# Patient Record
Sex: Female | Born: 1965
Health system: Southern US, Community
[De-identification: ages and names within clinical notes are randomized; demographics above are authoritative.]

## PROBLEM LIST (undated history)

## (undated) DIAGNOSIS — F419 Anxiety disorder, unspecified: Secondary | ICD-10-CM

## (undated) DIAGNOSIS — M1990 Unspecified osteoarthritis, unspecified site: Secondary | ICD-10-CM

## (undated) DIAGNOSIS — R519 Headache, unspecified: Secondary | ICD-10-CM

## (undated) DIAGNOSIS — J309 Allergic rhinitis, unspecified: Secondary | ICD-10-CM

## (undated) DIAGNOSIS — M549 Dorsalgia, unspecified: Secondary | ICD-10-CM

## (undated) DIAGNOSIS — G8929 Other chronic pain: Secondary | ICD-10-CM

## (undated) DIAGNOSIS — K649 Unspecified hemorrhoids: Secondary | ICD-10-CM

## (undated) DIAGNOSIS — IMO0002 Reserved for concepts with insufficient information to code with codable children: Secondary | ICD-10-CM

## (undated) DIAGNOSIS — R51 Headache: Secondary | ICD-10-CM

## (undated) DIAGNOSIS — R112 Nausea with vomiting, unspecified: Secondary | ICD-10-CM

## (undated) DIAGNOSIS — R030 Elevated blood-pressure reading, without diagnosis of hypertension: Secondary | ICD-10-CM

## (undated) DIAGNOSIS — M2550 Pain in unspecified joint: Secondary | ICD-10-CM

## (undated) DIAGNOSIS — E079 Disorder of thyroid, unspecified: Secondary | ICD-10-CM

## (undated) DIAGNOSIS — J302 Other seasonal allergic rhinitis: Secondary | ICD-10-CM

## (undated) DIAGNOSIS — J329 Chronic sinusitis, unspecified: Secondary | ICD-10-CM

## (undated) DIAGNOSIS — Z9889 Other specified postprocedural states: Secondary | ICD-10-CM

## (undated) DIAGNOSIS — I10 Essential (primary) hypertension: Secondary | ICD-10-CM

## (undated) DIAGNOSIS — R011 Cardiac murmur, unspecified: Secondary | ICD-10-CM

## (undated) DIAGNOSIS — IMO0001 Reserved for inherently not codable concepts without codable children: Secondary | ICD-10-CM

## (undated) DIAGNOSIS — M810 Age-related osteoporosis without current pathological fracture: Secondary | ICD-10-CM

## (undated) DIAGNOSIS — T7840XA Allergy, unspecified, initial encounter: Secondary | ICD-10-CM

## (undated) HISTORY — DX: Essential (primary) hypertension: I10

## (undated) HISTORY — DX: Allergic rhinitis, unspecified: J30.9

## (undated) HISTORY — DX: Anxiety disorder, unspecified: F41.9

## (undated) HISTORY — DX: Reserved for inherently not codable concepts without codable children: IMO0001

## (undated) HISTORY — DX: Age-related osteoporosis without current pathological fracture: M81.0

## (undated) HISTORY — DX: Disorder of thyroid, unspecified: E07.9

## (undated) HISTORY — DX: Chronic sinusitis, unspecified: J32.9

## (undated) HISTORY — DX: Allergy, unspecified, initial encounter: T78.40XA

## (undated) HISTORY — DX: Elevated blood-pressure reading, without diagnosis of hypertension: R03.0

---
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
## 1998-04-15 ENCOUNTER — Ambulatory Visit (HOSPITAL_COMMUNITY): Admission: RE | Admit: 1998-04-15 | Discharge: 1998-04-15 | Payer: Self-pay | Admitting: Neurosurgery

## 1999-01-26 ENCOUNTER — Other Ambulatory Visit: Admission: RE | Admit: 1999-01-26 | Discharge: 1999-01-26 | Payer: Self-pay | Admitting: Obstetrics and Gynecology

## 2000-01-25 ENCOUNTER — Other Ambulatory Visit: Admission: RE | Admit: 2000-01-25 | Discharge: 2000-01-25 | Payer: Self-pay | Admitting: Obstetrics and Gynecology

## 2000-08-14 ENCOUNTER — Encounter: Admission: RE | Admit: 2000-08-14 | Discharge: 2000-09-20 | Payer: Self-pay | Admitting: Occupational Medicine

## 2001-02-11 ENCOUNTER — Other Ambulatory Visit: Admission: RE | Admit: 2001-02-11 | Discharge: 2001-02-11 | Payer: Self-pay | Admitting: Obstetrics and Gynecology

## 2001-06-19 ENCOUNTER — Encounter: Payer: Self-pay | Admitting: Obstetrics and Gynecology

## 2001-06-19 ENCOUNTER — Encounter: Admission: RE | Admit: 2001-06-19 | Discharge: 2001-06-19 | Payer: Self-pay | Admitting: Obstetrics and Gynecology

## 2002-03-12 ENCOUNTER — Other Ambulatory Visit: Admission: RE | Admit: 2002-03-12 | Discharge: 2002-03-12 | Payer: Self-pay | Admitting: Obstetrics and Gynecology

## 2003-04-13 ENCOUNTER — Other Ambulatory Visit: Admission: RE | Admit: 2003-04-13 | Discharge: 2003-04-13 | Payer: Self-pay | Admitting: Obstetrics and Gynecology

## 2003-08-18 ENCOUNTER — Encounter: Payer: Self-pay | Admitting: Internal Medicine

## 2003-08-18 ENCOUNTER — Encounter: Admission: RE | Admit: 2003-08-18 | Discharge: 2003-08-18 | Payer: Self-pay | Admitting: Internal Medicine

## 2004-05-25 ENCOUNTER — Other Ambulatory Visit: Admission: RE | Admit: 2004-05-25 | Discharge: 2004-05-25 | Payer: Self-pay | Admitting: Obstetrics and Gynecology

## 2005-06-20 ENCOUNTER — Other Ambulatory Visit: Admission: RE | Admit: 2005-06-20 | Discharge: 2005-06-20 | Payer: Self-pay | Admitting: Obstetrics and Gynecology

## 2006-05-05 ENCOUNTER — Emergency Department (HOSPITAL_COMMUNITY): Admission: EM | Admit: 2006-05-05 | Discharge: 2006-05-05 | Payer: Self-pay | Admitting: Family Medicine

## 2007-06-23 ENCOUNTER — Ambulatory Visit: Payer: Self-pay | Admitting: Cardiology

## 2007-06-25 ENCOUNTER — Ambulatory Visit: Payer: Self-pay

## 2007-06-25 ENCOUNTER — Encounter: Payer: Self-pay | Admitting: Cardiology

## 2007-07-21 ENCOUNTER — Ambulatory Visit: Payer: Self-pay | Admitting: Cardiology

## 2008-02-24 ENCOUNTER — Emergency Department (HOSPITAL_COMMUNITY): Admission: EM | Admit: 2008-02-24 | Discharge: 2008-02-24 | Payer: Self-pay | Admitting: Family Medicine

## 2008-04-06 ENCOUNTER — Emergency Department (HOSPITAL_COMMUNITY): Admission: EM | Admit: 2008-04-06 | Discharge: 2008-04-06 | Payer: Self-pay | Admitting: Emergency Medicine

## 2008-04-15 ENCOUNTER — Encounter: Admission: RE | Admit: 2008-04-15 | Discharge: 2008-04-15 | Payer: Self-pay | Admitting: Internal Medicine

## 2010-10-27 ENCOUNTER — Ambulatory Visit: Payer: Self-pay | Admitting: Family Medicine

## 2010-10-27 ENCOUNTER — Telehealth (INDEPENDENT_AMBULATORY_CARE_PROVIDER_SITE_OTHER): Payer: Self-pay | Admitting: *Deleted

## 2010-10-27 DIAGNOSIS — J309 Allergic rhinitis, unspecified: Secondary | ICD-10-CM | POA: Insufficient documentation

## 2010-12-07 NOTE — Assessment & Plan Note (Signed)
Summary: ?SINUS INFECTION/JBB   Vital Signs:  Patient Profile:   45 Years Old Female CC:      Possible Sinus Infection Height:     63.25 inches Weight:      264 pounds BMI:     46.56 O2 Sat:      98 % O2 treatment:    Room Air Temp:     98.1 degrees F oral Pulse rate:   106 / minute Pulse rhythm:   regular Resp:     18 per minute BP sitting:   141 / 99  (right arm)  Pt. in pain?   yes    Location:   Generalized    Intensity:   4    Type:       aching  Vitals Entered By: Levonne Spiller EMT-P (October 27, 2010 12:09 PM)              Is Patient Diabetic? No  Does patient need assistance? Functional Status Self care Ambulation Normal      Current Allergies: ! * SEASONALHistory of Present Illness History from: patient Reason for visit: see chief complaint Chief Complaint: Possible Sinus Infection History of Present Illness: The patient is presenting today because she has been having a severe reaction of her allergies with the high pollen counts in the air.  She has allergies all year long.   The patient says that she is having sinus headaches (frontal and maxillary), postnasal sinus drainage and no fever.  She says that she has had a history of having frequent sinus infections.  She says that she is having sore throat and it has caused her to wake up in the morning. She is having postnasal drainage that has contributed to the sore throat.  She denies abdominal pain. She denies productive cough but has had a mild dry cough spurred by sneezing and runny nose.   She is taking her Zyrtec medication and also taking her Nasocort nasal spray on a daily basis.  The patient is reporting that she is being monitored for her blood pressure and that she has been having an elevated blood pressure but plans to see her [facility] he care provider next month to have it reevaluated. She reports that her PCP told her that she could do a trial of monitoring her sodium intake and losing weight to see if  this would help control her blood pressure. She denies having headaches and chest pain and visual changes. She denies having weakness in the extremities.  REVIEW OF SYSTEMS Constitutional Symptoms       Complains of fatigue.     Denies fever, chills, night sweats, weight loss, and weight gain.  Eyes       Denies change in vision, eye pain, eye discharge, glasses, contact lenses, and eye surgery. Ear/Nose/Throat/Mouth       Complains of frequent runny nose, sinus problems, and sore throat.      Denies hearing loss/aids, change in hearing, ear pain, ear discharge, dizziness, frequent nose bleeds, hoarseness, and tooth pain or bleeding.  Respiratory       Complains of dry cough.      Denies productive cough, wheezing, shortness of breath, asthma, bronchitis, and emphysema/COPD.  Cardiovascular       Denies murmurs, chest pain, and tires easily with exhertion.    Gastrointestinal       Denies stomach pain, nausea/vomiting, diarrhea, constipation, blood in bowel movements, and indigestion. Genitourniary       Denies painful urination,  kidney stones, and loss of urinary control. Neurological       Denies paralysis, seizures, and fainting/blackouts. Musculoskeletal       Denies muscle pain, joint pain, joint stiffness, decreased range of motion, redness, swelling, muscle weakness, and gout.  Skin       Denies bruising, unusual mles/lumps or sores, and hair/skin or nail changes.  Psych       Denies mood changes, temper/anger issues, anxiety/stress, speech problems, depression, and sleep problems.  Past History:  Past Medical History: Chronic-severe Year Long Allergic rhinitis History of frequent sinus infections History of elevated blood pressure Oral contraceptive use  Past Surgical History: Cesarean section x2 in 1994 and 1998  Family History: The patient reports that her father is alive and healthy. She reports that her father is deceased at the age of 72 from a myocardial  infarction and coronary artery disease. He also suffered with diabetes mellitus. The pace reports that her brother is 66 years old and suffering with diabetes mellitus. The patient also reports that she has been evaluated for elevated blood pressures and may be diagnosed with hypertension when she is seen by her primary care provider in January 2012.  Social History: The patient reports no history of tobacco, alcohol or recreational drug use. 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 Physical Exam General appearance: well developed, well nourished, no acute distress, cooperative and pleasant Head: normocephalic, atraumatic Eyes: conjunctivae and lids normal, bilateral Dennie's lines noted Pupils: equal, round, reactive to light Ears: normal, no lesions or deformities, no fluid seen Nasal: bilateral swollen nasal turbinates with erythematous and yellow discharge seen. Oral/Pharynx: dry mucous membranes, tongue normal, posterior pharynx without erythema or exudate Neck: neck supple,  trachea midline, no masses, no cervical adenopathy noted Thyroid: no masses noted no nodules noted. Chest/Lungs: no rales, wheezes, or rhonchi bilateral, breath sounds equal without effort Heart: regular rate and  rhythm, no murmur Abdomen: soft, non-tender without obvious organomegaly Extremities: normal extremities Neurological: grossly intact and non-focal Skin: no obvious rashes or lesions MSE: oriented to time, place, and person Assessment Problems:    Assessed ALLERGIC RHINITIS as deteriorated - Clanford Johnson MD New Problems: ELEVATED BP READING WITHOUT DX HYPERTENSION (ICD-796.2) ? of ACUTE SINUSITIS, UNSPECIFIED (ICD-461.9) ALLERGIC RHINITIS (ICD-477.9)   Patient Education: Patient and/or caregiver instructed in the following: rest, fluids, Tylenol prn. The risks, benefits and possible side effects were clearly explained and discussed with the patient.   The patient verbalized clear understanding.  The patient was given instructions to return if symptoms don't improve, worsen or new changes develop.  If it is not during clinic hours and the patient cannot get back to this clinic then the patient was told to seek medical care at an available urgent care or emergency department.  The patient verbalized understanding.   Demonstrates willingness to comply.  Plan New Medications/Changes: AZITHROMYCIN 250 MG TABS (AZITHROMYCIN) take 2 tabs by mouth on day 1, then 1 tab by mouth daily for 4 days  #6 x 0, 10/27/2010, Standley Dakins MD  Planning Comments:   The patient was given an injection of Depo-Medrol 80 mg in the office today. The patient tolerated the injection very well.  I have strongly encouraged the patient to continue monitoring her blood pressure. I told the patient to be sure and record the blood pressure readings and report them to her primary care provider when she follows up next month. The patient verbalized understanding.  In addition, I recommended that she consider returning to have her blood pressure in our office done at any time that the clinic is open. Also, I would like for the patient to discontinue the use of decongestants as this may contribute to her elevating her blood pressure. The patient verbalized understanding.  in addition, I explained to the patient that she may or may not have an active sinus infection at this time she does have symptoms and if she develops a purulent discharge from the nares or throat and worsening sinus headaches and a bad taste in the mouth then she can go ahead and get the prescription filled for the Z-Pak. Otherwise she would not need to get it filled. Most of these infections resolve without treatment within 10 days. The patient verbalized understanding.  Follow Up: Follow up in 2-3 days if no improvement, Follow up on an as needed basis, Follow up with Primary Physician  The patient and/or caregiver  has been counseled thoroughly with regard to medications prescribed including dosage, schedule, interactions, rationale for use, and possible side effects and they verbalize understanding.  Diagnoses and expected course of recovery discussed and will return if not improved as expected or if the condition worsens. Patient and/or caregiver verbalized understanding.  Prescriptions: AZITHROMYCIN 250 MG TABS (AZITHROMYCIN) take 2 tabs by mouth on day 1, then 1 tab by mouth daily for 4 days  #6 x 0   Entered and Authorized by:   Standley Dakins MD   Signed by:   Standley Dakins MD on 10/27/2010   Method used:   Electronically to        [id number]       [address]       [place], [place]  [address]       Ph: [phone]       Fax: [phone]   RxID:   [id number]   Patient Instructions: 1)  Oral Rehydration Solution: drink 1/2 ounce every 15 minutes. If tolerated afert 1 hour, drink 1 ounce every 15 minutes. As you can tolerate, keep adding 1/2 ounce every 15 minutes, up to a total of 2-4 ounces. Contact the office if unable to tolerate oral solution, if you keep vomiting, or you continue to have signs of dehydration. 2)  Take your antibiotic as prescribed until ALL of it is gone, but stop if you develop a rash or swelling and contact our office as soon as possible. 3)  Acute sinusitis symptoms for less than 10 days are not helped by antibiotics.Use warm moist compresses, and over the counter decongestants ( only as directed). Call if no improvement in 5-7 days, sooner if increasing pain, fever, or new symptoms. 4)  The patient was informed that there is no on-call provider or services available at this clinic during off-hours (when the clinic is closed).  If the patient developed a problem or concern that required immediate attention, the patient was advised to go the the nearest available urgent care or emergency department for medical care.  The patient verbalized  understanding.    5)  Please return if symptoms worsen or don't improve or new changes develop. We will be in the office tomorrow but will be closed Sunday and Monday. We will probably open at 11 AM on Tuesday, November 27.   Medication Administration  Injection # 1:    Medication: Depo- Medrol 80mg     Diagnosis: 477.9    Route: IM    Site: R deltoid  Exp Date: 06/05/2011    Lot #: [id number]    Mfr: PIFZER    Patient tolerated injection without complications    Given by: Levonne Spiller EMT-P (October 27, 2010 12:32 PM)

## 2010-12-07 NOTE — Progress Notes (Signed)
  Phone Note Call from Patient   Summary of Call: The patient was seen earlier today and Dr. Laural Benes called in a [facility] and it is not there.  [phone] Initial call taken by: Rosine Beat,  October 27, 2010 2:52 PM  Follow-up for Phone Call        [facility] to confirming receipt of a Rx for our clinic. They confirmed receipt and filled the Rx. Pt. was contacted with the same information. Follow-up by: Levonne Spiller EMT-P,  October 27, 2010 3:59 PM

## 2011-03-20 NOTE — Assessment & Plan Note (Signed)
[facility] OFFICE NOTE   NAME:Carson, Lindsey JANVIER                         MRN:          [id number]  DATE:06/23/2007                            DOB:          [birth year]    I was asked by Dr. Theressa Millard to consult on Lindsey Carson, a delightful  45 year old married white female, neuro intensive care unit nurse at  [employer], for an episode of chest pressure and 2 episodes of  palpitations on June 23, 2007.   She has never had a previous cardiac history.  While driving on the road  she had a brief episode of fluttering in her chest that lasted a few  seconds.  She felt a little lightheaded.  She did not experience  syncope.  The palpitations felt irregular.   These are not associated with any activity, in fact this is the first  time she has ever had them.   She does walk in her neighborhood at night.  She does not notice any  dyspnea on exertion or any angina.  She has had an episode of some chest  pressure which was nonexertional.  It is fairly brief.  She is concerned  because her father had a coronary event at age 21 and expired.  He did  have diabetes and hypertension.  He also had atrial fibrillation.   PAST MEDICAL HISTORY:  SHE IS CURRENTLY NOT ALLERGIC TO ANY MEDICATIONS.   She is on:  1. Yaz birth control pill daily.  2. Zyrtec 10 mg a day.  3. Nasacort q.a.m.   She does drink one caffeinated beverage a day.  Her lipids were checked  a year ago and were normal by Dr. Earl Gala.  She does exercise with  walking several nights a week.   SURGICAL HISTORY:  1. Two C-sections 1994 and 1998.  2. She has had wisdom teeth extracted in 1986.   FAMILY HISTORY:  Other than the above is negative.   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  She works weekends.  She is married and has 2 children.  She is under some stress.   EXAMINATION:  Blood pressure is 110/78, her pulse is 74 and regular, her  weight is 153.1, she is 5 foot 3.  She is in no acute distress.  Skin is  warm and dry.  Respiratory rate 18 and unlabored.  HEENT:  Unremarkable.  Carotid upstrokes were equal bilaterally without  bruits, there is no JVD.  Thyroid is not enlarged, trachea is midline.  LUNGS:  Clear.  CARDIAC:  PMI is nondisplaced, slightly accentuated S1, normal S2, there  is no murmurs.  ABDOMINAL:  Soft, good bowel sounds, no midline bruit.  There is no  hepatomegaly.  EXTREMITIES:  Reveal no edema, pulses are intact.  NEURO:  Intact.  SKIN:  Unremarkable.   EKG shows normal sinus rhythm with a borderline short PR interval 124  milliseconds.  There is no delta wave.  She does have some ST segment  elevation and T wave inversion in  V1 and V2 with what appears to be a  borderline incomplete right bundle.  Her QTC is normal.   ASSESSMENT:  1. Palpitations, these are spontaneous, short lived, and appear      irregular.  I suspect these are premature atrial contractions or      premature ventricular contractions.  She could have short runs of      supraventricular tachycardia.  Her PR interval is borderline short,      but this is probably not a factor.  2. Chest pressure which is noncardiac in my opinion.   PLAN:  1. A 2D echocardiogram to rule out any structural heart disease.  2. CardioNet monitor.     Thomas C. Daleen Squibb, MD, [facility]  Electronically Signed    TCW/MedQ  DD: 06/23/2007  DT: 06/23/2007  Job #: [id number]   cc:   Theressa Millard, M.D.

## 2011-04-20 ENCOUNTER — Encounter: Payer: Self-pay | Admitting: Cardiovascular Disease

## 2011-08-02 LAB — DIFFERENTIAL
Lymphocytes Relative: 20
Lymphs Abs: 1.3
Neutrophils Relative %: 66

## 2011-08-02 LAB — URINE MICROSCOPIC-ADD ON

## 2011-08-02 LAB — POCT I-STAT, CHEM 8
BUN: 10
Creatinine, Ser: 1
Potassium: 4
Sodium: 138

## 2011-08-02 LAB — URINALYSIS, ROUTINE W REFLEX MICROSCOPIC
Bilirubin Urine: NEGATIVE
Ketones, ur: NEGATIVE
Nitrite: NEGATIVE
Protein, ur: NEGATIVE
Urobilinogen, UA: 0.2

## 2011-08-02 LAB — GC/CHLAMYDIA PROBE AMP, GENITAL
Chlamydia, DNA Probe: NEGATIVE
GC Probe Amp, Genital: NEGATIVE

## 2011-08-02 LAB — POCT PREGNANCY, URINE
Operator id: 27532
Preg Test, Ur: NEGATIVE

## 2011-08-02 LAB — WET PREP, GENITAL
Clue Cells Wet Prep HPF POC: NONE SEEN
Trich, Wet Prep: NONE SEEN

## 2011-08-02 LAB — CBC
HCT: 39.9
Platelets: 224
WBC: 6.3

## 2011-12-28 ENCOUNTER — Other Ambulatory Visit: Payer: Self-pay | Admitting: Obstetrics and Gynecology

## 2011-12-28 DIAGNOSIS — R928 Other abnormal and inconclusive findings on diagnostic imaging of breast: Secondary | ICD-10-CM

## 2011-12-31 ENCOUNTER — Other Ambulatory Visit: Payer: Self-pay | Admitting: Obstetrics and Gynecology

## 2011-12-31 ENCOUNTER — Other Ambulatory Visit: Payer: Self-pay | Admitting: Family Medicine

## 2012-01-10 ENCOUNTER — Ambulatory Visit
Admission: RE | Admit: 2012-01-10 | Discharge: 2012-01-10 | Disposition: A | Discharge status: Home / Self Care | Payer: 59 | Source: Ambulatory Visit | Attending: Obstetrics and Gynecology | Admitting: Obstetrics and Gynecology

## 2012-01-10 DIAGNOSIS — R928 Other abnormal and inconclusive findings on diagnostic imaging of breast: Secondary | ICD-10-CM

## 2012-07-09 ENCOUNTER — Other Ambulatory Visit (HOSPITAL_COMMUNITY): Payer: Self-pay | Admitting: Neurosurgery

## 2012-07-09 DIAGNOSIS — M545 Low back pain: Secondary | ICD-10-CM

## 2012-07-10 ENCOUNTER — Ambulatory Visit (HOSPITAL_COMMUNITY)
Admission: RE | Admit: 2012-07-10 | Discharge: 2012-07-10 | Disposition: A | Discharge status: Home / Self Care | Payer: 59 | Source: Ambulatory Visit | Attending: Neurosurgery | Admitting: Neurosurgery

## 2012-07-10 DIAGNOSIS — M545 Low back pain: Secondary | ICD-10-CM

## 2012-07-10 DIAGNOSIS — M5126 Other intervertebral disc displacement, lumbar region: Secondary | ICD-10-CM | POA: Insufficient documentation

## 2012-07-18 ENCOUNTER — Other Ambulatory Visit: Payer: Self-pay | Admitting: Neurosurgery

## 2012-07-25 ENCOUNTER — Observation Stay (HOSPITAL_COMMUNITY)
Admission: EM | Admit: 2012-07-25 | Discharge: 2012-07-25 | Disposition: A | Discharge status: Home / Self Care | Payer: 59 | Attending: Emergency Medicine | Admitting: Emergency Medicine

## 2012-07-25 ENCOUNTER — Observation Stay (HOSPITAL_COMMUNITY): Payer: 59

## 2012-07-25 ENCOUNTER — Encounter (HOSPITAL_COMMUNITY): Payer: Self-pay | Admitting: *Deleted

## 2012-07-25 DIAGNOSIS — M79609 Pain in unspecified limb: Secondary | ICD-10-CM | POA: Insufficient documentation

## 2012-07-25 DIAGNOSIS — M25569 Pain in unspecified knee: Secondary | ICD-10-CM | POA: Insufficient documentation

## 2012-07-25 DIAGNOSIS — G8929 Other chronic pain: Secondary | ICD-10-CM | POA: Insufficient documentation

## 2012-07-25 DIAGNOSIS — R262 Difficulty in walking, not elsewhere classified: Secondary | ICD-10-CM | POA: Insufficient documentation

## 2012-07-25 DIAGNOSIS — M549 Dorsalgia, unspecified: Principal | ICD-10-CM | POA: Insufficient documentation

## 2012-07-25 HISTORY — DX: Reserved for concepts with insufficient information to code with codable children: IMO0002

## 2012-07-25 MED ORDER — ONDANSETRON HCL 4 MG/2ML IJ SOLN: 4.0000 mg | Freq: Four times a day (QID) | INTRAMUSCULAR | Status: DC | PRN

## 2012-07-25 MED ORDER — HYDROMORPHONE HCL PF 1 MG/ML IJ SOLN: 1.0000 mg | Freq: Four times a day (QID) | INTRAMUSCULAR | Status: DC | PRN

## 2012-07-25 MED ORDER — DIAZEPAM 5 MG/ML IJ SOLN: 5.0000 mg | Freq: Four times a day (QID) | INTRAMUSCULAR | Status: DC | PRN

## 2012-07-25 MED ORDER — KETOROLAC TROMETHAMINE 30 MG/ML IJ SOLN
30.0000 mg | Freq: Four times a day (QID) | INTRAMUSCULAR | Status: DC | PRN
  Administered 2012-07-25: 30 mg via INTRAVENOUS
  Filled 2012-07-25: qty 1

## 2012-07-25 MED ORDER — ACETAMINOPHEN 325 MG PO TABS
650.0000 mg | ORAL_TABLET | ORAL | Status: DC | PRN
  Administered 2012-07-25: 650 mg via ORAL
  Filled 2012-07-25: qty 2

## 2012-07-25 NOTE — ED Provider Notes (Signed)
830 am pt alert , ambulatory, gcs 15, appears in NAD. Plan f/u dr Dutch Quint as out pt. Aleve . suggest bp recheck 3 weeks  Doug Sou, MD 07/25/12 [phone]

## 2012-07-25 NOTE — ED Notes (Signed)
Pt has L5/S1 fracture, on OR schedule for November.  Woke at 2 am this morning with extreme back and knee pain.  Took Aleve and Ultram at 3 with some relief.

## 2012-07-25 NOTE — ED Notes (Signed)
Brief reports was given to Damon, RN in CDU.

## 2012-07-25 NOTE — ED Notes (Signed)
Neurosurgery has been in to see pt .

## 2012-07-25 NOTE — Consult Note (Signed)
46 year old female well-known to me. He follows for a focal left-sided L5-S1 disc herniation with resultant left-sided radiculopathy. She has been managed conservatively to date but is on the schedule for microdiscectomy in early November. In order to keep working and functioning the patient was recently prescribed her second steroid Dosepak. She stopped taking these on Wednesday. This morning around 2 AM she awakened from sleep with the acute onset of severe bilateral knee pain. The pain was not associated with any numbness or weakness. She had no other joints which were hurting. Her back pain was moderately worse her radicular symptoms in her left buttocks and posterior thigh were unchanged. She tried taking Aleve but got no immediate relief. She presented to the emergency room for further evaluation and care. Over the past few hours the pain has subsided completely. Her knees now feel normal. She has a persistent left buttocks and lower extremity pain which is unchanged. Her back pain is reasonably well controlled. She's been up and Lipitor he and has been able to void.  On exam she is awake and alert she is oriented and appropriate. She does not appear toxic. She is afebrile. Supple. Straight leg raising is moderately positive on the left negative on the right. Motor examination reveals trace weakness of her left-sided gastric venous muscle otherwise motor strength intact. Deep tendon versus are normal active. There is no evidence of long track signs. Chest and abdomen benign.  Patient had a followup MRI scan early this morning. This is completely unchanged from her previous scan and once again demonstrates a small leftward L5-S1 disc herniation. There is no evidence of infection hematoma or new disc rupture within either her lower thoracic spine or lumbar spine.  No evidence that her knee pain was directly coming from her L5-S1 disc herniation. The only supposition I would have at this point is that she  suffered acute inflammatory arthropathy of involving her knees secondary to steroid withdrawal. At this point out like a manager with nonsteroidal anti-inflammatory medications and I have advised her to stay on Aleve 2 pills twice a day for the next 2 weeks. I feel that she is okay for discharge home. She will call me if she is any new difficulty.

## 2012-07-25 NOTE — ED Provider Notes (Signed)
History     CSN: [id number]  Arrival date & time 07/25/12  0400   First MD Initiated Contact with Patient 07/25/12 [phone]      Chief Complaint  Patient presents with  . Back Pain  . Knee Pain    (Consider location/radiation/quality/duration/timing/severity/associated sxs/prior treatment) HPI HX per PT.  LBP for the last few months followed by [facility] Dr Dutch Quint and has planned surgical intervention in November.  Has been able to continue working with pain.  Pain is mostly L sided and has associated L foot numbness intermittently. No known recent trauma or bending/ twisting.    Woke around 2 am with radiating pain to bilateral groin and bilateral knees. Pain much more severe than her chronic pain.  No h/o symptoms like this. Initially unable to walk due to pain.  Called [facility] on call and discussed this with Dr Gerlene Fee who recommended that she come to the ED and have another MRI.  PT took home meds and presents here with pain not as severe as it was initially. No F/C, no urinary/ bowel incontinence. No rash, muscle spasm, weakness or numbness.  Past Medical History  Diagnosis Date  . Chronic allergic rhinitis     Severe, year long  . Sinus infection     History of frequent  . Elevated blood pressure     History of  . OCP (oral contraceptive pills) initiation     Use  . Spinal fracture     L5/S1    Past Surgical History  Procedure Date  . Cesarean section 1994 and 1998    x2    Family History  Problem Relation Age of Onset  . Heart attack Father     MI  . Coronary artery disease Father   . Diabetes Father   . Diabetes Brother     History  Substance Use Topics  . Smoking status: Never Smoker   . Smokeless tobacco: Not on file  . Alcohol Use: No    OB History    Grav Para Term Preterm Abortions TAB SAB Ect Mult Living                  Review of Systems  Constitutional: Negative for fever and chills.  HENT: Negative for neck pain and neck stiffness.   Eyes: Negative  for pain.  Respiratory: Negative for shortness of breath.   Cardiovascular: Negative for chest pain.  Gastrointestinal: Negative for abdominal pain.  Genitourinary: Negative for dysuria.  Musculoskeletal: Positive for back pain.  Skin: Negative for rash.  Neurological: Negative for headaches.  All other systems reviewed and are negative.    Allergies  Review of patient's allergies indicates no known allergies.  Home Medications   Current Outpatient Rx  Name Route Sig Dispense Refill  . CETIRIZINE HCL 10 MG PO TABS Oral Take 10 mg by mouth daily.      Marland Kitchen NAPROXEN SODIUM 220 MG PO TABS Oral Take 660 mg by mouth 2 (two) times daily with a meal.     . NORETHIN ACE-ETH ESTRAD-FE 1-20 MG-MCG(24) PO TABS Oral Take 1 tablet by mouth daily.      Marland Kitchen RANITIDINE HCL 150 MG PO TABS Oral Take 150 mg by mouth 2 (two) times daily.    . TRAMADOL HCL 50 MG PO TABS Oral Take 50 mg by mouth every 6 (six) hours as needed.    . TRIAMCINOLONE ACETONIDE 55 MCG/ACT NA INHA Nasal Place 2 sprays into the nose daily.  BP 162/96  Pulse 113  Temp 99.3 F (37.4 C) (Oral)  Resp 20  SpO2 97%  Physical Exam  Nursing note and vitals reviewed. Constitutional: She is oriented to person, place, and time. She appears well-developed and well-nourished.  HENT:  Head: Normocephalic and atraumatic.  Eyes: EOM are normal. Pupils are equal, round, and reactive to light.  Neck: Neck supple.  Cardiovascular: Regular rhythm, normal heart sounds and intact distal pulses.   Pulmonary/Chest: Effort normal. No respiratory distress.  Musculoskeletal: Normal range of motion. She exhibits no edema.       TTP L paralumbar, no midline defomity. Equal LE DTRs, sensorium to light touch and dorsi/ plantar flexion, hip flexion and knee extension.  Neurological: She is alert and oriented to person, place, and time.  Skin: Skin is warm and dry.    ED Course  Procedures (including critical care time)  MRI for severe LBP  radiating to groin and BLEs.   Back pain protocol initiated.   7:35 AM MRI pending and PT care Tx to [facility].   MDM          Sunnie Nielsen, MD 07/25/12 [phone]

## 2012-07-25 NOTE — ED Notes (Signed)
Patient states that she would like to wait before taking the Valium and Dilaudid. Patient is now in route to MRI.

## 2012-08-28 ENCOUNTER — Encounter (HOSPITAL_COMMUNITY)
Admission: RE | Admit: 2012-08-28 | Discharge: 2012-08-28 | Disposition: A | Discharge status: Home / Self Care | Payer: 59 | Source: Ambulatory Visit | Attending: Neurosurgery | Admitting: Neurosurgery

## 2012-08-28 ENCOUNTER — Encounter (HOSPITAL_COMMUNITY): Payer: Self-pay

## 2012-08-28 HISTORY — DX: Cardiac murmur, unspecified: R01.1

## 2012-08-28 HISTORY — DX: Unspecified hemorrhoids: K64.9

## 2012-08-28 HISTORY — DX: Other chronic pain: G89.29

## 2012-08-28 HISTORY — DX: Dorsalgia, unspecified: M54.9

## 2012-08-28 HISTORY — DX: Other seasonal allergic rhinitis: J30.2

## 2012-08-28 HISTORY — DX: Pain in unspecified joint: M25.50

## 2012-08-28 LAB — BASIC METABOLIC PANEL
BUN: 10 mg/dL (ref 6–23)
Chloride: 105 mEq/L (ref 96–112)
GFR calc Af Amer: >90 mL/min (ref >90)
Potassium: 3.9 mEq/L (ref 3.5–5.1)
Sodium: 139 mEq/L (ref 135–145)

## 2012-08-28 LAB — SURGICAL PCR SCREEN
MRSA, PCR: NEGATIVE
Staphylococcus aureus: NEGATIVE

## 2012-08-28 LAB — CBC WITH DIFFERENTIAL/PLATELET
Basophils Absolute: 0 10*3/uL (ref 0.0–0.1)
Eosinophils Absolute: 0.1 10*3/uL (ref 0.0–0.7)
Eosinophils Relative: 1 % (ref 0–5)
HCT: 40.8 % (ref 36.0–46.0)
MCH: 30.3 pg (ref 26.0–34.0)
MCHC: 33.8 g/dL (ref 30.0–36.0)
MCV: 89.5 fL (ref 78.0–100.0)
Monocytes Absolute: 0.6 10*3/uL (ref 0.1–1.0)
Platelets: 194 10*3/uL (ref 150–400)
RDW: 13 % (ref 11.5–15.5)

## 2012-08-28 LAB — HCG, SERUM, QUALITATIVE: Preg, Serum: NEGATIVE

## 2012-08-28 LAB — TYPE AND SCREEN: ABO/RH(D): O POS

## 2012-08-28 LAB — ABO/RH: ABO/RH(D): O POS

## 2012-08-28 NOTE — Progress Notes (Addendum)
Saw Dr.Wall 81yrs ago for palpitations  Echo in epic from 2008 Denies ever having a heart cath or stress test  Dr.Osborne with [facility]  Denies ekg or cxr within last yr

## 2012-08-28 NOTE — Pre-Procedure Instructions (Signed)
20 Lindsey Carson  08/28/2012   Your procedure is scheduled on:  Mon, Nov 4 @ 7:30 AM  Report to [facility] at 5:30 AM.  Call this number if you have problems the morning of surgery: [phone]   Remember:   Do not eat food:After Midnight.    Take these medicines the morning of surgery with A SIP OF WATER: Cetirizine(Zyrtec),Ranitidine(Zantac),Tramadol(Ultram),and Triamcinolone(Nasocort)   Do not wear jewelry, make-up or nail polish.  Do not wear lotions, powders, or perfumes. You may wear deodorant.  Do not shave 48 hours prior to surgery.   Do not bring valuables to the hospital.  Contacts, dentures or bridgework may not be worn into surgery.  Leave suitcase in the car. After surgery it may be brought to your room.  For patients admitted to the hospital, checkout time is 11:00 AM the day of discharge.   Patients discharged the day of surgery will not be allowed to drive home.    Special Instructions: Shower using CHG 2 nights before surgery and the night before surgery.  If you shower the day of surgery use CHG.  Use special wash - you have one bottle of CHG for all showers.  You should use approximately 1/3 of the bottle for each shower.   Please read over the following fact sheets that you were given: Pain Booklet, Coughing and Deep Breathing, Blood Transfusion Information, MRSA Information and Surgical Site Infection Prevention

## 2012-09-07 MED ORDER — CEFAZOLIN SODIUM-DEXTROSE 2-3 GM-% IV SOLR
2.0000 g | INTRAVENOUS | Status: AC
  Administered 2012-09-08: 2 g via INTRAVENOUS
  Filled 2012-09-07: qty 50

## 2012-09-08 ENCOUNTER — Encounter (HOSPITAL_COMMUNITY): Payer: Self-pay | Admitting: Anesthesiology

## 2012-09-08 ENCOUNTER — Encounter (HOSPITAL_COMMUNITY)
Admission: RE | Disposition: A | Discharge status: Home / Self Care | Payer: Self-pay | Source: Ambulatory Visit | Attending: Neurosurgery

## 2012-09-08 ENCOUNTER — Encounter (HOSPITAL_COMMUNITY): Payer: Self-pay | Admitting: *Deleted

## 2012-09-08 ENCOUNTER — Ambulatory Visit (HOSPITAL_COMMUNITY)
Admission: RE | Admit: 2012-09-08 | Discharge: 2012-09-08 | Disposition: A | Discharge status: Home / Self Care | Payer: 59 | Source: Ambulatory Visit | Attending: Neurosurgery | Admitting: Neurosurgery

## 2012-09-08 ENCOUNTER — Ambulatory Visit (HOSPITAL_COMMUNITY): Payer: 59

## 2012-09-08 ENCOUNTER — Ambulatory Visit (HOSPITAL_COMMUNITY): Payer: 59 | Admitting: Anesthesiology

## 2012-09-08 DIAGNOSIS — Z01811 Encounter for preprocedural respiratory examination: Secondary | ICD-10-CM | POA: Insufficient documentation

## 2012-09-08 DIAGNOSIS — M5126 Other intervertebral disc displacement, lumbar region: Secondary | ICD-10-CM | POA: Insufficient documentation

## 2012-09-08 DIAGNOSIS — M5116 Intervertebral disc disorders with radiculopathy, lumbar region: Secondary | ICD-10-CM | POA: Diagnosis present

## 2012-09-08 DIAGNOSIS — Z01812 Encounter for preprocedural laboratory examination: Secondary | ICD-10-CM | POA: Insufficient documentation

## 2012-09-08 HISTORY — PX: LUMBAR LAMINECTOMY/DECOMPRESSION MICRODISCECTOMY: SHX5026

## 2012-09-08 SURGERY — LUMBAR LAMINECTOMY/DECOMPRESSION MICRODISCECTOMY 1 LEVEL
Anesthesia: General | Site: Spine Lumbar | Laterality: Left | Wound class: Clean

## 2012-09-08 MED ORDER — ZOLPIDEM TARTRATE 5 MG PO TABS: 5.0000 mg | ORAL_TABLET | Freq: Every evening | ORAL | Status: DC | PRN

## 2012-09-08 MED ORDER — OXYCODONE-ACETAMINOPHEN 5-325 MG PO TABS
1.0000 | ORAL_TABLET | ORAL | Status: DC | PRN
  Administered 2012-09-08: 1 via ORAL
  Filled 2012-09-08: qty 1

## 2012-09-08 MED ORDER — SODIUM CHLORIDE 0.9 % IJ SOLN: 3.0000 mL | INTRAMUSCULAR | Status: DC | PRN

## 2012-09-08 MED ORDER — LACTATED RINGERS IV SOLN
INTRAVENOUS | Status: DC | PRN
  Administered 2012-09-08 (×2): via INTRAVENOUS

## 2012-09-08 MED ORDER — LIDOCAINE HCL (CARDIAC) 20 MG/ML IV SOLN
INTRAVENOUS | Status: DC | PRN
  Administered 2012-09-08: 50 mg via INTRAVENOUS

## 2012-09-08 MED ORDER — PHENYLEPHRINE HCL 10 MG/ML IJ SOLN
INTRAMUSCULAR | Status: DC | PRN
  Administered 2012-09-08: 80 ug via INTRAVENOUS
  Administered 2012-09-08 (×3): 40 ug via INTRAVENOUS

## 2012-09-08 MED ORDER — ONDANSETRON HCL 4 MG/2ML IJ SOLN
INTRAMUSCULAR | Status: DC | PRN
  Administered 2012-09-08: 4 mg via INTRAVENOUS

## 2012-09-08 MED ORDER — FLUTICASONE PROPIONATE 50 MCG/ACT NA SUSP
1.0000 | Freq: Every day | NASAL | Status: DC
  Filled 2012-09-08: qty 16

## 2012-09-08 MED ORDER — HYDROMORPHONE HCL PF 1 MG/ML IJ SOLN
INTRAMUSCULAR | Status: AC
  Filled 2012-09-08: qty 1

## 2012-09-08 MED ORDER — HYDROCODONE-ACETAMINOPHEN 5-325 MG PO TABS: 1.0000 | ORAL_TABLET | ORAL | Status: DC | PRN

## 2012-09-08 MED ORDER — CYCLOBENZAPRINE HCL 10 MG PO TABS: 10.0000 mg | ORAL_TABLET | Freq: Three times a day (TID) | ORAL | Status: DC | PRN

## 2012-09-08 MED ORDER — ONDANSETRON HCL 4 MG/2ML IJ SOLN
INTRAMUSCULAR | Status: AC
  Filled 2012-09-08: qty 2

## 2012-09-08 MED ORDER — FENTANYL CITRATE 0.05 MG/ML IJ SOLN
INTRAMUSCULAR | Status: DC | PRN
  Administered 2012-09-08: 100 ug via INTRAVENOUS
  Administered 2012-09-08 (×2): 50 ug via INTRAVENOUS
  Administered 2012-09-08 (×2): 25 ug via INTRAVENOUS

## 2012-09-08 MED ORDER — KETOROLAC TROMETHAMINE 30 MG/ML IJ SOLN
30.0000 mg | Freq: Four times a day (QID) | INTRAMUSCULAR | Status: DC
  Administered 2012-09-08: 30 mg via INTRAVENOUS
  Filled 2012-09-08: qty 1

## 2012-09-08 MED ORDER — ACETAMINOPHEN 10 MG/ML IV SOLN
INTRAVENOUS | Status: AC
  Filled 2012-09-08: qty 100

## 2012-09-08 MED ORDER — HEMOSTATIC AGENTS (NO CHARGE) OPTIME
TOPICAL | Status: DC | PRN
  Administered 2012-09-08: 1 via TOPICAL

## 2012-09-08 MED ORDER — DEXAMETHASONE SODIUM PHOSPHATE 10 MG/ML IJ SOLN
10.0000 mg | INTRAMUSCULAR | Status: AC
  Administered 2012-09-08: 10 mg via INTRAVENOUS

## 2012-09-08 MED ORDER — NEOSTIGMINE METHYLSULFATE 1 MG/ML IJ SOLN
INTRAMUSCULAR | Status: DC | PRN
  Administered 2012-09-08: 1 mg via INTRAVENOUS
  Administered 2012-09-08: 4 mg via INTRAVENOUS

## 2012-09-08 MED ORDER — ARTIFICIAL TEARS OP OINT
TOPICAL_OINTMENT | OPHTHALMIC | Status: DC | PRN
  Administered 2012-09-08: 1 via OPHTHALMIC

## 2012-09-08 MED ORDER — NORETHIN ACE-ETH ESTRAD-FE 1-20 MG-MCG(24) PO TABS: 1.0000 | ORAL_TABLET | Freq: Every day | ORAL | Status: DC

## 2012-09-08 MED ORDER — ACETAMINOPHEN 10 MG/ML IV SOLN
INTRAVENOUS | Status: DC | PRN
  Administered 2012-09-08: 1000 mg via INTRAVENOUS

## 2012-09-08 MED ORDER — HYDROMORPHONE HCL PF 1 MG/ML IJ SOLN: 0.5000 mg | INTRAMUSCULAR | Status: DC | PRN

## 2012-09-08 MED ORDER — SENNA 8.6 MG PO TABS: 1.0000 | ORAL_TABLET | Freq: Two times a day (BID) | ORAL | Status: DC

## 2012-09-08 MED ORDER — THROMBIN 5000 UNITS EX KIT
PACK | CUTANEOUS | Status: DC | PRN
  Administered 2012-09-08 (×2): 5000 [IU] via TOPICAL

## 2012-09-08 MED ORDER — BUPIVACAINE HCL (PF) 0.25 % IJ SOLN
INTRAMUSCULAR | Status: DC | PRN
  Administered 2012-09-08: 20 mL

## 2012-09-08 MED ORDER — SODIUM CHLORIDE 0.9 % IV SOLN: 250.0000 mL | INTRAVENOUS | Status: DC

## 2012-09-08 MED ORDER — ONDANSETRON HCL 4 MG/2ML IJ SOLN
4.0000 mg | INTRAMUSCULAR | Status: DC | PRN
  Administered 2012-09-08: 4 mg via INTRAVENOUS
  Filled 2012-09-08: qty 2

## 2012-09-08 MED ORDER — GLYCOPYRROLATE 0.2 MG/ML IJ SOLN
INTRAMUSCULAR | Status: DC | PRN
  Administered 2012-09-08: .6 mg via INTRAVENOUS

## 2012-09-08 MED ORDER — ACETAMINOPHEN 325 MG PO TABS: 650.0000 mg | ORAL_TABLET | ORAL | Status: DC | PRN

## 2012-09-08 MED ORDER — ACETAMINOPHEN 650 MG RE SUPP: 650.0000 mg | RECTAL | Status: DC | PRN

## 2012-09-08 MED ORDER — 0.9 % SODIUM CHLORIDE (POUR BTL) OPTIME
TOPICAL | Status: DC | PRN
  Administered 2012-09-08: 1000 mL

## 2012-09-08 MED ORDER — ROCURONIUM BROMIDE 100 MG/10ML IV SOLN
INTRAVENOUS | Status: DC | PRN
  Administered 2012-09-08: 40 mg via INTRAVENOUS

## 2012-09-08 MED ORDER — PHENOL 1.4 % MT LIQD: 1.0000 | OROMUCOSAL | Status: DC | PRN

## 2012-09-08 MED ORDER — CEFAZOLIN SODIUM 1-5 GM-% IV SOLN
1.0000 g | Freq: Three times a day (TID) | INTRAVENOUS | Status: DC
  Administered 2012-09-08: 1 g via INTRAVENOUS
  Filled 2012-09-08 (×2): qty 50

## 2012-09-08 MED ORDER — ONDANSETRON HCL 4 MG/2ML IJ SOLN: 4.0000 mg | Freq: Once | INTRAMUSCULAR | Status: DC | PRN

## 2012-09-08 MED ORDER — ALUM & MAG HYDROXIDE-SIMETH 200-200-20 MG/5ML PO SUSP: 30.0000 mL | Freq: Four times a day (QID) | ORAL | Status: DC | PRN

## 2012-09-08 MED ORDER — SODIUM CHLORIDE 0.9 % IR SOLN
Status: DC | PRN
  Administered 2012-09-08: 09:00:00

## 2012-09-08 MED ORDER — DEXAMETHASONE SODIUM PHOSPHATE 10 MG/ML IJ SOLN
INTRAMUSCULAR | Status: AC
  Filled 2012-09-08: qty 1

## 2012-09-08 MED ORDER — LIDOCAINE HCL 4 % MT SOLN
OROMUCOSAL | Status: DC | PRN
  Administered 2012-09-08: 4 mL via TOPICAL

## 2012-09-08 MED ORDER — MENTHOL 3 MG MT LOZG: 1.0000 | LOZENGE | OROMUCOSAL | Status: DC | PRN

## 2012-09-08 MED ORDER — HYDROMORPHONE HCL PF 1 MG/ML IJ SOLN
0.2500 mg | INTRAMUSCULAR | Status: DC | PRN
  Administered 2012-09-08: 0.25 mg via INTRAVENOUS

## 2012-09-08 MED ORDER — MIDAZOLAM HCL 5 MG/5ML IJ SOLN
INTRAMUSCULAR | Status: DC | PRN
  Administered 2012-09-08 (×2): 1 mg via INTRAVENOUS

## 2012-09-08 MED ORDER — SODIUM CHLORIDE 0.9 % IJ SOLN: 3.0000 mL | Freq: Two times a day (BID) | INTRAMUSCULAR | Status: DC

## 2012-09-08 MED ORDER — ACETAMINOPHEN 10 MG/ML IV SOLN: 1000.0000 mg | Freq: Once | INTRAVENOUS | Status: DC | PRN

## 2012-09-08 MED ORDER — SODIUM CHLORIDE 0.9 % IV SOLN
INTRAVENOUS | Status: AC
  Filled 2012-09-08: qty 500

## 2012-09-08 MED ORDER — BACITRACIN 50000 UNITS IM SOLR
INTRAMUSCULAR | Status: AC
  Filled 2012-09-08: qty 1

## 2012-09-08 MED ORDER — PROPOFOL 10 MG/ML IV BOLUS
INTRAVENOUS | Status: DC | PRN
  Administered 2012-09-08: 170 mg via INTRAVENOUS

## 2012-09-08 MED ORDER — KETOROLAC TROMETHAMINE 30 MG/ML IJ SOLN
INTRAMUSCULAR | Status: DC | PRN
  Administered 2012-09-08: 30 mg via INTRAVENOUS

## 2012-09-08 SURGICAL SUPPLY — 57 items
ADH SKN CLS APL DERMABOND .7 (GAUZE/BANDAGES/DRESSINGS)
ADH SKN CLS LQ APL DERMABOND (GAUZE/BANDAGES/DRESSINGS) ×1
APL SKNCLS STERI-STRIP NONHPOA (GAUZE/BANDAGES/DRESSINGS) ×1
BAG DECANTER FOR FLEXI CONT (MISCELLANEOUS) ×2 IMPLANT
BENZOIN TINCTURE PRP APPL 2/3 (GAUZE/BANDAGES/DRESSINGS) ×2 IMPLANT
BLADE SURG ROTATE 9660 (MISCELLANEOUS) IMPLANT
BRUSH SCRUB EZ PLAIN DRY (MISCELLANEOUS) ×2 IMPLANT
BUR CUTTER 7.0 ROUND (BURR) ×2 IMPLANT
CANISTER SUCTION 2500CC (MISCELLANEOUS) ×2 IMPLANT
CLOTH BEACON ORANGE TIMEOUT ST (SAFETY) ×2 IMPLANT
CONT SPEC 4OZ CLIKSEAL STRL BL (MISCELLANEOUS) ×2 IMPLANT
DECANTER SPIKE VIAL GLASS SM (MISCELLANEOUS) ×2 IMPLANT
DERMABOND ADHESIVE PROPEN (GAUZE/BANDAGES/DRESSINGS) ×1
DERMABOND ADVANCED (GAUZE/BANDAGES/DRESSINGS)
DERMABOND ADVANCED .7 DNX12 (GAUZE/BANDAGES/DRESSINGS) ×1 IMPLANT
DERMABOND ADVANCED .7 DNX6 (GAUZE/BANDAGES/DRESSINGS) IMPLANT
DRAPE LAPAROTOMY 100X72X124 (DRAPES) ×2 IMPLANT
DRAPE MICROSCOPE ZEISS OPMI (DRAPES) ×2 IMPLANT
DRAPE POUCH INSTRU U-SHP 10X18 (DRAPES) ×2 IMPLANT
DRAPE PROXIMA HALF (DRAPES) IMPLANT
DRAPE SURG 17X23 STRL (DRAPES) ×4 IMPLANT
ELECT REM PT RETURN 9FT ADLT (ELECTROSURGICAL) ×2
ELECTRODE REM PT RTRN 9FT ADLT (ELECTROSURGICAL) ×1 IMPLANT
GAUZE SPONGE 4X4 16PLY XRAY LF (GAUZE/BANDAGES/DRESSINGS) IMPLANT
GLOVE BIOGEL PI IND STRL 7.0 (GLOVE) IMPLANT
GLOVE BIOGEL PI IND STRL 8.5 (GLOVE) IMPLANT
GLOVE BIOGEL PI INDICATOR 7.0 (GLOVE) ×2
GLOVE BIOGEL PI INDICATOR 8.5 (GLOVE) ×1
GLOVE ECLIPSE 8.5 STRL (GLOVE) ×3 IMPLANT
GLOVE EXAM NITRILE LRG STRL (GLOVE) IMPLANT
GLOVE EXAM NITRILE MD LF STRL (GLOVE) ×1 IMPLANT
GLOVE EXAM NITRILE XL STR (GLOVE) IMPLANT
GLOVE EXAM NITRILE XS STR PU (GLOVE) IMPLANT
GLOVE SS BIOGEL STRL SZ 6.5 (GLOVE) IMPLANT
GLOVE SUPERSENSE BIOGEL SZ 6.5 (GLOVE) ×2
GOWN BRE IMP SLV AUR LG STRL (GOWN DISPOSABLE) ×1 IMPLANT
GOWN BRE IMP SLV AUR XL STRL (GOWN DISPOSABLE) ×3 IMPLANT
GOWN STRL REIN 2XL LVL4 (GOWN DISPOSABLE) IMPLANT
KIT BASIN OR (CUSTOM PROCEDURE TRAY) ×2 IMPLANT
KIT ROOM TURNOVER OR (KITS) ×2 IMPLANT
NDL SPNL 22GX3.5 QUINCKE BK (NEEDLE) ×1 IMPLANT
NEEDLE HYPO 22GX1.5 SAFETY (NEEDLE) ×2 IMPLANT
NEEDLE SPNL 22GX3.5 QUINCKE BK (NEEDLE) ×2 IMPLANT
NS IRRIG 1000ML POUR BTL (IV SOLUTION) ×2 IMPLANT
PACK LAMINECTOMY NEURO (CUSTOM PROCEDURE TRAY) ×2 IMPLANT
PAD ARMBOARD 7.5X6 YLW CONV (MISCELLANEOUS) ×8 IMPLANT
RUBBERBAND STERILE (MISCELLANEOUS) ×4 IMPLANT
SPONGE GAUZE 4X4 12PLY (GAUZE/BANDAGES/DRESSINGS) ×2 IMPLANT
SPONGE SURGIFOAM ABS GEL SZ50 (HEMOSTASIS) ×2 IMPLANT
STRIP CLOSURE SKIN 1/2X4 (GAUZE/BANDAGES/DRESSINGS) ×2 IMPLANT
SUT VIC AB 2-0 CT1 18 (SUTURE) ×2 IMPLANT
SUT VIC AB 3-0 SH 8-18 (SUTURE) ×2 IMPLANT
SYR 20ML ECCENTRIC (SYRINGE) ×2 IMPLANT
TAPE CLOTH SURG 4X10 WHT LF (GAUZE/BANDAGES/DRESSINGS) ×1 IMPLANT
TOWEL OR 17X24 6PK STRL BLUE (TOWEL DISPOSABLE) ×2 IMPLANT
TOWEL OR 17X26 10 PK STRL BLUE (TOWEL DISPOSABLE) ×2 IMPLANT
WATER STERILE IRR 1000ML POUR (IV SOLUTION) ×2 IMPLANT

## 2012-09-08 NOTE — Anesthesia Postprocedure Evaluation (Signed)
  Anesthesia Post-op Note  Patient: Lindsey Carson  Procedure(s) Performed: Procedure(s) (LRB) with comments: LUMBAR LAMINECTOMY/DECOMPRESSION MICRODISCECTOMY 1 LEVEL (Left) - left Lumbar Five-Sacral one laminectomy/microdiscectomy  Patient Location: PACU  Anesthesia Type:General  Level of Consciousness: awake, alert  and oriented  Airway and Oxygen Therapy: Patient Spontanous Breathing  Post-op Pain: mild  Post-op Assessment: Post-op Vital signs reviewed and Patient's Cardiovascular Status Stable  Post-op Vital Signs: stable  Complications: No apparent anesthesia complications 

## 2012-09-08 NOTE — H&P (Signed)
Lindsey Carson is an 46 y.o. female.   Chief Complaint: Back and left lower extremity pain HPI: 46 year old female with back and left lower extremity pain consistent with a left-sided S1 radiculopathy failing conservative management. Workup demonstrates evidence of a left-sided L5-S1 disc herniation with compression of the left-sided S1 nerve root and to lesser degree the left-sided L5 nerve root as it exited the foramen. Patient's failed conservative management including oral steroids rest and activity modifications. She has no right-sided symptoms. She has no bowel or bladder dysfunction. She presents now for microdiscectomy in hopes of improving her symptoms.  Past Medical History  Diagnosis Date  . Chronic allergic rhinitis     Severe, year long  . Sinus infection     History of frequent  . Elevated blood pressure     History of  . OCP (oral contraceptive pills) initiation     Use  . Spinal fracture     L5/S1  . Heart murmur     was told but doesn't require meds  . Seasonal allergies     uses nasocort daily and zyrtec  . Joint pain     hands and knees  . Chronic back pain     HNP  . Hemorrhoids     Past Surgical History  Procedure Date  . Cesarean section 1994 and 1998    x 2     Family History  Problem Relation Age of Onset  . Heart attack Father     MI  . Coronary artery disease Father   . Diabetes Father   . Diabetes Brother    Social History:  reports that she has never smoked. She does not have any smokeless tobacco history on file. She reports that she does not drink alcohol or use illicit drugs.  Allergies: No Known Allergies  Medications Prior to Admission  Medication Sig Dispense Refill  . cetirizine (ZYRTEC ALLERGY) 10 MG tablet Take 10 mg by mouth daily.        . Homeopathic Products (AZO CONFIDENCE PO) Take 1 tablet by mouth daily.      . Norethin Ace-Eth Estrad-FE (LOESTRIN 24 FE) 1-20 MG-MCG(24) TABS Take 1 tablet by mouth daily.        Marland Kitchen  triamcinolone (NASACORT AQ) 55 MCG/ACT nasal inhaler Place 2 sprays into the nose daily.          No results found for this or any previous visit (from the past 48 hour(s)). No results found.  Review of Systems  Constitutional: Negative.   HENT: Negative.   Eyes: Negative.   Respiratory: Negative.   Cardiovascular: Negative.   Gastrointestinal: Negative.   Genitourinary: Negative.   Musculoskeletal: Negative.   Skin: Negative.   Neurological: Negative.   Endo/Heme/Allergies: Negative.   Psychiatric/Behavioral: Negative.     Blood pressure 155/98, pulse 94, temperature 98.5 F (36.9 C), temperature source Oral, resp. rate 18, SpO2 98.00%. Physical Exam  Constitutional: She is oriented to person, place, and time. She appears well-developed and well-nourished. No distress.  HENT:  Head: Normocephalic and atraumatic.  Right Ear: External ear normal.  Left Ear: External ear normal.  Nose: Nose normal.  Mouth/Throat: Oropharynx is clear and moist.  Eyes: Conjunctivae normal and EOM are normal. Pupils are equal, round, and reactive to light. Right eye exhibits no discharge. Left eye exhibits no discharge.  Neck: Normal range of motion. Neck supple. No tracheal deviation present. No thyromegaly present.  Cardiovascular: Normal rate, regular rhythm, normal heart sounds and  intact distal pulses.  Exam reveals no friction rub.   No murmur heard. Respiratory: Effort normal and breath sounds normal. No respiratory distress. She has no wheezes.  GI: Soft. Bowel sounds are normal. She exhibits no distension. There is no tenderness.  Musculoskeletal: Normal range of motion. She exhibits no edema and no tenderness.  Neurological: She is alert and oriented to person, place, and time. She has normal reflexes. She displays normal reflexes. No cranial nerve deficit. She exhibits normal muscle tone. Coordination normal.       Positive left straight leg raise. Left gastrocnemius 4+ over 5 with mild  wasting. Absent left Achilles reflex.  Skin: Skin is warm and dry. No rash noted. She is not diaphoretic. No erythema. No pallor.  Psychiatric: She has a normal mood and affect. Her behavior is normal. Judgment and thought content normal.     Assessment/Plan Left L5-S1 herniated nucleus pulposus with radiculopathy. Plan left L5-S1 laminotomy and microdiscectomy. Risks and benefits have been explained. Patient wishes to proceed.  Lindsey Carson A 09/08/2012, 7:38 AM

## 2012-09-08 NOTE — Transfer of Care (Signed)
Immediate Anesthesia Transfer of Care Note  Patient: Lindsey Carson  Procedure(s) Performed: Procedure(s) (LRB) with comments: LUMBAR LAMINECTOMY/DECOMPRESSION MICRODISCECTOMY 1 LEVEL (Left) - left Lumbar Five-Sacral one laminectomy/microdiscectomy  Patient Location: PACU  Anesthesia Type:General  Level of Consciousness: awake  Airway & Oxygen Therapy: Patient Spontanous Breathing and Patient connected to nasal cannula oxygen  Post-op Assessment: Report given to PACU RN, Post -op Vital signs reviewed and stable and Patient moving all extremities  Post vital signs: Reviewed and stable  Complications: No apparent anesthesia complications

## 2012-09-08 NOTE — Op Note (Signed)
Date of procedure: 09/08/2012  Date of dictation: Same  Service: Neurosurgery  Preoperative diagnosis: Left L5-S1 herniated nucleus pulposus with radiculopathy  Postoperative diagnosis: Same  Procedure Name: Left L5-S1 laminotomy and microdiscectomy  Surgeon:Gearldean Lomanto A.Slayter Moorhouse, M.D.  Asst. Surgeon: Danielle Dess  Anesthesia: General  Indication: 46 year old female with a back and left lower extremity pain consistent with a left-sided S1 radiculopathy with some elements of the left-sided S1 radiculopathy who has failed conservative measure workup demonstrates evidence of a left-sided L5-S1 disc herniation with compression the left-sided S1 and L5 nerve roots. Patient presents now for microdiscectomy in hopes of improving her symptoms.  Operative note: After induction of anesthesia, patient positioned prone onto Wilson frame and appropriately padded. Lumbar region prepped and draped. Incision made overlying L5-S1 interspace. Subperiosteal dissection performed on the left side. Retractor placed. X-ray taken. Level confirmed. Laminotomy performed utilizing high-speed drill and Kerrison rongeurs. Underlying thecal sac and L5 and S1 nerve roots identified. Microscope brought field these were micro-section. L5 nerve root with a very low takeoff of. Epidural venous plexus coagulated and cut. Thecal sac and S1 nerve root gently mobilized. Disc herniation in the axilla of the L5 nerve root and compressing the proximal S1 nerve root identified. The space and size. Discectomy performed using pituitary rongeurs operative of pituitary rongeurs and Epstein curettes per almost the disc herniation resected. All loose are obvious he jammed his removed and interspace. At this point a very thorough discectomy been achieved. No injury to thecal sac or nerve roots. Wound is then irrigated out solution. Gelfoam was placed topically for hemostasis. Microscope and retractor system were removed. Hemostasis of the muscle achieved with  electrocautery. Wound closed in layers with Vicryl sutures. Steri-Strips sterile dressing applied. No apparent complications. Patient tolerated the procedure well and returned to recovery postop.

## 2012-09-08 NOTE — Discharge Summary (Signed)
Physician Discharge Summary  Patient ID: Lindsey Carson MRN: [id number] DOB/AGE: April 13, 1966 46 y.o.  Admit date: 09/08/2012 Discharge date: 09/08/2012  Admission Diagnoses:  Discharge Diagnoses:  Principal Problem:  *Lumbar disc herniation with radiculopathy   Discharged Condition: good  Hospital Course: Admitted to hospital where she underwent a lumbar laminotomy and microdiscectomy.Marland Kitchen Post op she did well with no pain.   Consults:   Significant Diagnostic Studies:   Treatments:   Discharge Exam: Blood pressure 116/88, pulse 82, temperature 97 F (36.1 C), temperature source Oral, resp. rate 16, SpO2 95.00%. Neuro intact Woudn c/d/i  Disposition: 01-Home or Self Care     Medication List     As of 09/08/2012  4:57 PM    TAKE these medications         AZO CONFIDENCE PO   Take 1 tablet by mouth daily.      cyclobenzaprine 10 MG tablet   Commonly known as: FLEXERIL   Take 1 tablet (10 mg total) by mouth 3 (three) times daily as needed for muscle spasms.      HYDROcodone-acetaminophen 5-325 MG per tablet   Commonly known as: NORCO/VICODIN   Take 1-2 tablets by mouth every 4 (four) hours as needed.      LOESTRIN 24 FE 1-20 MG-MCG(24) tablet   Generic drug: Norethindrone Acetate-Ethinyl Estrad-FE   Take 1 tablet by mouth daily.      NASACORT AQ 55 MCG/ACT nasal inhaler   Generic drug: triamcinolone   Place 2 sprays into the nose daily.      ZYRTEC ALLERGY 10 MG tablet   Generic drug: cetirizine   Take 10 mg by mouth daily.           Follow-up Information    Follow up with Trevor Wilkie A, MD. Call in 1 week.   Contact information:   [address] [place] [address] [phone]          Signed: Julio Sicks A 09/08/2012, 4:57 PM

## 2012-09-08 NOTE — Anesthesia Postprocedure Evaluation (Signed)
  Anesthesia Post-op Note  Patient: Lindsey Carson  Procedure(s) Performed: Procedure(s) (LRB) with comments: LUMBAR LAMINECTOMY/DECOMPRESSION MICRODISCECTOMY 1 LEVEL (Left) - left Lumbar Five-Sacral one laminectomy/microdiscectomy  Patient Location: PACU  Anesthesia Type:General  Level of Consciousness: awake, alert  and oriented  Airway and Oxygen Therapy: Patient Spontanous Breathing  Post-op Pain: mild  Post-op Assessment: Post-op Vital signs reviewed and Patient's Cardiovascular Status Stable  Post-op Vital Signs: stable  Complications: No apparent anesthesia complications

## 2012-09-08 NOTE — Brief Op Note (Signed)
09/08/2012  9:14 AM  PATIENT:  Lindsey Carson  46 y.o. female  PRE-OPERATIVE DIAGNOSIS:  HNP  POST-OPERATIVE DIAGNOSIS:  Herniated nucleous pulposus  PROCEDURE:  Procedure(s) (LRB) with comments: LUMBAR LAMINECTOMY/DECOMPRESSION MICRODISCECTOMY 1 LEVEL (Left) - left Lumbar Five-Sacral one laminectomy/microdiscectomy  SURGEON:  Surgeon(s) and Role:    * Temple Pacini, MD - Primary    * Barnett Abu, MD - Assisting  PHYSICIAN ASSISTANT:   ASSISTANTS:    ANESTHESIA:   general  EBL:  Total I/O In: 1000 [I.V.:1000] Out: -   BLOOD ADMINISTERED:none  DRAINS: none   LOCAL MEDICATIONS USED:  MARCAINE     SPECIMEN:  No Specimen  DISPOSITION OF SPECIMEN:  N/A  COUNTS:  YES  TOURNIQUET:  * No tourniquets in log *  DICTATION: .Dragon Dictation  PLAN OF CARE: Admit for overnight observation  PATIENT DISPOSITION:  PACU - hemodynamically stable.   Delay start of Pharmacological VTE agent (>24hrs) due to surgical blood loss or risk of bleeding: yes

## 2012-09-08 NOTE — Anesthesia Preprocedure Evaluation (Addendum)
Anesthesia Evaluation  Patient identified by MRN, date of birth, ID band Patient awake    Reviewed: Allergy & Precautions, H&P , NPO status , Patient's Chart, lab work & pertinent test results  Airway Mallampati: II TM Distance: >3 FB     Dental  (+) Teeth Intact   Pulmonary  breath sounds clear to auscultation        Cardiovascular Rhythm:Regular Rate:Normal     Neuro/Psych    GI/Hepatic   Endo/Other    Renal/GU      Musculoskeletal   Abdominal   Peds  Hematology   Anesthesia Other Findings   Reproductive/Obstetrics                           Anesthesia Physical Anesthesia Plan  ASA: II  Anesthesia Plan: General   Post-op Pain Management:    Induction: Intravenous  Airway Management Planned: Oral ETT  Additional Equipment:   Intra-op Plan:   Post-operative Plan: Extubation in OR  Informed Consent: I have reviewed the patients History and Physical, chart, labs and discussed the procedure including the risks, benefits and alternatives for the proposed anesthesia with the patient or authorized representative who has indicated his/her understanding and acceptance.     Plan Discussed with: CRNA and Surgeon  Anesthesia Plan Comments: (HNP L5-S1 Allergic Rhinitis  Plan GA with oral ETT  Kipp Brood, MD)       Anesthesia Quick Evaluation

## 2012-09-08 NOTE — Progress Notes (Signed)
Pt doing very well. Pt given D/C instructions with Rx's, verbal understanding given. Pt D/C'd home via wheelchair @ 1730 per MD order. Rema Fendt, RN

## 2012-09-08 NOTE — Anesthesia Procedure Notes (Signed)
Procedure Name: Intubation Date/Time: 09/08/2012 7:50 AM Performed by: Luster Landsberg Pre-anesthesia Checklist: Patient identified, Emergency Drugs available, Suction available, Patient being monitored and Timeout performed Patient Re-evaluated:Patient Re-evaluated prior to inductionOxygen Delivery Method: Circle system utilized Preoxygenation: Pre-oxygenation with 100% oxygen Intubation Type: IV induction Ventilation: Mask ventilation without difficulty Laryngoscope Size: Mac and 3 Grade View: Grade II Tube type: Oral Tube size: 7.0 mm Number of attempts: 1 Airway Equipment and Method: Stylet and LTA kit utilized Placement Confirmation: positive ETCO2,  ETT inserted through vocal cords under direct vision and breath sounds checked- equal and bilateral Secured at: 21 cm Tube secured with: Tape Dental Injury: Teeth and Oropharynx as per pre-operative assessment

## 2012-09-09 ENCOUNTER — Encounter (HOSPITAL_COMMUNITY): Payer: Self-pay | Admitting: Neurosurgery

## 2012-12-20 ENCOUNTER — Other Ambulatory Visit: Payer: Self-pay

## 2013-09-10 ENCOUNTER — Other Ambulatory Visit: Payer: Self-pay

## 2014-01-12 ENCOUNTER — Other Ambulatory Visit: Payer: Self-pay | Admitting: Obstetrics and Gynecology

## 2014-01-12 DIAGNOSIS — R928 Other abnormal and inconclusive findings on diagnostic imaging of breast: Secondary | ICD-10-CM

## 2014-01-29 ENCOUNTER — Ambulatory Visit
Admission: RE | Admit: 2014-01-29 | Discharge: 2014-01-29 | Disposition: A | Discharge status: Home / Self Care | Payer: 59 | Source: Ambulatory Visit | Attending: Obstetrics and Gynecology | Admitting: Obstetrics and Gynecology

## 2014-01-29 DIAGNOSIS — R928 Other abnormal and inconclusive findings on diagnostic imaging of breast: Secondary | ICD-10-CM

## 2014-04-07 ENCOUNTER — Other Ambulatory Visit (HOSPITAL_COMMUNITY): Payer: Self-pay | Admitting: Internal Medicine

## 2014-04-07 DIAGNOSIS — R519 Headache, unspecified: Secondary | ICD-10-CM

## 2014-04-07 DIAGNOSIS — R51 Headache: Principal | ICD-10-CM

## 2014-04-15 ENCOUNTER — Ambulatory Visit (HOSPITAL_COMMUNITY): Admission: RE | Admit: 2014-04-15 | Payer: 59 | Source: Ambulatory Visit

## 2014-04-15 ENCOUNTER — Ambulatory Visit (HOSPITAL_COMMUNITY)
Admission: RE | Admit: 2014-04-15 | Discharge: 2014-04-15 | Disposition: A | Discharge status: Home / Self Care | Payer: 59 | Source: Ambulatory Visit | Attending: Internal Medicine | Admitting: Internal Medicine

## 2014-04-15 DIAGNOSIS — R51 Headache: Secondary | ICD-10-CM | POA: Diagnosis present

## 2014-04-15 DIAGNOSIS — R519 Headache, unspecified: Secondary | ICD-10-CM

## 2014-08-20 ENCOUNTER — Other Ambulatory Visit: Payer: Self-pay

## 2015-09-02 ENCOUNTER — Telehealth: Payer: 59 | Admitting: Neurology

## 2015-09-02 DIAGNOSIS — B9689 Other specified bacterial agents as the cause of diseases classified elsewhere: Secondary | ICD-10-CM

## 2015-09-02 DIAGNOSIS — J019 Acute sinusitis, unspecified: Secondary | ICD-10-CM

## 2015-09-02 MED ORDER — AZITHROMYCIN 250 MG PO TABS: ORAL_TABLET | ORAL | Status: DC

## 2015-09-02 NOTE — Progress Notes (Signed)
We are sorry that you are not feeling well.  Here is how we plan to help!  Based on what you have shared with me it looks like you have sinusitis.  Sinusitis is inflammation and infection in the sinus cavities of the head.  Based on your presentation I believe you most likely have sinusitis. You may continue the medicines you are taking. A Zpack will be sent in electronically. Please allow enough time for the prescription to go through the system and for the pharmacy to fill it. If your medicine is not at the pharmacy after a few hours, please send a note back.  A "nedi pot" is also a good OTC tool to help with sinus congestion and pain. If you should not feel better, or if you get worse, please see your primary care doctor.   Sinus infections are not as easily transmitted as other respiratory infection, however we still recommend that you avoid close contact with loved ones, especially the very young and elderly.  Remember to wash your hands thoroughly throughout the day as this is the number one way to prevent the spread of infection!  Home Care:  Only take medications as instructed by your medical team.  Complete the entire course of an antibiotic.  Do not take these medications with alcohol.  A steam or ultrasonic humidifier can help congestion.  You can place a towel over your head and breathe in the steam from hot water coming from a faucet.  Avoid close contacts especially the very young and the elderly.  Cover your mouth when you cough or sneeze.  Always remember to wash your hands.  Get Help Right Away If:  You develop worsening fever or sinus pain.  You develop a severe head ache or visual changes.  Your symptoms persist after you have completed your treatment plan.  Make sure you  Understand these instructions.  Will watch your condition.  Will get help right away if you are not doing well or get worse.  Your e-visit answers were reviewed by a board certified  advanced clinical practitioner to complete your personal care plan.  Depending on the condition, your plan could have included both over the counter or prescription medications.  If there is a problem please reply  once you have received a response from your provider.  Your safety is important to [place].  If you have drug allergies check your prescription carefully.    You can use MyChart to ask questions about today's visit, request a non-urgent call back, or ask for a work or school excuse for 24 hours related to this e-Visit. If it has been greater than 24 hours you will need to follow up with your provider, or enter a new e-Visit to address those concerns.  You will get an e-mail in the next two days asking about your experience.  I hope that your e-visit has been valuable and will speed your recovery. Thank you for using e-visits.

## 2015-11-24 ENCOUNTER — Ambulatory Visit (INDEPENDENT_AMBULATORY_CARE_PROVIDER_SITE_OTHER): Payer: 59 | Admitting: Nurse Practitioner

## 2015-11-24 ENCOUNTER — Telehealth: Payer: Self-pay | Admitting: Nurse Practitioner

## 2015-11-24 ENCOUNTER — Encounter: Payer: Self-pay | Admitting: Nurse Practitioner

## 2015-11-24 VITALS — BP 112/74 | HR 77 | Temp 98.2°F | Resp 16 | Ht 63.5 in | Wt 168.8 lb

## 2015-11-24 DIAGNOSIS — Z7189 Other specified counseling: Secondary | ICD-10-CM | POA: Diagnosis not present

## 2015-11-24 DIAGNOSIS — R1013 Epigastric pain: Secondary | ICD-10-CM | POA: Diagnosis not present

## 2015-11-24 DIAGNOSIS — Z7689 Persons encountering health services in other specified circumstances: Secondary | ICD-10-CM

## 2015-11-24 LAB — COMPREHENSIVE METABOLIC PANEL
ALBUMIN: 3.8 g/dL (ref 3.5–5.2)
ALK PHOS: 62 U/L (ref 39–117)
ALT: 11 U/L (ref 0–35)
AST: 15 U/L (ref 0–37)
BUN: 18 mg/dL (ref 6–23)
CO2: 26 mEq/L (ref 19–32)
CREATININE: 0.87 mg/dL (ref 0.40–1.20)
Calcium: 9.4 mg/dL (ref 8.4–10.5)
Chloride: 107 mEq/L (ref 96–112)
GFR: 73.32 mL/min (ref >60.00)
Glucose, Bld: 100 mg/dL — ABNORMAL HIGH (ref 70–99)
POTASSIUM: 4 meq/L (ref 3.5–5.1)
Sodium: 140 mEq/L (ref 135–145)
Total Bilirubin: 0.4 mg/dL (ref 0.2–1.2)
Total Protein: 6.3 g/dL (ref 6.0–8.3)

## 2015-11-24 MED ORDER — LOSARTAN POTASSIUM 50 MG PO TABS: 50.0000 mg | ORAL_TABLET | Freq: Every day | ORAL | Status: DC

## 2015-11-24 NOTE — Telephone Encounter (Signed)
Based on provider note it was a Establish care note and not a phyiscal, so I would keep the physical scheduled.

## 2015-11-24 NOTE — Progress Notes (Signed)
Patient ID: Lindsey Carson, female    DOB: 29-Jun-1966  Age: 50 y.o. MRN: [id number]  CC: Establish Care; Nausea; and Abdominal Pain   HPI Lindsey Carson presents for establishing care and CC of epigastric pain and nausea x 2 months.   1) Pt reports 2 months of intermittent  waves of nausea 1 episode of throwing up 1st week of jan.  Gnawing sensation in epigastric area  No ulcer history, denies diarrhea or constipation, denies heart burn  No problems with fatty or fried foods   Treatment to date: Tums, prilosec- somewhat helpful   2) Chronic problems-  Depression- post-partum with only 1 of her 2 daughters, resolved  Allergies- seasonal  HTN- stable on medications   History Lindsey Carson has a past medical history of Chronic allergic rhinitis; Sinus infection; Elevated blood pressure; OCP (oral contraceptive pills) initiation; Spinal fracture; Heart murmur; Seasonal allergies; Joint pain; Chronic back pain; Hemorrhoids; Hypertension; and Depression.   She has past surgical history that includes Cesarean section (1994 and 1998) and Lumbar laminectomy/decompression microdiscectomy (09/08/2012).   Her family history includes Coronary artery disease in her father; Diabetes in her brother and father; Heart attack in her father.She reports that she has never smoked. She does not have any smokeless tobacco history on file. She reports that she does not drink alcohol or use illicit drugs.  Outpatient Prescriptions Prior to Visit  Medication Sig Dispense Refill  . cetirizine (ZYRTEC ALLERGY) 10 MG tablet Take 10 mg by mouth daily.      Marland Kitchen triamcinolone (NASACORT AQ) 55 MCG/ACT nasal inhaler Place 2 sprays into the nose daily.      . cyclobenzaprine (FLEXERIL) 10 MG tablet Take 1 tablet (10 mg total) by mouth 3 (three) times daily as needed for muscle spasms. (Patient not taking: Reported on 11/24/2015) 30 tablet 1  . Norethin Ace-Eth Estrad-FE (LOESTRIN 24 FE) 1-20 MG-MCG(24) TABS Take 1 tablet by mouth daily.  Reported on 11/24/2015    . azithromycin (ZITHROMAX) 250 MG tablet Take 2 pills on day one, then one a day on days 2-5. 6 each 0  . Homeopathic Products (AZO CONFIDENCE PO) Take 1 tablet by mouth daily.    Marland Kitchen HYDROcodone-acetaminophen (NORCO/VICODIN) 5-325 MG per tablet Take 1-2 tablets by mouth every 4 (four) hours as needed. (Patient not taking: Reported on 11/24/2015) 60 tablet 1   No facility-administered medications prior to visit.    ROS Review of Systems  Constitutional: Negative for fever, chills, diaphoresis and fatigue.  Respiratory: Negative for chest tightness, shortness of breath and wheezing.   Cardiovascular: Negative for chest pain, palpitations and leg swelling.  Gastrointestinal: Positive for nausea, vomiting and abdominal pain. Negative for diarrhea.       Epigastric  Skin: Negative for rash.  Neurological: Negative for dizziness and headaches.    Objective:  BP 112/74 mmHg  Pulse 77  Temp(Src) 98.2 F (36.8 C) (Oral)  Resp 16  Ht 5' 3.5" (1.613 m)  Wt 168 lb 12.8 oz (76.567 kg)  BMI 29.43 kg/m2  SpO2 97%  Physical Exam  Constitutional: She is oriented to person, place, and time. She appears well-developed and well-nourished. No distress.  HENT:  Head: Normocephalic and atraumatic.  Right Ear: External ear normal.  Left Ear: External ear normal.  Eyes: EOM are normal. Pupils are equal, round, and reactive to light. Right eye exhibits no discharge. Left eye exhibits no discharge. No scleral icterus.  Cardiovascular: Normal rate, regular rhythm and normal heart sounds.  Exam  reveals no gallop and no friction rub.   No murmur heard. Pulmonary/Chest: Effort normal and breath sounds normal. No respiratory distress. She has no wheezes. She has no rales. She exhibits no tenderness.  Abdominal: Soft. Bowel sounds are normal. She exhibits no distension and no mass. There is no tenderness. There is no rebound and no guarding.  Neurological: She is alert and oriented to  person, place, and time. No cranial nerve deficit. She exhibits normal muscle tone. Coordination normal.  Skin: Skin is warm and dry. No rash noted. She is not diaphoretic.  Psychiatric: She has a normal mood and affect. Her behavior is normal. Judgment and thought content normal.   Assessment & Plan:   Lindsey Carson was seen today for establish care, nausea and abdominal pain.  Diagnoses and all orders for this visit:  Abdominal pain, epigastric -     H. pylori breath test -     Comp Met (CMET)  Encounter to establish care  Other orders -     losartan (COZAAR) 50 MG tablet; Take 1 tablet (50 mg total) by mouth daily.   I have discontinued Lindsey Carson's Homeopathic Products (AZO CONFIDENCE PO), HYDROcodone-acetaminophen, and azithromycin. I have also changed her losartan. Additionally, I am having her maintain her Norethindrone Acetate-Ethinyl Estrad-FE, triamcinolone, cetirizine, cyclobenzaprine, LO LOESTRIN FE, vitamin B-12, and vitamin E.  Meds ordered this encounter  Medications  . DISCONTD: losartan (COZAAR) 50 MG tablet    Sig:     Refill:  5  . LO LOESTRIN FE 1 MG-10 MCG / 10 MCG tablet    Sig:     Refill:  10  . vitamin B-12 (CYANOCOBALAMIN) 1000 MCG tablet    Sig: Take 1,000 mcg by mouth daily.  . vitamin E 400 UNIT capsule    Sig: Take 400 Units by mouth daily.  Marland Kitchen losartan (COZAAR) 50 MG tablet    Sig: Take 1 tablet (50 mg total) by mouth daily.    Dispense:  30 tablet    Refill:  3    Order Specific Question:  Supervising Provider    Answer:  Crecencio Mc [[id number]]     Follow-up: Return if symptoms worsen or fail to improve.

## 2015-11-24 NOTE — Patient Instructions (Signed)
Welcome to [facility]! Nice to see you today.   Please go to the lab for the H.Pylori test and CMET lab draw.   Helicobacter Pylori Antibodies Test WHY AM I HAVING THIS TEST? This is a blood test that looks for bacteria called Helicobacter pylori (H. pylori). H. pylori is a germ that can be found in the cells that line the stomach. Having high levels of H. pylori in your stomach puts you at risk for stomach ulcers and small bowel ulcers, long-term (chronic) inflammation of the lining of the stomach, or even ulcers that may occur in the canal that runs from the mouth to the stomach (esophagus). Presence of H. pylori can also increase your risk for stomach cancer if it is left untreated. Most people with H. pylori in their stomach bacteria have no symptoms. Your health care provider may ask you to have this test if you have symptoms of a stomach ulcer or small bowel ulcer, such as stomach pain before or after eating, heartburn, or nausea repeatedly after eating. WHAT KIND OF SAMPLE IS TAKEN? A blood sample is required for this test. It is usually collected by inserting a needle into a vein or sticking a finger with a small needle. HOW DO I PREPARE FOR THE TEST? There is no preparation required for this test. WHAT ARE THE REFERENCE RANGES? Reference ranges are considered healthy ranges established after testing a large group of healthy people. Reference ranges may vary among different people, labs, and hospitals. It is your responsibility to obtain your test results. Ask the lab or department performing the test when and how you will get your results. Your test results will be reported as positive, negative, or equivocal. Equivocal means that your results are neither positive nor negative. References ranges for these results are as follows:  Less than or equal to 30 units/mL. This is negative.  Greater than or equal to 40 units/mL. This is positive.  30.01-39.99 units/mL. This is equivocal. WHAT DO  THE RESULTS MEAN? Test results that are higher than normal may indicate numerous health conditions. These may include:  Short-term or long-term irritation of the stomach lining (gastritis).  Small bowel ulcer.  Stomach ulcer.  Stomach cancer. Talk with your health care provider to discuss your results, treatment options, and if necessary, the need for more tests. Talk with your health care provider if you have any questions about your results.   This information is not intended to replace advice given to you by your health care provider. Make sure you discuss any questions you have with your health care provider.   Document Released: 11/15/2004 Document Revised: 11/12/2014 Document Reviewed: 03/05/2014 Elsevier Interactive Patient Education [employer].

## 2015-11-24 NOTE — Telephone Encounter (Signed)
Called pt to let her know that she needed to keep the physical appt.Lindsey Carson

## 2015-11-24 NOTE — Telephone Encounter (Signed)
Pt wanted to know if she needed to keep the 2/9 appt for a physical or was it done today.. Please advise pt.. Thanks

## 2015-11-25 DIAGNOSIS — Z7689 Persons encountering health services in other specified circumstances: Secondary | ICD-10-CM | POA: Insufficient documentation

## 2015-11-25 DIAGNOSIS — R1013 Epigastric pain: Secondary | ICD-10-CM | POA: Insufficient documentation

## 2015-11-25 LAB — H. PYLORI BREATH TEST: H. pylori Breath Test: NOT DETECTED

## 2015-11-25 NOTE — Assessment & Plan Note (Signed)
Discussed acute and chronic issues. Reviewed health maintenance measures, PFSHx, and immunizations. Obtain records from previous facility.   

## 2015-11-25 NOTE — Assessment & Plan Note (Signed)
New onset No findings on exam  History suggests gallbladder or possible ulcer etiology Will check a CMET and H. Pylori breath test today FU after results

## 2015-11-28 ENCOUNTER — Encounter: Payer: Self-pay | Admitting: Nurse Practitioner

## 2015-11-30 ENCOUNTER — Encounter: Payer: Self-pay | Admitting: Nurse Practitioner

## 2015-12-01 ENCOUNTER — Other Ambulatory Visit: Payer: Self-pay | Admitting: Nurse Practitioner

## 2015-12-01 DIAGNOSIS — R10816 Epigastric abdominal tenderness: Secondary | ICD-10-CM

## 2015-12-08 ENCOUNTER — Ambulatory Visit
Admission: RE | Admit: 2015-12-08 | Discharge: 2015-12-08 | Disposition: A | Discharge status: Home / Self Care | Payer: 59 | Source: Ambulatory Visit | Attending: Nurse Practitioner | Admitting: Nurse Practitioner

## 2015-12-08 DIAGNOSIS — L821 Other seborrheic keratosis: Secondary | ICD-10-CM | POA: Diagnosis not present

## 2015-12-08 DIAGNOSIS — K769 Liver disease, unspecified: Secondary | ICD-10-CM | POA: Diagnosis not present

## 2015-12-08 DIAGNOSIS — R10816 Epigastric abdominal tenderness: Secondary | ICD-10-CM | POA: Insufficient documentation

## 2015-12-08 DIAGNOSIS — R1013 Epigastric pain: Secondary | ICD-10-CM | POA: Diagnosis not present

## 2015-12-08 DIAGNOSIS — D2271 Melanocytic nevi of right lower limb, including hip: Secondary | ICD-10-CM | POA: Diagnosis not present

## 2015-12-08 DIAGNOSIS — L814 Other melanin hyperpigmentation: Secondary | ICD-10-CM | POA: Diagnosis not present

## 2015-12-08 DIAGNOSIS — D2239 Melanocytic nevi of other parts of face: Secondary | ICD-10-CM | POA: Diagnosis not present

## 2015-12-08 DIAGNOSIS — D2272 Melanocytic nevi of left lower limb, including hip: Secondary | ICD-10-CM | POA: Diagnosis not present

## 2015-12-08 DIAGNOSIS — D1801 Hemangioma of skin and subcutaneous tissue: Secondary | ICD-10-CM | POA: Diagnosis not present

## 2015-12-08 DIAGNOSIS — D225 Melanocytic nevi of trunk: Secondary | ICD-10-CM | POA: Diagnosis not present

## 2015-12-12 ENCOUNTER — Encounter: Payer: Self-pay | Admitting: Nurse Practitioner

## 2015-12-15 ENCOUNTER — Encounter: Payer: Self-pay | Admitting: Nurse Practitioner

## 2015-12-15 ENCOUNTER — Ambulatory Visit (INDEPENDENT_AMBULATORY_CARE_PROVIDER_SITE_OTHER): Payer: 59 | Admitting: Nurse Practitioner

## 2015-12-15 VITALS — BP 102/62 | HR 84 | Temp 98.2°F | Resp 12 | Ht 64.0 in | Wt 169.8 lb

## 2015-12-15 DIAGNOSIS — R1013 Epigastric pain: Secondary | ICD-10-CM

## 2015-12-15 DIAGNOSIS — Z0000 Encounter for general adult medical examination without abnormal findings: Secondary | ICD-10-CM

## 2015-12-15 DIAGNOSIS — L918 Other hypertrophic disorders of the skin: Secondary | ICD-10-CM | POA: Diagnosis not present

## 2015-12-15 NOTE — Progress Notes (Signed)
Pre visit review using our clinic review tool, if applicable. No additional management support is needed unless otherwise documented below in the visit note. 

## 2015-12-15 NOTE — Progress Notes (Signed)
Patient ID: GARY STICKNEY, female    DOB: [birth year]  Age: 50 y.o. MRN: [id number]  CC: CPE   HPI Lindsey Carson presents for annual exam and removal of skin tag.   1) Annual Physical   Diet- no formal  Exercise- no formal  Immunizations- up-to-date  Mammogram- done by OB/GYN  Colonoscopy- will be of age in April  Eye Exam- up-to-date  Dental Exam- up-to-date  LMP- taking birth control  Labs- will obtain  Fall- Neg.   Depression- Neg.  Refills: Denies need for  Skin tag- 40 sec. she is okay with freezing off due to somewhat large stalk  Brother and dad have hemochromatosis- discussed this with patient in regards to ultrasound findings of her liver.  History Lindsey Carson has a past medical history of Chronic allergic rhinitis; Sinus infection; Elevated blood pressure; OCP (oral contraceptive pills) initiation; Spinal fracture; Heart murmur; Seasonal allergies; Joint pain; Chronic back pain; Hemorrhoids; Hypertension; and Depression.   She has past surgical history that includes Cesarean section (1994 and 1998) and Lumbar laminectomy/decompression microdiscectomy (09/08/2012).   Her family history includes Coronary artery disease in her father; Diabetes in her brother and father; Heart attack in her father.She reports that she has never smoked. She does not have any smokeless tobacco history on file. She reports that she does not drink alcohol or use illicit drugs.  Outpatient Prescriptions Prior to Visit  Medication Sig Dispense Refill  . cetirizine (ZYRTEC ALLERGY) 10 MG tablet Take 10 mg by mouth daily.      Marland Kitchen losartan (COZAAR) 50 MG tablet Take 1 tablet (50 mg total) by mouth daily. 30 tablet 3  . Norethin Ace-Eth Estrad-FE (LOESTRIN 24 FE) 1-20 MG-MCG(24) TABS Take 1 tablet by mouth daily. Reported on 11/24/2015    . triamcinolone (NASACORT AQ) 55 MCG/ACT nasal inhaler Place 2 sprays into the nose daily.      . cyclobenzaprine (FLEXERIL) 10 MG tablet Take 1 tablet (10 mg total) by mouth  3 (three) times daily as needed for muscle spasms. (Patient not taking: Reported on 11/24/2015) 30 tablet 1  . LO LOESTRIN FE 1 MG-10 MCG / 10 MCG tablet   10  . vitamin B-12 (CYANOCOBALAMIN) 1000 MCG tablet Take 1,000 mcg by mouth daily.    . vitamin E 400 UNIT capsule Take 400 Units by mouth daily.     No facility-administered medications prior to visit.    ROS Review of Systems  Constitutional: Negative for fever, chills, diaphoresis, fatigue and unexpected weight change.  HENT: Negative for tinnitus and trouble swallowing.   Eyes: Negative for visual disturbance.  Respiratory: Negative for chest tightness, shortness of breath and wheezing.   Cardiovascular: Negative for chest pain, palpitations and leg swelling.  Gastrointestinal: Negative for nausea, vomiting, abdominal pain, diarrhea, constipation and blood in stool.  Endocrine: Negative for polydipsia, polyphagia and polyuria.  Genitourinary: Negative for dysuria, hematuria, vaginal discharge and vaginal pain.  Musculoskeletal: Negative for myalgias, back pain, arthralgias and gait problem.  Skin: Negative for color change and rash.       Skin tags under left breast  Neurological: Negative for dizziness, weakness, numbness and headaches.  Hematological: Does not bruise/bleed easily.  Psychiatric/Behavioral: Negative for suicidal ideas and sleep disturbance. The patient is not nervous/anxious.     Objective:  BP 102/62 mmHg  Pulse 84  Temp(Src) 98.2 F (36.8 C) (Oral)  Resp 12  Ht 5\' 4"  (1.626 m)  Wt 169 lb 12.8 oz (77.021 kg)  BMI 29.13  kg/m2  SpO2 97%  Physical Exam  Constitutional: She is oriented to person, place, and time. She appears well-developed and well-nourished. No distress.  HENT:  Head: Normocephalic and atraumatic.  Right Ear: External ear normal.  Left Ear: External ear normal.  Nose: Nose normal.  Mouth/Throat: Oropharynx is clear and moist. No oropharyngeal exudate.  TMs and canals clear bilaterally   Eyes: Conjunctivae and EOM are normal. Pupils are equal, round, and reactive to light. Right eye exhibits no discharge. Left eye exhibits no discharge. No scleral icterus.  Neck: Normal range of motion. Neck supple. No thyromegaly present.  Cardiovascular: Normal rate, regular rhythm, normal heart sounds and intact distal pulses.  Exam reveals no gallop and no friction rub.   No murmur heard. Pulmonary/Chest: Effort normal and breath sounds normal. No respiratory distress. She has no wheezes. She has no rales. She exhibits no tenderness.  Breast exam deferred to OB/GYN  Abdominal: Soft. Bowel sounds are normal. She exhibits no distension and no mass. There is no tenderness. There is no rebound and no guarding.  Genitourinary:  Pelvic exam deferred to OB/GYN  Musculoskeletal: Normal range of motion. She exhibits no edema or tenderness.  Lymphadenopathy:    She has no cervical adenopathy.  Neurological: She is alert and oriented to person, place, and time. She has normal reflexes. No cranial nerve deficit. She exhibits normal muscle tone. Coordination normal.  Skin: Skin is warm and dry. No rash noted. She is not diaphoretic. No erythema. No pallor.  Large irritated skin tag on the left rib area underneath breast  Psychiatric: She has a normal mood and affect. Her behavior is normal. Judgment and thought content normal.   Assessment & Plan:   Lindsey Carson was seen today for cpe.  Diagnoses and all orders for this visit:  Routine general medical examination at a health care facility -     CBC w/Diff; Future -     Comprehensive metabolic panel; Future -     Lipid panel; Future -     HgB A1c; Future  Skin tag  Abdominal pain, epigastric   I have discontinued Ms. Kontz's cyclobenzaprine, LO LOESTRIN FE, vitamin B-12, and vitamin E. I am also having her maintain her Norethindrone Acetate-Ethinyl Estrad-FE, triamcinolone, cetirizine, and losartan.  No orders of the defined types were placed in  this encounter.     Follow-up: Return if symptoms worsen or fail to improve, for Needs fasting lab appointment.

## 2015-12-15 NOTE — Patient Instructions (Signed)

## 2015-12-20 DIAGNOSIS — Z0000 Encounter for general adult medical examination without abnormal findings: Principal | ICD-10-CM

## 2015-12-20 DIAGNOSIS — L918 Other hypertrophic disorders of the skin: Secondary | ICD-10-CM | POA: Insufficient documentation

## 2015-12-20 DIAGNOSIS — Z0001 Encounter for general adult medical examination with abnormal findings: Secondary | ICD-10-CM | POA: Insufficient documentation

## 2015-12-20 NOTE — Assessment & Plan Note (Signed)
Discussed acute and chronic issues. Reviewed health maintenance measures, PFSHx, and immunizations. Obtain routine labs TSH, Lipid panel, CBC w/ diff, A1c, and CMET.   Health maintenance was made up-to-date

## 2015-12-20 NOTE — Assessment & Plan Note (Signed)
Discussed findings of ultrasound Patient has family history of hemachromatosis, but has never had any signs of it herself We discussed doing a HIDA scan which she will defer until she has an increase in symptoms We will follow

## 2015-12-20 NOTE — Assessment & Plan Note (Signed)
New problem Large irritated skin tag was froze with Histofreeze for 40 seconds advised patient this will fall off over the next few days Band-Aid was placed over site patient tolerated well will follow as needed

## 2015-12-22 DIAGNOSIS — L820 Inflamed seborrheic keratosis: Secondary | ICD-10-CM | POA: Diagnosis not present

## 2015-12-29 ENCOUNTER — Other Ambulatory Visit (INDEPENDENT_AMBULATORY_CARE_PROVIDER_SITE_OTHER): Payer: 59

## 2015-12-29 DIAGNOSIS — R7989 Other specified abnormal findings of blood chemistry: Secondary | ICD-10-CM

## 2015-12-29 DIAGNOSIS — Z0000 Encounter for general adult medical examination without abnormal findings: Secondary | ICD-10-CM | POA: Diagnosis not present

## 2015-12-29 LAB — HEMOGLOBIN A1C: HEMOGLOBIN A1C: 5.5 % (ref 4.6–6.5)

## 2015-12-29 LAB — COMPREHENSIVE METABOLIC PANEL
ALBUMIN: 4 g/dL (ref 3.5–5.2)
ALT: 12 U/L (ref 0–35)
AST: 13 U/L (ref 0–37)
Alkaline Phosphatase: 70 U/L (ref 39–117)
BUN: 18 mg/dL (ref 6–23)
CALCIUM: 10 mg/dL (ref 8.4–10.5)
CO2: 26 mEq/L (ref 19–32)
Chloride: 107 mEq/L (ref 96–112)
Creatinine, Ser: 0.81 mg/dL (ref 0.40–1.20)
GFR: 79.59 mL/min (ref >60.00)
GLUCOSE: 95 mg/dL (ref 70–99)
Potassium: 4.2 mEq/L (ref 3.5–5.1)
Sodium: 139 mEq/L (ref 135–145)
Total Bilirubin: 0.4 mg/dL (ref 0.2–1.2)
Total Protein: 6.4 g/dL (ref 6.0–8.3)

## 2015-12-29 LAB — CBC WITH DIFFERENTIAL/PLATELET
BASOS ABS: 0 10*3/uL (ref 0.0–0.1)
Basophils Relative: 0.5 % (ref 0.0–3.0)
EOS PCT: 2.4 % (ref 0.0–5.0)
Eosinophils Absolute: 0.2 10*3/uL (ref 0.0–0.7)
HEMATOCRIT: 39.9 % (ref 36.0–46.0)
HEMOGLOBIN: 13.5 g/dL (ref 12.0–15.0)
LYMPHS PCT: 20.1 % (ref 12.0–46.0)
Lymphs Abs: 1.4 10*3/uL (ref 0.7–4.0)
MCHC: 33.8 g/dL (ref 30.0–36.0)
MCV: 89.3 fl (ref 78.0–100.0)
MONOS PCT: 8.3 % (ref 3.0–12.0)
Monocytes Absolute: 0.6 10*3/uL (ref 0.1–1.0)
Neutro Abs: 4.9 10*3/uL (ref 1.4–7.7)
Neutrophils Relative %: 68.7 % (ref 43.0–77.0)
Platelets: 214 10*3/uL (ref 150.0–400.0)
RBC: 4.47 Mil/uL (ref 3.87–5.11)
RDW: 12.5 % (ref 11.5–15.5)
WBC: 7.1 10*3/uL (ref 4.0–10.5)

## 2015-12-29 LAB — LIPID PANEL
Cholesterol: 190 mg/dL (ref 0–200)
HDL: 40.4 mg/dL (ref >39.00)
NonHDL: 149.27
TRIGLYCERIDES: 241 mg/dL — AB (ref 0.0–149.0)
Total CHOL/HDL Ratio: 5
VLDL: 48.2 mg/dL — ABNORMAL HIGH (ref 0.0–40.0)

## 2015-12-29 LAB — LDL CHOLESTEROL, DIRECT: Direct LDL: 99 mg/dL

## 2016-01-19 DIAGNOSIS — Z1231 Encounter for screening mammogram for malignant neoplasm of breast: Secondary | ICD-10-CM | POA: Diagnosis not present

## 2016-01-19 DIAGNOSIS — M19011 Primary osteoarthritis, right shoulder: Secondary | ICD-10-CM | POA: Diagnosis not present

## 2016-01-19 DIAGNOSIS — M25511 Pain in right shoulder: Secondary | ICD-10-CM | POA: Diagnosis not present

## 2016-03-31 ENCOUNTER — Telehealth: Payer: 59 | Admitting: Nurse Practitioner

## 2016-03-31 DIAGNOSIS — J0101 Acute recurrent maxillary sinusitis: Secondary | ICD-10-CM

## 2016-03-31 MED ORDER — AZITHROMYCIN 250 MG PO TABS
ORAL_TABLET | ORAL | Status: DC
Start: 2016-03-31 — End: 2016-12-27

## 2016-03-31 NOTE — Progress Notes (Signed)

## 2016-05-03 ENCOUNTER — Telehealth: Payer: Self-pay | Admitting: Family Medicine

## 2016-05-03 MED ORDER — LOSARTAN POTASSIUM 50 MG PO TABS: 50.0000 mg | ORAL_TABLET | Freq: Every day | ORAL | Status: DC

## 2016-05-03 NOTE — Telephone Encounter (Signed)
Refilled. Spoke with patient.

## 2016-05-03 NOTE — Telephone Encounter (Signed)
Pt called stating she needs a refill for losartan (COZAAR) 50 MG tablet. Pt did switch to Dr Caryl Bis.   Pharmacy is [facility], [address]  Call pt @ [phone]. Thank you!

## 2016-07-15 IMAGING — US US ABDOMEN COMPLETE
1 series · 13 of 25 positions shown · non-contrast
Comparison: Patient has a history of prior ultrasound 04/15/2008 CT
04/06/2008. These are unavailable for review.

CLINICAL DATA: Epigastric tenderness.

EXAM:
ABDOMEN ULTRASOUND COMPLETE

[Series 1: us abdomen complete · 0.19mm/px · 13 of 114 slices shown]
[im 1/114]
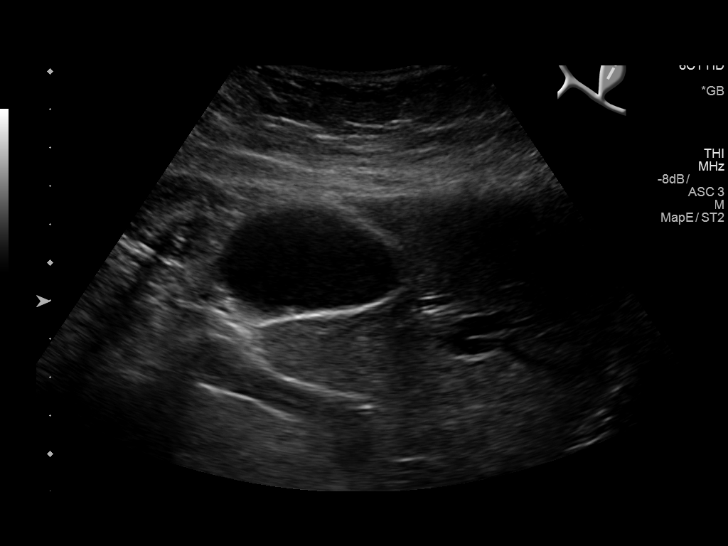
[im 10/114]
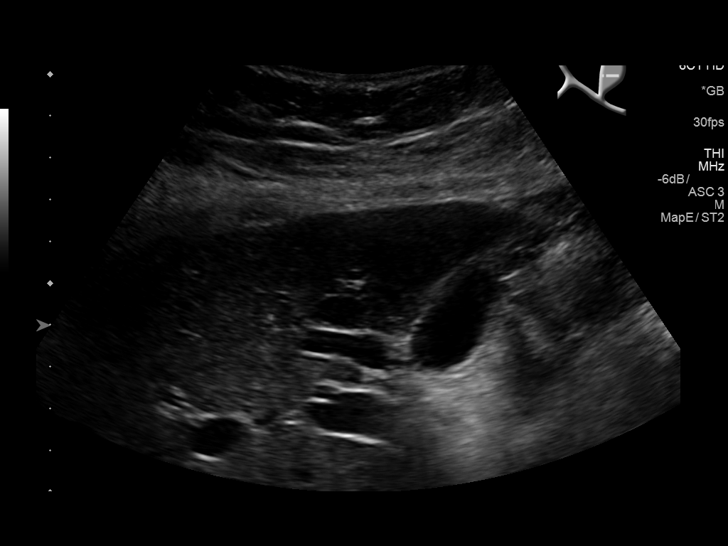
[im 19/114]
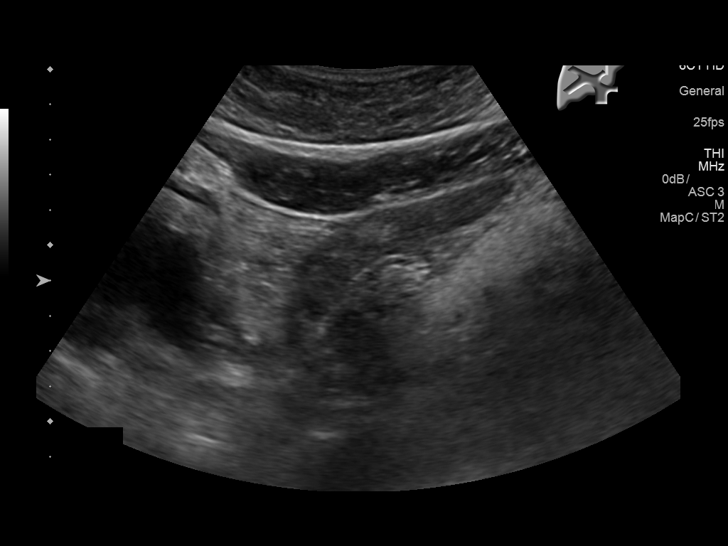
[im 29/114]
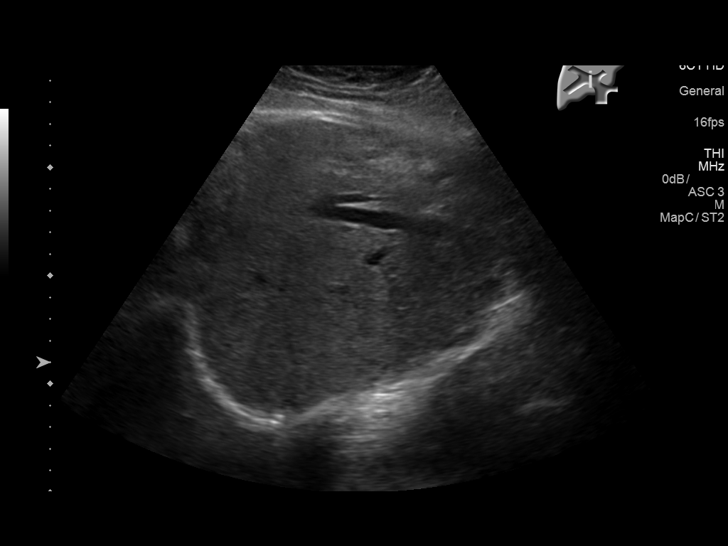
[im 38/114]
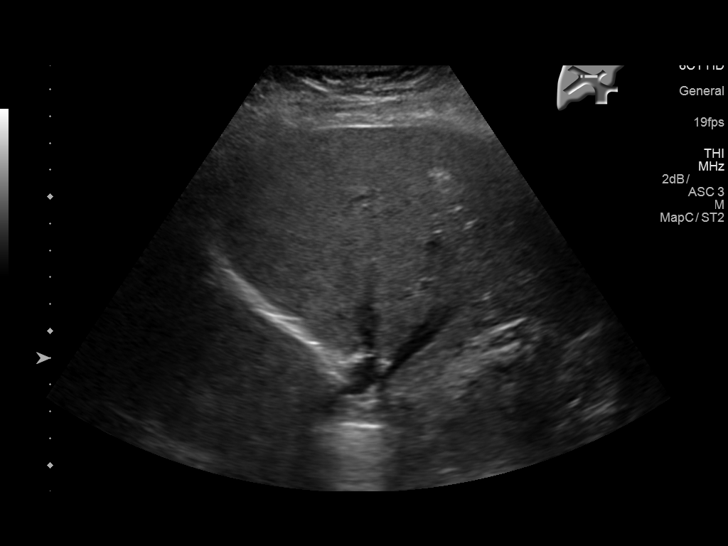
[im 48/114]
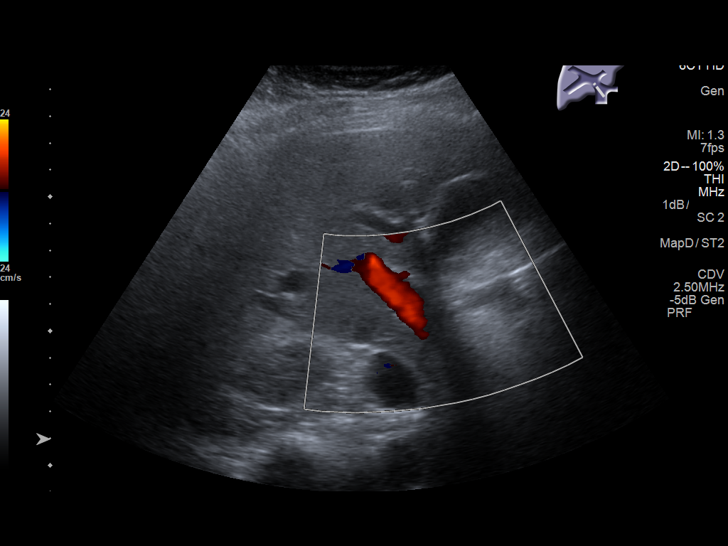
[im 57/114]
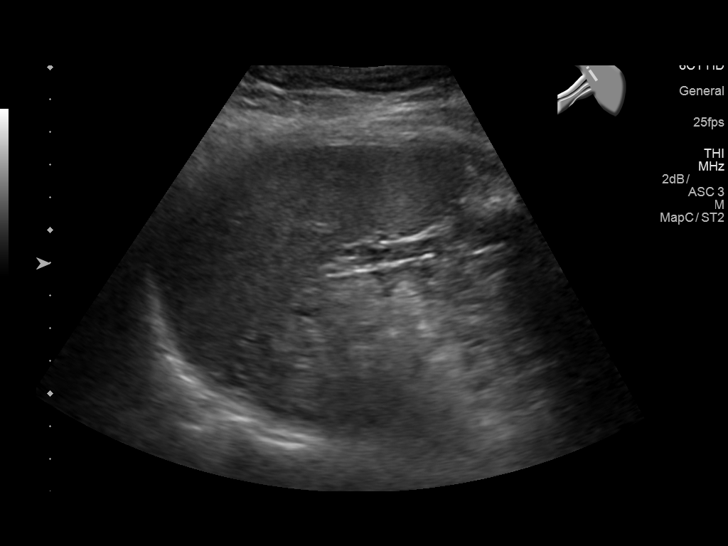
[im 66/114]
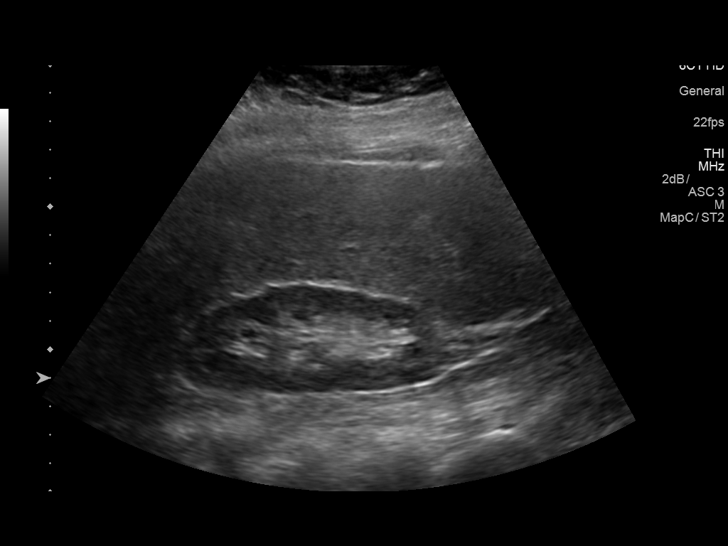
[im 76/114]
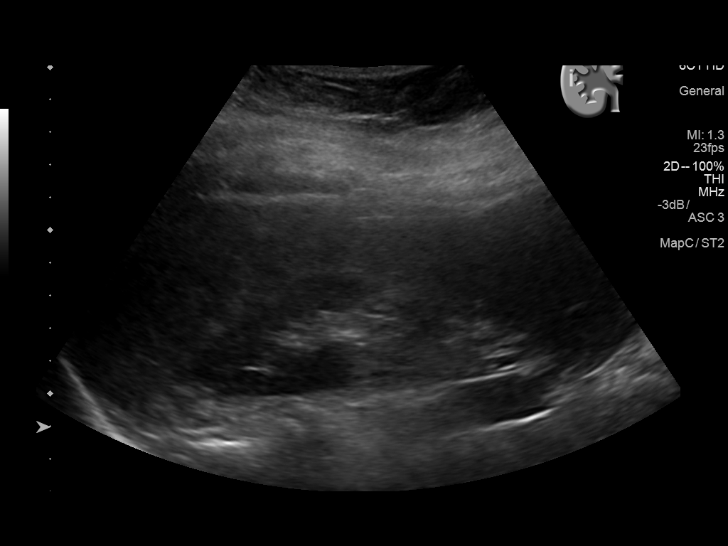
[im 85/114]
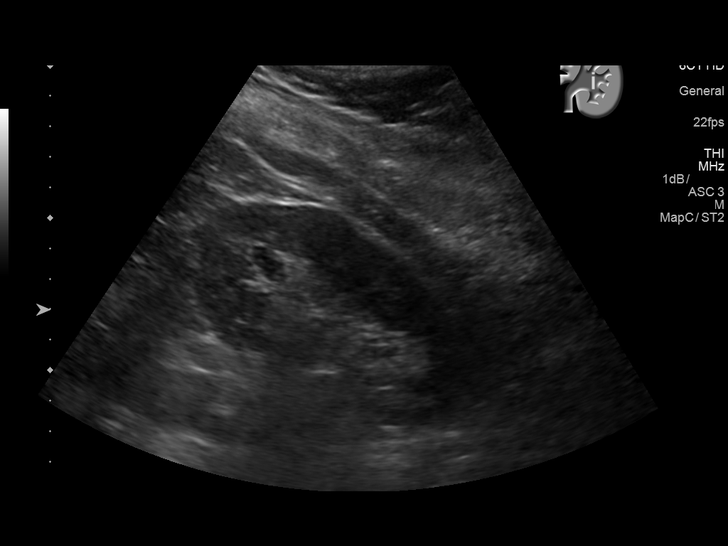
[im 95/114]
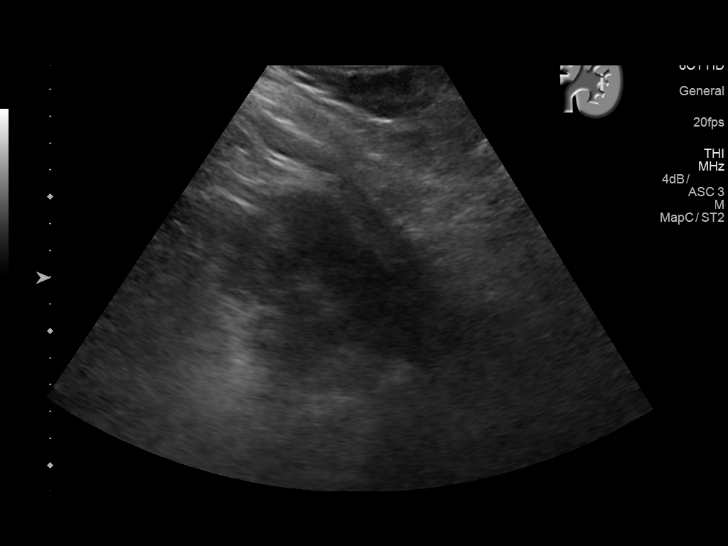
[im 104/114]
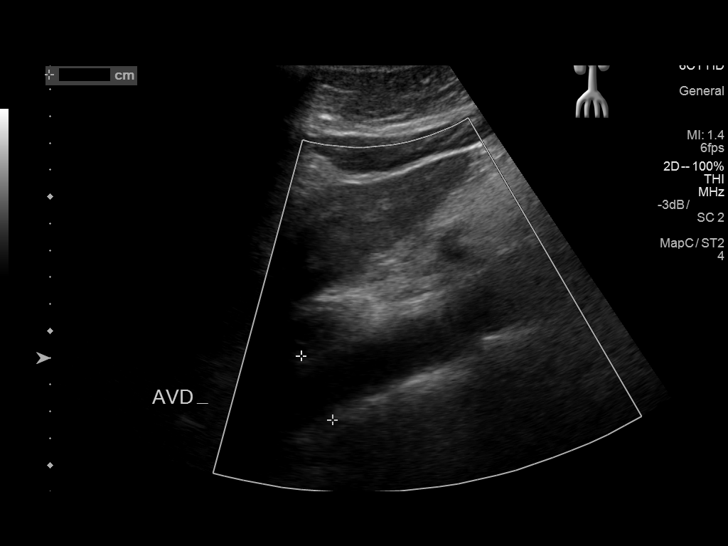
[im 114/114]
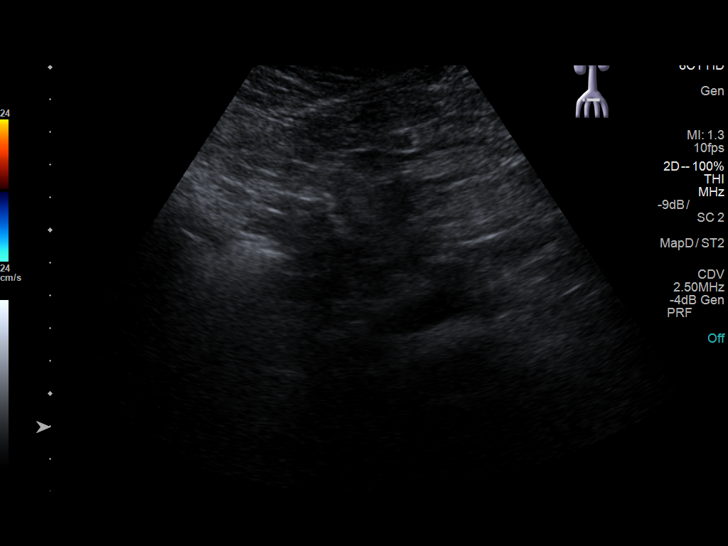

[13 of 25 positions shown; findings below may reference images not displayed]

FINDINGS: Gallbladder: No gallstones or wall thickening visualized. No
sonographic Murphy sign noted by sonographer.

Common bile duct: Diameter: 4 mm

Liver: There is echogenic consistent fatty infiltration and/or
hepatocellular disease. 2.3 cm echogenic focus in the right lobe
liver most likely hemangioma. Confirmation with MRI can be obtained.

IVC: No abnormality visualized.

Pancreas: Visualized portion unremarkable.

Spleen: Size and appearance within normal limits.

Right Kidney: Length: 11.5 cm. Echogenicity within normal limits. No
mass or hydronephrosis visualized.

Left Kidney: Length: 11.5 cm. Echogenicity within normal limits. No
mass or hydronephrosis visualized.

Abdominal aorta: No aneurysm visualized.

Other findings: None.
IMPRESSION: 1. No gallstones or biliary distention.

2. Echogenic liver consistent with fatty infiltration and/or
hepatocellular disease. A 2.3 cm hyperechoic focus is in the right
lobe of liver, most likely a hemangioma. Correlate with prior
reports. If need be MRI of the liver can be obtained for further
evaluation.

## 2016-08-29 ENCOUNTER — Other Ambulatory Visit: Payer: Self-pay | Admitting: Family Medicine

## 2016-11-01 DIAGNOSIS — Z01419 Encounter for gynecological examination (general) (routine) without abnormal findings: Secondary | ICD-10-CM | POA: Diagnosis not present

## 2016-11-01 DIAGNOSIS — Z6830 Body mass index (BMI) 30.0-30.9, adult: Secondary | ICD-10-CM | POA: Diagnosis not present

## 2016-11-29 ENCOUNTER — Other Ambulatory Visit: Payer: Self-pay | Admitting: Family Medicine

## 2016-12-13 DIAGNOSIS — L820 Inflamed seborrheic keratosis: Secondary | ICD-10-CM | POA: Diagnosis not present

## 2016-12-13 DIAGNOSIS — D1801 Hemangioma of skin and subcutaneous tissue: Secondary | ICD-10-CM | POA: Diagnosis not present

## 2016-12-13 DIAGNOSIS — L918 Other hypertrophic disorders of the skin: Secondary | ICD-10-CM | POA: Diagnosis not present

## 2016-12-13 DIAGNOSIS — D2239 Melanocytic nevi of other parts of face: Secondary | ICD-10-CM | POA: Diagnosis not present

## 2016-12-13 DIAGNOSIS — L821 Other seborrheic keratosis: Secondary | ICD-10-CM | POA: Diagnosis not present

## 2016-12-13 DIAGNOSIS — D225 Melanocytic nevi of trunk: Secondary | ICD-10-CM | POA: Diagnosis not present

## 2016-12-27 ENCOUNTER — Encounter: Payer: Self-pay | Admitting: Family Medicine

## 2016-12-27 ENCOUNTER — Ambulatory Visit (INDEPENDENT_AMBULATORY_CARE_PROVIDER_SITE_OTHER): Payer: 59 | Admitting: Family Medicine

## 2016-12-27 VITALS — BP 120/86 | HR 88 | Temp 98.6°F | Ht 63.0 in | Wt 174.0 lb

## 2016-12-27 DIAGNOSIS — J309 Allergic rhinitis, unspecified: Secondary | ICD-10-CM | POA: Diagnosis not present

## 2016-12-27 DIAGNOSIS — I10 Essential (primary) hypertension: Secondary | ICD-10-CM

## 2016-12-27 DIAGNOSIS — R0609 Other forms of dyspnea: Secondary | ICD-10-CM

## 2016-12-27 LAB — COMPREHENSIVE METABOLIC PANEL
ALT: 24 U/L (ref 0–35)
AST: 21 U/L (ref 0–37)
Albumin: 4 g/dL (ref 3.5–5.2)
Alkaline Phosphatase: 80 U/L (ref 39–117)
BILIRUBIN TOTAL: 0.3 mg/dL (ref 0.2–1.2)
BUN: 16 mg/dL (ref 6–23)
CO2: 24 mEq/L (ref 19–32)
Calcium: 9.8 mg/dL (ref 8.4–10.5)
Chloride: 109 mEq/L (ref 96–112)
Creatinine, Ser: 0.8 mg/dL (ref 0.40–1.20)
GFR: 80.42 mL/min (ref >60.00)
Glucose, Bld: 87 mg/dL (ref 70–99)
Potassium: 4 mEq/L (ref 3.5–5.1)
Sodium: 141 mEq/L (ref 135–145)
TOTAL PROTEIN: 6.4 g/dL (ref 6.0–8.3)

## 2016-12-27 LAB — CBC
HCT: 41.7 % (ref 36.0–46.0)
Hemoglobin: 14.2 g/dL (ref 12.0–15.0)
MCHC: 34 g/dL (ref 30.0–36.0)
MCV: 89.3 fl (ref 78.0–100.0)
Platelets: 211 10*3/uL (ref 150.0–400.0)
RBC: 4.67 Mil/uL (ref 3.87–5.11)
RDW: 12.7 % (ref 11.5–15.5)
WBC: 7.3 10*3/uL (ref 4.0–10.5)

## 2016-12-27 LAB — TSH: TSH: 3.54 u[IU]/mL (ref 0.35–4.50)

## 2016-12-27 MED ORDER — MONTELUKAST SODIUM 10 MG PO TABS: 10.0000 mg | ORAL_TABLET | Freq: Every day | ORAL | 3 refills | Status: DC

## 2016-12-27 NOTE — Addendum Note (Signed)
Addended by: Caryl Bis, Graylyn Bunney G on: 12/27/2016 11:08 AM   Modules accepted: Orders

## 2016-12-27 NOTE — Assessment & Plan Note (Addendum)
Relatively well controlled. She'll continue current medications. We will add Singulair.

## 2016-12-27 NOTE — Assessment & Plan Note (Signed)
Patient with several months of exertional dyspnea. Suspect this is related to deconditioning though we will obtain lab work as outlined below to evaluate for other causes. EKG is reassuring. We'll determine next step in management based off of lab work. Patient's given return precautions.

## 2016-12-27 NOTE — Progress Notes (Signed)
Pre visit review using our clinic review tool, if applicable. No additional management support is needed unless otherwise documented below in the visit note. 

## 2016-12-27 NOTE — Patient Instructions (Signed)
Nice to meet you. We will check some lab work today and contact you with the results. If you're allergy symptoms worsen we could consider Singulair. If you develop persistent shortness of breath, or develop chest pain, diaphoresis, or any new or changing symptoms please seek medical attention immediately.

## 2016-12-27 NOTE — Progress Notes (Addendum)
  Tommi Rumps, MD Phone: [phone]  Lindsey Carson is a 51 y.o. female who presents today for f/u.  HYPERTENSION  Disease Monitoring  Home BP Monitoring reports it has been fine at home Chest pain- no    Dyspnea- yes, see below Medications  Compliance-  Taking losartan.  Edema- no  Patient notes some exertional shortness of breath over the last several months. She has started exercising and has noticed this. Does get short of breath with walking and feels this is a little bit out of what she would expect. No chest pain or diaphoresis with this. No orthopnea or PND. She does have a history of SVT though has not felt any palpitations or sudden thumps of pain like she did previously. Notes her father had a history of an MI at 70. She notes her diabetes history. No hyperlipidemia.  Allergic rhinitis: Patient notes this is under better control with Zyrtec and Nasacort though still gets occasional rhinorrhea and watery eyes. Notes she was previously getting a fair number of sinus infections though has not had those in a while.   PMH: nonsmoker.   ROS see history of present illness  Objective  Physical Exam Vitals:   12/27/16 1044  BP: 120/86  Pulse: 88  Temp: 98.6 F (37 C)    BP Readings from Last 3 Encounters:  12/27/16 120/86  12/15/15 102/62  11/24/15 112/74   Wt Readings from Last 3 Encounters:  12/27/16 174 lb (78.9 kg)  12/15/15 169 lb 12.8 oz (77 kg)  11/24/15 168 lb 12.8 oz (76.6 kg)    Physical Exam  Constitutional: No distress.  Cardiovascular: Normal rate, regular rhythm and normal heart sounds.   Pulmonary/Chest: Effort normal and breath sounds normal.  Musculoskeletal: She exhibits no edema.  Neurological: She is alert. Gait normal.  Skin: Skin is warm and dry. She is not diaphoretic.   EKG: Normal sinus rhythm, rate 66, no apparent ischemic changes  Assessment/Plan: Please see individual problem list.  Hypertension At goal. Continue losartan.  Check CMP.  Allergic rhinitis Relatively well controlled. She'll continue current medications. We will add Singulair.  Exertional dyspnea Patient with several months of exertional dyspnea. Suspect this is related to deconditioning though we will obtain lab work as outlined below to evaluate for other causes. EKG is reassuring. We'll determine next step in management based off of lab work. Patient's given return precautions.   Orders Placed This Encounter  Procedures  . Comp Met (CMET)  . CBC  . TSH  . EKG 12-Lead   Tommi Rumps, MD [place]

## 2016-12-27 NOTE — Assessment & Plan Note (Signed)
At goal. Continue losartan. Check CMP. 

## 2016-12-28 ENCOUNTER — Other Ambulatory Visit: Payer: Self-pay | Admitting: Family Medicine

## 2016-12-28 DIAGNOSIS — R0609 Other forms of dyspnea: Principal | ICD-10-CM

## 2017-01-03 ENCOUNTER — Telehealth: Payer: 59 | Admitting: Family

## 2017-01-03 DIAGNOSIS — J329 Chronic sinusitis, unspecified: Secondary | ICD-10-CM

## 2017-01-03 DIAGNOSIS — B9689 Other specified bacterial agents as the cause of diseases classified elsewhere: Secondary | ICD-10-CM | POA: Diagnosis not present

## 2017-01-03 MED ORDER — DOXYCYCLINE HYCLATE 100 MG PO TABS: 100.0000 mg | ORAL_TABLET | Freq: Two times a day (BID) | ORAL | 0 refills | Status: DC

## 2017-01-03 NOTE — Progress Notes (Signed)

## 2017-01-10 ENCOUNTER — Ambulatory Visit (INDEPENDENT_AMBULATORY_CARE_PROVIDER_SITE_OTHER): Payer: 59 | Admitting: Cardiology

## 2017-01-10 ENCOUNTER — Encounter: Payer: Self-pay | Admitting: Cardiology

## 2017-01-10 VITALS — BP 120/82 | HR 100 | Ht 63.0 in | Wt 173.0 lb

## 2017-01-10 DIAGNOSIS — I10 Essential (primary) hypertension: Secondary | ICD-10-CM

## 2017-01-10 DIAGNOSIS — R0602 Shortness of breath: Secondary | ICD-10-CM | POA: Diagnosis not present

## 2017-01-10 MED ORDER — ASPIRIN EC 81 MG PO TBEC: 81.0000 mg | DELAYED_RELEASE_TABLET | Freq: Every day | ORAL | 3 refills | Status: AC

## 2017-01-10 NOTE — Patient Instructions (Addendum)
Medication Instructions:  Your physician has recommended you make the following change in your medication:  1. START aspirin 81 mg once a day   Testing/Procedures: Your physician has requested that you have an echocardiogram. Echocardiography is a painless test that uses sound waves to create images of your heart. It provides your doctor with information about the size and shape of your heart and how well your heart's chambers and valves are working. This procedure takes approximately one hour. There are no restrictions for this procedure.  Your physician has requested that you have a stress echocardiogram. For further information please visit [email or web address]. Please follow instruction sheet as given.   Do not drink or eat foods with caffeine for 24 hours before the test. (Chocolate, coffee, tea, or energy drinks)  If you use an inhaler, bring it with you to the test.  Do not smoke for 4 hours before the test.  Wear comfortable shoes and clothing.  Follow-Up: Your physician recommends that you schedule a follow-up appointment as needed. We will call you with results and if needed schedule follow up at that time.   It was a pleasure seeing you today here in the office. Please do not hesitate to give [place] a call back if you have any further questions. [place], BSN    Echocardiogram An echocardiogram, or echocardiography, uses sound waves (ultrasound) to produce an image of your heart. The echocardiogram is simple, painless, obtained within a short period of time, and offers valuable information to your health care provider. The images from an echocardiogram can provide information such as:  Evidence of coronary artery disease (CAD).  Heart size.  Heart muscle function.  Heart valve function.  Aneurysm detection.  Evidence of a past heart attack.  Fluid buildup around the heart.  Heart muscle thickening.  Assess heart valve function. Tell a health  care provider about:  Any allergies you have.  All medicines you are taking, including vitamins, herbs, eye drops, creams, and over-the-counter medicines.  Any problems you or family members have had with anesthetic medicines.  Any blood disorders you have.  Any surgeries you have had.  Any medical conditions you have.  Whether you are pregnant or may be pregnant. What happens before the procedure? No special preparation is needed. Eat and drink normally. What happens during the procedure?  In order to produce an image of your heart, gel will be applied to your chest and a wand-like tool (transducer) will be moved over your chest. The gel will help transmit the sound waves from the transducer. The sound waves will harmlessly bounce off your heart to allow the heart images to be captured in real-time motion. These images will then be recorded.  You may need an IV to receive a medicine that improves the quality of the pictures. What happens after the procedure? You may return to your normal schedule including diet, activities, and medicines, unless your health care provider tells you otherwise. This information is not intended to replace advice given to you by your health care provider. Make sure you discuss any questions you have with your health care provider. Document Released: 10/19/2000 Document Revised: 06/09/2016 Document Reviewed: 06/29/2013 Elsevier Interactive Patient Education  2017 [employer].  Exercise Stress Echocardiogram An exercise stress echocardiogram is a test that checks how well your heart is working. For this test, you will walk on a treadmill to make your heart beat faster. This test uses sound waves (ultrasound) and a computer to  make pictures (images) of your heart. These pictures will be taken before you exercise and after you exercise. What happens before the procedure?  Follow instructions from your doctor about what you cannot eat or drink before the  test.  Do not drink or eat anything that has caffeine in it. Stop having caffeine for 24 hours before the test.  Ask your doctor about changing or stopping your normal medicines. This is important if you take diabetes medicines or blood thinners. Ask your doctor if you should take your medicines with water before the test.  If you use an inhaler, bring it to the test.  Do not use any products that have nicotine or tobacco in them, such as cigarettes and e-cigarettes. Stop using them for 4 hours before the test. If you need help quitting, ask your doctor.  Wear comfortable shoes and clothing. What happens during the procedure?  You will be hooked up to a TV screen. Your doctor will watch the screen to see how fast your heart beats during the test.  Before you exercise, a computer will make a picture of your heart. To do this:  A gel will be put on your chest.  A wand will be moved over the gel.  Sound waves from the wand will go to the computer to make the picture.  Your will start walking on a treadmill. The treadmill will start at a slow speed. It will get faster a little bit at a time. When you walk faster, your heart will beat faster.  The treadmill will be stopped when your heart is working hard.  You will lie down right away so another picture of your heart can be taken.  The test will take 30-60 minutes. What happens after the procedure?  Your heart rate and blood pressure will be watched after the test.  If your doctor says that you can, you may:  Eat what you usually eat.  Do your normal activities.  Take medicines like normal. Summary  An exercise stress echocardiogram is a test that checks how well your heart is working.  Follow instructions about what you cannot eat or drink before the test. Ask your doctor if you should take your normal medicines before the test.  Stop having caffeine for 24 hours before the test. Do not use anything with nicotine or tobacco  in it for 4 hours before the test.  A computer will take a picture of your heart before you walk on a treadmill. It will take another picture when you are done walking.  Your heart rate and blood pressure will be watched after the test. This information is not intended to replace advice given to you by your health care provider. Make sure you discuss any questions you have with your health care provider. Document Released: 08/19/2009 Document Revised: 07/15/2016 Document Reviewed: 07/15/2016 Elsevier Interactive Patient Education  2017 [employer].

## 2017-01-10 NOTE — Progress Notes (Signed)
Cardiology Office Note   Date:  01/10/2017   ID:  Lindsey Carson, DOB 1966-05-25, MRN [id number]  Referring Doctor:  Tommi Rumps, MD   Cardiologist:   Wende Bushy, MD   Reason for consultation:  Chief Complaint  Patient presents with  . other    NP.  Referred by Dr. Josephina Gip for SOB . Reviewed meds with pt verbally.      History of Present Illness: Lindsey Carson is a 51 y.o. female who presents for SOB. Going on for 6 mo. exertional in nature. Mild to moderate activity brings on dyspnea, 8 out of 10 in severity, goes away after a few minutes of rest. Symptoms mainly in the chest, nonradiating, no chest pain or palpitations no passing out. She is concerned since she's never really had shortness of breath like this before. No significant changes in her weight to explain this.  No PND, orthopnea, edema.  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 Aside from mentioned under HPI, all other systems are reviewed and negative.     Past Medical History:  Diagnosis Date  . Chronic allergic rhinitis    Severe, year long  . Chronic back pain    HNP  . Depression   . Elevated blood pressure    History of  . Heart murmur    was told but doesn't require meds  . Hemorrhoids   . Hypertension   . Joint pain    hands and knees  . OCP (oral contraceptive pills) initiation    Use  . Seasonal allergies    uses nasocort daily and zyrtec  . Sinus infection    History of frequent  . Spinal fracture    L5/S1    Past Surgical History:  Procedure Laterality Date  . [place]   x 2   . LUMBAR LAMINECTOMY/DECOMPRESSION MICRODISCECTOMY  09/08/2012   Procedure: LUMBAR LAMINECTOMY/DECOMPRESSION MICRODISCECTOMY 1 LEVEL;  Surgeon: Charlie Pitter, MD;  Location: [facility];  Service: Neurosurgery;  Laterality: Left;  left Lumbar Five-[place] one laminectomy/microdiscectomy     reports  that she has never smoked. She has never used smokeless tobacco. She reports that she does not drink alcohol or use drugs.   family history includes Coronary artery disease in her father; Diabetes in her brother, father, and mother; Heart attack in her father; Hemachromatosis in her brother and father; Hypertension in her mother; Hypothyroidism in her mother.   Outpatient Medications Prior to Visit  Medication Sig Dispense Refill  . cetirizine (ZYRTEC ALLERGY) 10 MG tablet Take 10 mg by mouth daily.      Marland Kitchen doxycycline (VIBRA-TABS) 100 MG tablet Take 1 tablet (100 mg total) by mouth 2 (two) times daily. 20 tablet 0  . losartan (COZAAR) 50 MG tablet TAKE 1 TABLET BY MOUTH DAILY 30 tablet 2  . montelukast (SINGULAIR) 10 MG tablet Take 1 tablet (10 mg total) by mouth at bedtime. 30 tablet 3  . triamcinolone (NASACORT AQ) 55 MCG/ACT nasal inhaler Place 2 sprays into the nose daily.      . Norethin Ace-Eth Estrad-FE (LOESTRIN 24 FE) 1-20 MG-MCG(24) TABS Take 1 tablet by mouth daily. Reported on 11/24/2015     No facility-administered medications prior to visit.      Allergies: Augmentin [amoxicillin-pot clavulanate] and Latex    PHYSICAL EXAM: VS:  BP 120/82 (BP Location: Right Arm, Patient  Position: Sitting, Cuff Size: Normal)   Pulse 100   Ht 5\' 3"  (1.6 m)   Wt 173 lb (78.5 kg)   BMI 30.65 kg/m  , Body mass index is 30.65 kg/m. Wt Readings from Last 3 Encounters:  01/10/17 173 lb (78.5 kg)  12/27/16 174 lb (78.9 kg)  12/15/15 169 lb 12.8 oz (77 kg)    GENERAL:  well developed, well nourished, obese, not in acute distress HEENT: normocephalic, pink conjunctivae, anicteric sclerae, no xanthelasma, normal dentition, oropharynx clear NECK:  no neck vein engorgement, JVP normal, no hepatojugular reflux, carotid upstroke brisk and symmetric, no bruit, no thyromegaly, no lymphadenopathy LUNGS:  good respiratory effort, clear to auscultation bilaterally CV:  PMI not displaced, no thrills, no  lifts, S1 and S2 within normal limits, no palpable S3 or S4, no murmurs, no rubs, no gallops ABD:  Soft, nontender, nondistended, normoactive bowel sounds, no abdominal aortic bruit, no hepatomegaly, no splenomegaly MS: nontender back, no kyphosis, no scoliosis, no joint deformities EXT:  2+ DP/PT pulses, no edema, no varicosities, no cyanosis, no clubbing SKIN: warm, nondiaphoretic, normal turgor, no ulcers NEUROPSYCH: alert, oriented to person, place, and time, sensory/motor grossly intact, normal mood, appropriate affect  Recent Labs: 12/27/2016: ALT 24; BUN 16; Creatinine, Ser 0.80; Hemoglobin 14.2; Platelets 211.0; Potassium 4.0; Sodium 141; TSH 3.54   Lipid Panel    Component Value Date/Time   CHOL 190 12/29/2015 0957   TRIG 241.0 (H) 12/29/2015 0957   HDL 40.40 12/29/2015 0957   CHOLHDL 5 12/29/2015 0957   VLDL 48.2 (H) 12/29/2015 0957   LDLDIRECT 99.0 12/29/2015 0957     Other studies Reviewed:  EKG:  The ekg from 01/10/2017 was personally reviewed by me and it revealed sinus rhythm, 100 BPM, nonspecific ST T wave changes, likely rate related.   Additional studies/ records that were reviewed personally reviewed by me today include: None available   ASSESSMENT AND PLAN:  Shortness of breath, dyspnea on exertion Risk factors for CAD include hypertension, obesity, family history heart disease next Recommend further evaluation with echo and stress echocardiogram. Recommend aspirin 81 mg by mouth daily for now.  HTN BP is well controlled. Continue monitoring BP. Continue current medical therapy and lifestyle changes.    Current medicines are reviewed at length with the patient today.  The patient does not have concerns regarding medicines.  Labs/ tests ordered today include:  Orders Placed This Encounter  Procedures  . EKG 12-Lead  . ECHOCARDIOGRAM COMPLETE  . ECHOCARDIOGRAM STRESS TEST    I had a lengthy and detailed discussion with the patient regarding  diagnoses, prognosis, diagnostic options.  I counseled the patient on importance of lifestyle modification including heart healthy diet, regular physical activity Once cardiac workup is completed.   Disposition:   FU with undersigned after tests  Thank you for this consultation. We will forwarding this consultation to referring physician.   Signed, Wende Bushy, MD  01/10/2017 4:44 PM    [place]  This note was generated in part with voice recognition software and I apologize for any typographical errors that were not detected and corrected.

## 2017-01-24 DIAGNOSIS — Z780 Asymptomatic menopausal state: Secondary | ICD-10-CM | POA: Diagnosis not present

## 2017-01-24 DIAGNOSIS — Z1231 Encounter for screening mammogram for malignant neoplasm of breast: Secondary | ICD-10-CM | POA: Diagnosis not present

## 2017-01-28 ENCOUNTER — Other Ambulatory Visit: Payer: Self-pay | Admitting: Obstetrics and Gynecology

## 2017-01-28 DIAGNOSIS — R928 Other abnormal and inconclusive findings on diagnostic imaging of breast: Secondary | ICD-10-CM

## 2017-02-01 ENCOUNTER — Ambulatory Visit
Admission: RE | Admit: 2017-02-01 | Discharge: 2017-02-01 | Disposition: A | Discharge status: Home / Self Care | Payer: 59 | Source: Ambulatory Visit | Attending: Obstetrics and Gynecology | Admitting: Obstetrics and Gynecology

## 2017-02-01 DIAGNOSIS — R922 Inconclusive mammogram: Secondary | ICD-10-CM | POA: Diagnosis not present

## 2017-02-01 DIAGNOSIS — R928 Other abnormal and inconclusive findings on diagnostic imaging of breast: Secondary | ICD-10-CM

## 2017-02-22 ENCOUNTER — Ambulatory Visit (INDEPENDENT_AMBULATORY_CARE_PROVIDER_SITE_OTHER): Payer: 59

## 2017-02-22 ENCOUNTER — Other Ambulatory Visit: Payer: Self-pay

## 2017-02-22 DIAGNOSIS — R0602 Shortness of breath: Secondary | ICD-10-CM

## 2017-02-23 LAB — ECHOCARDIOGRAM STRESS TEST
CHL CUP MPHR: 169 {beats}/min
Estimated workload: 10.1 METS
Exercise duration (min): 8 min
Exercise duration (sec): 38 s
Peak HR: 230 {beats}/min
Percent HR: 136 %
Rest HR: 82 {beats}/min

## 2017-02-25 ENCOUNTER — Other Ambulatory Visit: Payer: Self-pay | Admitting: Family Medicine

## 2017-02-28 ENCOUNTER — Encounter: Payer: Self-pay | Admitting: Cardiology

## 2017-03-01 ENCOUNTER — Encounter: Payer: Self-pay | Admitting: Cardiology

## 2017-05-27 ENCOUNTER — Other Ambulatory Visit: Payer: Self-pay | Admitting: Family Medicine

## 2017-06-27 ENCOUNTER — Ambulatory Visit (INDEPENDENT_AMBULATORY_CARE_PROVIDER_SITE_OTHER): Payer: 59 | Admitting: Family Medicine

## 2017-06-27 ENCOUNTER — Encounter: Payer: Self-pay | Admitting: Family Medicine

## 2017-06-27 VITALS — BP 100/72 | HR 70 | Temp 98.1°F | Ht 63.0 in | Wt 145.8 lb

## 2017-06-27 DIAGNOSIS — R0609 Other forms of dyspnea: Secondary | ICD-10-CM | POA: Diagnosis not present

## 2017-06-27 DIAGNOSIS — L821 Other seborrheic keratosis: Secondary | ICD-10-CM

## 2017-06-27 DIAGNOSIS — I10 Essential (primary) hypertension: Secondary | ICD-10-CM | POA: Diagnosis not present

## 2017-06-27 DIAGNOSIS — J309 Allergic rhinitis, unspecified: Secondary | ICD-10-CM | POA: Diagnosis not present

## 2017-06-27 DIAGNOSIS — E663 Overweight: Secondary | ICD-10-CM | POA: Diagnosis not present

## 2017-06-27 DIAGNOSIS — Z1211 Encounter for screening for malignant neoplasm of colon: Secondary | ICD-10-CM | POA: Diagnosis not present

## 2017-06-27 LAB — LIPID PANEL
CHOL/HDL RATIO: 5
Cholesterol: 180 mg/dL (ref 0–200)
HDL: 38.7 mg/dL — AB (ref >39.00)
LDL Cholesterol: 119 mg/dL — ABNORMAL HIGH (ref 0–99)
NONHDL: 141.64
TRIGLYCERIDES: 112 mg/dL (ref 0.0–149.0)
VLDL: 22.4 mg/dL (ref 0.0–40.0)

## 2017-06-27 LAB — COMPREHENSIVE METABOLIC PANEL
ALK PHOS: 128 U/L — AB (ref 39–117)
ALT: 21 U/L (ref 0–35)
AST: 19 U/L (ref 0–37)
Albumin: 4.4 g/dL (ref 3.5–5.2)
BILIRUBIN TOTAL: 0.4 mg/dL (ref 0.2–1.2)
BUN: 15 mg/dL (ref 6–23)
CALCIUM: 11.5 mg/dL — AB (ref 8.4–10.5)
CO2: 28 meq/L (ref 19–32)
CREATININE: 0.71 mg/dL (ref 0.40–1.20)
Chloride: 108 mEq/L (ref 96–112)
GFR: 92.11 mL/min (ref >60.00)
GLUCOSE: 91 mg/dL (ref 70–99)
Potassium: 4.3 mEq/L (ref 3.5–5.1)
Sodium: 141 mEq/L (ref 135–145)
TOTAL PROTEIN: 6.7 g/dL (ref 6.0–8.3)

## 2017-06-27 LAB — HEMOGLOBIN A1C: Hgb A1c MFr Bld: 5.4 % (ref 4.6–6.5)

## 2017-06-27 NOTE — Assessment & Plan Note (Signed)
Underwent fairly unremarkable cardiac evaluation. Found to have grade 1 diastolic dysfunction. Discussed blood pressure control and weight loss for that. She notes this has significantly improved since she has lost weight. She down about 30 pounds. Suspect this was related to obesity and deconditioning. Discussed if it does not continue to improve with weight loss and exercise or if it worsens again we could consider pulmonary evaluation.

## 2017-06-27 NOTE — Assessment & Plan Note (Signed)
Benign-appearing seborrheic keratosis. Continue to follow with dermatology.

## 2017-06-27 NOTE — Progress Notes (Signed)
  Lindsey Rumps, MD Phone: [phone]  Lindsey Carson is a 51 y.o. female who presents today for f/u.  HYPERTENSION  Disease Monitoring  Home BP Monitoring well controlled Chest pain- no    Dyspnea- no Medications  Compliance-  Taking losartan.  Edema- no  Overweight: Patient has started doing Weight Watchers and has lost some weight. She is eating mostly when he meets with chicken and fish and also lots of vegetables. She not really exercising at this time.  Allergic rhinitis: Patient notes she has had nonstop rhinorrhea over the summer. Gets worse anytime she goes outside. No postnasal drip. No excessive congestion. She does take Zyrtec and Nasacort. The Nasacort helps with the congestion though she thinks it might make the rhinorrhea worse.  Seborrheic keratosis: Patient has this in her upper mid back. Is followed by her dermatologist. They're monitoring it at this time. She would like it taken off though her dermatologist is not planning to do this yet.  PMH: nonsmoker.   ROS see history of present illness  Objective  Physical Exam Vitals:   06/27/17 0804  BP: 100/72  Pulse: 70  Temp: 98.1 F (36.7 C)  SpO2: 98%    BP Readings from Last 3 Encounters:  06/27/17 100/72  01/10/17 120/82  12/27/16 120/86   Wt Readings from Last 3 Encounters:  06/27/17 145 lb 12.8 oz (66.1 kg)  01/10/17 173 lb (78.5 kg)  12/27/16 174 lb (78.9 kg)    Physical Exam  Constitutional: No distress.  HENT:  Head: Normocephalic and atraumatic.  Cardiovascular: Normal rate, regular rhythm and normal heart sounds.   Pulmonary/Chest: Effort normal and breath sounds normal.  Musculoskeletal: She exhibits no edema.  Neurological: She is alert. Gait normal.  Skin: She is not diaphoretic.        Assessment/Plan: Please see individual problem list.  Hypertension At goal. Continue losartan. Check CMP.  Allergic rhinitis Patient will trial Flonase and Claritin or Allegra instead of her  current regimen of Nasacort and Zyrtec. If not improving and she wants to see an allergist she'll let us know.  Overweight (BMI 25.0-29.9) Patient has done a great job losing weight with dietary changes. Encouraged exercise.  Seborrheic keratosis Benign-appearing seborrheic keratosis. Continue to follow with dermatology.  Exertional dyspnea Underwent fairly unremarkable cardiac evaluation. Found to have grade 1 diastolic dysfunction. Discussed blood pressure control and weight loss for that. She notes this has significantly improved since she has lost weight. She down about 30 pounds. Suspect this was related to obesity and deconditioning. Discussed if it does not continue to improve with weight loss and exercise or if it worsens again we could consider pulmonary evaluation.   Orders Placed This Encounter  Procedures  . Lipid panel  . Comp Met (CMET)  . HgB A1c  . Ambulatory referral to Gastroenterology    Referral Priority:   Routine    Referral Type:   Consultation    Referral Reason:   Specialty Services Required    Number of Visits Requested:   [place], MD [place]

## 2017-06-27 NOTE — Patient Instructions (Signed)
Nice to see you. We'll check some lab work today and contact you with the results. Please continue with your diet changes. Please add in some exercise. Please try switching your nasal steroid to Flonase. Please also try Claritin or Allegra for your allergies. If these are not beneficial please let us know.

## 2017-06-27 NOTE — Assessment & Plan Note (Signed)
At goal. Continue losartan. Check CMP.

## 2017-06-27 NOTE — Assessment & Plan Note (Signed)
Patient will trial Flonase and Claritin or Allegra instead of her current regimen of Nasacort and Zyrtec. If not improving and she wants to see an allergist she'll let us know.

## 2017-06-27 NOTE — Assessment & Plan Note (Signed)
Patient has done a great job losing weight with dietary changes. Encouraged exercise.

## 2017-06-29 ENCOUNTER — Other Ambulatory Visit: Payer: Self-pay | Admitting: Family Medicine

## 2017-06-29 DIAGNOSIS — R748 Abnormal levels of other serum enzymes: Secondary | ICD-10-CM

## 2017-07-04 ENCOUNTER — Encounter: Payer: Self-pay | Admitting: Internal Medicine

## 2017-07-25 ENCOUNTER — Other Ambulatory Visit (INDEPENDENT_AMBULATORY_CARE_PROVIDER_SITE_OTHER): Payer: 59

## 2017-07-25 ENCOUNTER — Other Ambulatory Visit: Payer: Self-pay | Admitting: Family Medicine

## 2017-07-25 DIAGNOSIS — R748 Abnormal levels of other serum enzymes: Secondary | ICD-10-CM | POA: Diagnosis not present

## 2017-07-25 LAB — HEPATIC FUNCTION PANEL
ALBUMIN: 4.2 g/dL (ref 3.5–5.2)
ALT: 16 U/L (ref 0–35)
AST: 17 U/L (ref 0–37)
Alkaline Phosphatase: 129 U/L — ABNORMAL HIGH (ref 39–117)
BILIRUBIN TOTAL: 0.6 mg/dL (ref 0.2–1.2)
Bilirubin, Direct: 0.1 mg/dL (ref 0.0–0.3)
Total Protein: 6.1 g/dL (ref 6.0–8.3)

## 2017-07-25 LAB — GAMMA GT: GGT: 13 U/L (ref 7–51)

## 2017-08-01 DIAGNOSIS — N950 Postmenopausal bleeding: Secondary | ICD-10-CM | POA: Diagnosis not present

## 2017-08-05 HISTORY — PX: OTHER SURGICAL HISTORY: SHX169

## 2017-08-08 ENCOUNTER — Ambulatory Visit (AMBULATORY_SURGERY_CENTER): Payer: Self-pay | Admitting: *Deleted

## 2017-08-08 VITALS — Ht 63.0 in | Wt 139.0 lb

## 2017-08-08 DIAGNOSIS — Z1211 Encounter for screening for malignant neoplasm of colon: Secondary | ICD-10-CM

## 2017-08-08 MED ORDER — SUPREP BOWEL PREP KIT 17.5-3.13-1.6 GM/177ML PO SOLN: 1.0000 | Freq: Once | ORAL | 0 refills | Status: AC

## 2017-08-08 NOTE — Progress Notes (Signed)
Patient denies any allergies to egg or soy products. Patient has PONV with anesthesia/sedation.  Patient denies oxygen use at home and denies diet medications. Pamphlet for colonoscopy given to patient.

## 2017-08-16 ENCOUNTER — Encounter: Payer: Self-pay | Admitting: Internal Medicine

## 2017-08-19 ENCOUNTER — Telehealth: Payer: 59 | Admitting: Family

## 2017-08-19 DIAGNOSIS — H5710 Ocular pain, unspecified eye: Secondary | ICD-10-CM | POA: Diagnosis not present

## 2017-08-19 DIAGNOSIS — H05012 Cellulitis of left orbit: Secondary | ICD-10-CM | POA: Diagnosis not present

## 2017-08-19 DIAGNOSIS — H00016 Hordeolum externum left eye, unspecified eyelid: Secondary | ICD-10-CM | POA: Diagnosis not present

## 2017-08-19 NOTE — Progress Notes (Signed)
Thank you for the details you included in the comment boxes. Those details are very helpful in determining the best course of treatment for you and help [place] to provide the best care. Given that it has been 1 week and it is severe, this may or may not result in permanent damage to your eye or loss of vision if not treated properly. This must be examined in person with special equipment.  Based on what you shared with me it looks like you have a serious condition that should be evaluated in a face to face office visit.  NOTE: Even if you have entered your credit card information for this eVisit, you will not be charged.   If you are having a true medical emergency please call 911.  If you need an urgent face to face visit, [place] has four urgent care centers for your convenience.  If you need care fast and have a high deductible or no insurance consider:   [email or web address]  [phone]  [address], [address] [place], [place] [address] 8 am to 8 pm Monday-Friday 10 am to 4 pm Saturday-Sunday   The following sites will take your  insurance:    . [facility] [place] a Provider at this Location  [address] [place], [place] [address] . 10 am to 8 pm Monday-Friday . 12 pm to 8 pm Saturday-Sunday   . [facility] at [place] a Provider at this Location  [address], [place], [place] [address] . 8 am to 8 pm Monday-Friday . 9 am to 6 pm Saturday . 11 am to 6 pm Sunday   . [facility] [place] Get Driving Directions  [address] [place], [place] [address] . 8 am to 8 pm Monday-Friday . 8 am to 4 pm Saturday-Sunday   Your e-visit answers were reviewed by a board certified advanced clinical practitioner to complete your personal care plan.  Thank you for using  e-Visits.

## 2017-08-21 ENCOUNTER — Other Ambulatory Visit: Payer: Self-pay | Admitting: Internal Medicine

## 2017-08-22 DIAGNOSIS — N950 Postmenopausal bleeding: Secondary | ICD-10-CM | POA: Diagnosis not present

## 2017-08-22 DIAGNOSIS — D259 Leiomyoma of uterus, unspecified: Secondary | ICD-10-CM | POA: Diagnosis not present

## 2017-08-22 HISTORY — PX: DILATION AND CURETTAGE OF UTERUS: SHX78

## 2017-08-29 ENCOUNTER — Ambulatory Visit (AMBULATORY_SURGERY_CENTER): Payer: 59 | Admitting: Internal Medicine

## 2017-08-29 ENCOUNTER — Encounter: Payer: Self-pay | Admitting: Internal Medicine

## 2017-08-29 VITALS — BP 109/64 | HR 72 | Temp 98.5°F | Resp 12 | Ht 63.0 in | Wt 145.0 lb

## 2017-08-29 DIAGNOSIS — Z800 Family history of malignant neoplasm of digestive organs: Secondary | ICD-10-CM | POA: Diagnosis present

## 2017-08-29 DIAGNOSIS — Z1211 Encounter for screening for malignant neoplasm of colon: Secondary | ICD-10-CM

## 2017-08-29 DIAGNOSIS — D122 Benign neoplasm of ascending colon: Secondary | ICD-10-CM

## 2017-08-29 DIAGNOSIS — Z1212 Encounter for screening for malignant neoplasm of rectum: Secondary | ICD-10-CM

## 2017-08-29 MED ORDER — SODIUM CHLORIDE 0.9 % IV SOLN: 500.0000 mL | INTRAVENOUS | Status: DC

## 2017-08-29 NOTE — Patient Instructions (Signed)
YOU HAD AN ENDOSCOPIC PROCEDURE TODAY AT [facility]:   Refer to the procedure report that was given to you for any specific questions about what was found during the examination.  If the procedure report does not answer your questions, please call your gastroenterologist to clarify.  If you requested that your care partner not be given the details of your procedure findings, then the procedure report has been included in a sealed envelope for you to review at your convenience later.  YOU SHOULD EXPECT: Some feelings of bloating in the abdomen. Passage of more gas than usual.  Walking can help get rid of the air that was put into your GI tract during the procedure and reduce the bloating. If you had a lower endoscopy (such as a colonoscopy or flexible sigmoidoscopy) you may notice spotting of blood in your stool or on the toilet paper. If you underwent a bowel prep for your procedure, you may not have a normal bowel movement for a few days.  Please Note:  You might notice some irritation and congestion in your nose or some drainage.  This is from the oxygen used during your procedure.  There is no need for concern and it should clear up in a day or so.  SYMPTOMS TO REPORT IMMEDIATELY:   Following lower endoscopy (colonoscopy or flexible sigmoidoscopy):  Excessive amounts of blood in the stool  Significant tenderness or worsening of abdominal pains  Swelling of the abdomen that is new, acute  Fever of 100F or higher   For urgent or emergent issues, a gastroenterologist can be reached at any hour by calling [phone].   DIET:  We do recommend a small meal at first, but then you may proceed to your regular diet.  Drink plenty of fluids but you should avoid alcoholic beverages for 24 hours.  ACTIVITY:  You should plan to take it easy for the rest of today and you should NOT DRIVE or use heavy machinery until tomorrow (because of the sedation medicines used during the test).     FOLLOW UP: Our staff will call the number listed on your records the next business day following your procedure to check on you and address any questions or concerns that you may have regarding the information given to you following your procedure. If we do not reach you, we will leave a message.  However, if you are feeling well and you are not experiencing any problems, there is no need to return our call.  We will assume that you have returned to your regular daily activities without incident.  If any biopsies were taken you will be contacted by phone or by letter within the next 1-3 weeks.  Please call us at [phone] if you have not heard about the biopsies in 3 weeks.    SIGNATURES/CONFIDENTIALITY: You and/or your care partner have signed paperwork which will be entered into your electronic medical record.  These signatures attest to the fact that that the information above on your After Visit Summary has been reviewed and is understood.  Full responsibility of the confidentiality of this discharge information lies with you and/or your care-partner.  You will need another colonoscopy in 5 years per Dr. Hilarie Fredrickson.

## 2017-08-29 NOTE — Op Note (Signed)
[facility] Patient Name: Lindsey Carson Procedure Date: 08/29/2017 11:50 AM MRN: [id number] Endoscopist: Jerene Bears , MD Age: 51 Referring MD:  Date of Birth: [birth year] Gender: Female Account #: [id number] Procedure:                Colonoscopy Indications:              Colon cancer screening in patient at increased                            risk: Family history of colorectal cancer in                            multiple 2nd degree relatives, This is the                            patient's first colonoscopy Medicines:                Monitored Anesthesia Care Procedure:                Pre-Anesthesia Assessment:                           - Prior to the procedure, a History and Physical                            was performed, and patient medications and                            allergies were reviewed. The patient's tolerance of                            previous anesthesia was also reviewed. The risks                            and benefits of the procedure and the sedation                            options and risks were discussed with the patient.                            All questions were answered, and informed consent                            was obtained. Prior Anticoagulants: The patient has                            taken no previous anticoagulant or antiplatelet                            agents. ASA Grade Assessment: II - A patient with                            mild systemic disease. After reviewing the risks  and benefits, the patient was deemed in                            satisfactory condition to undergo the procedure.                           After obtaining informed consent, the colonoscope                            was passed under direct vision. Throughout the                            procedure, the patient's blood pressure, pulse, and                            oxygen saturations were monitored continuously. The                             Colonoscope was introduced through the anus and                            advanced to the the terminal ileum. The colonoscopy                            was performed without difficulty. The patient                            tolerated the procedure well. The quality of the                            bowel preparation was good. The terminal ileum,                            ileocecal valve, appendiceal orifice, and rectum                            were photographed. Scope In: 11:56:13 AM Scope Out: 12:11:34 PM Scope Withdrawal Time: 0 hours 12 minutes 53 seconds  Total Procedure Duration: 0 hours 15 minutes 21 seconds  Findings:                 The digital rectal exam was normal.                           The terminal ileum appeared normal.                           A 4 mm polyp was found in the ascending colon. The                            polyp was sessile. The polyp was removed with a                            cold snare. Resection and retrieval were complete.  Internal hemorrhoids were found during                            retroflexion. The hemorrhoids were small.                           The exam was otherwise without abnormality. Complications:            No immediate complications. Estimated Blood Loss:     Estimated blood loss was minimal. Impression:               - The examined portion of the ileum was normal.                           - One 4 mm polyp in the ascending colon, removed                            with a cold snare. Resected and retrieved.                           - Internal hemorrhoids.                           - The examination was otherwise normal. Recommendation:           - Patient has a contact number available for                            emergencies. The signs and symptoms of potential                            delayed complications were discussed with the                            patient.  Return to normal activities tomorrow.                            Written discharge instructions were provided to the                            patient.                           - Resume previous diet.                           - Continue present medications.                           - Await pathology results.                           - Repeat colonoscopy in 5 years. Jerene Bears, MD 08/29/2017 12:15:43 PM This report has been signed electronically.

## 2017-08-29 NOTE — Progress Notes (Signed)
Called to room to assist during endoscopic procedure.  Patient ID and intended procedure confirmed with present staff. Received instructions for my participation in the procedure from the performing physician.  

## 2017-08-29 NOTE — Progress Notes (Signed)
To PACU, VSS. Report to [email or web address] 

## 2017-08-30 ENCOUNTER — Telehealth: Payer: Self-pay

## 2017-08-30 NOTE — Telephone Encounter (Signed)
Left message on answering machine. 

## 2017-08-30 NOTE — Telephone Encounter (Signed)
Called [phone] and left a messaged we tried to reach pt for a follow up call. maw

## 2017-09-03 ENCOUNTER — Encounter: Payer: Self-pay | Admitting: Internal Medicine

## 2017-09-05 DIAGNOSIS — Z09 Encounter for follow-up examination after completed treatment for conditions other than malignant neoplasm: Secondary | ICD-10-CM | POA: Diagnosis not present

## 2017-11-14 DIAGNOSIS — R748 Abnormal levels of other serum enzymes: Secondary | ICD-10-CM | POA: Diagnosis not present

## 2017-11-27 ENCOUNTER — Other Ambulatory Visit: Payer: Self-pay | Admitting: Family Medicine

## 2017-11-28 DIAGNOSIS — E213 Hyperparathyroidism, unspecified: Secondary | ICD-10-CM | POA: Diagnosis not present

## 2017-12-19 DIAGNOSIS — B9689 Other specified bacterial agents as the cause of diseases classified elsewhere: Secondary | ICD-10-CM | POA: Diagnosis not present

## 2017-12-19 DIAGNOSIS — D2261 Melanocytic nevi of right upper limb, including shoulder: Secondary | ICD-10-CM | POA: Diagnosis not present

## 2017-12-19 DIAGNOSIS — L821 Other seborrheic keratosis: Secondary | ICD-10-CM | POA: Diagnosis not present

## 2017-12-19 DIAGNOSIS — D2271 Melanocytic nevi of right lower limb, including hip: Secondary | ICD-10-CM | POA: Diagnosis not present

## 2017-12-19 DIAGNOSIS — D1801 Hemangioma of skin and subcutaneous tissue: Secondary | ICD-10-CM | POA: Diagnosis not present

## 2017-12-19 DIAGNOSIS — L918 Other hypertrophic disorders of the skin: Secondary | ICD-10-CM | POA: Diagnosis not present

## 2017-12-19 DIAGNOSIS — D225 Melanocytic nevi of trunk: Secondary | ICD-10-CM | POA: Diagnosis not present

## 2017-12-19 DIAGNOSIS — D2272 Melanocytic nevi of left lower limb, including hip: Secondary | ICD-10-CM | POA: Diagnosis not present

## 2017-12-19 DIAGNOSIS — D2262 Melanocytic nevi of left upper limb, including shoulder: Secondary | ICD-10-CM | POA: Diagnosis not present

## 2018-01-02 ENCOUNTER — Ambulatory Visit: Payer: 59 | Admitting: Family Medicine

## 2018-01-16 ENCOUNTER — Encounter: Payer: Self-pay | Admitting: Family Medicine

## 2018-01-16 ENCOUNTER — Other Ambulatory Visit: Payer: Self-pay

## 2018-01-16 ENCOUNTER — Ambulatory Visit: Payer: 59 | Admitting: Family Medicine

## 2018-01-16 VITALS — BP 100/70 | HR 62 | Temp 98.2°F | Ht 63.0 in | Wt 133.0 lb

## 2018-01-16 DIAGNOSIS — R5382 Chronic fatigue, unspecified: Secondary | ICD-10-CM

## 2018-01-16 DIAGNOSIS — E213 Hyperparathyroidism, unspecified: Secondary | ICD-10-CM | POA: Insufficient documentation

## 2018-01-16 DIAGNOSIS — R0609 Other forms of dyspnea: Secondary | ICD-10-CM

## 2018-01-16 DIAGNOSIS — E042 Nontoxic multinodular goiter: Secondary | ICD-10-CM | POA: Insufficient documentation

## 2018-01-16 DIAGNOSIS — E663 Overweight: Secondary | ICD-10-CM | POA: Diagnosis not present

## 2018-01-16 DIAGNOSIS — I10 Essential (primary) hypertension: Secondary | ICD-10-CM

## 2018-01-16 LAB — TSH: TSH: 4.46 u[IU]/mL (ref 0.35–4.50)

## 2018-01-16 LAB — VITAMIN B12: Vitamin B-12: 357 pg/mL (ref 211–911)

## 2018-01-16 NOTE — Patient Instructions (Signed)
Nice to see you. You can stop the losartan.  Please monitor your blood pressure and we will have you return in 2 weeks for recheck.  If your blood pressure goes up above 130/80 please let us know. Please follow-up with endocrinology as planned.

## 2018-01-16 NOTE — Assessment & Plan Note (Signed)
This resolved with weight loss.  Monitor for recurrence.

## 2018-01-16 NOTE — Assessment & Plan Note (Signed)
Really well controlled.  Discussed with the weight loss we could consider coming off of the losartan and see if her blood pressure is still well controlled.  She will monitor at home and return in 2 weeks for recheck.  If BP goes up she will let us know.

## 2018-01-16 NOTE — Assessment & Plan Note (Signed)
Benign appearance per report in care everywhere.  She is following with endocrinology.  We will check a TSH today.

## 2018-01-16 NOTE — Assessment & Plan Note (Signed)
Would seem to be primary hyperparathyroidism given possible parathyroid adenoma on ultrasound imaging.  She will continue to follow with endocrinology.  Suspect this certainly could be contributing to possible memory issues and fatigue though we will check a TSH and B12 to rule out other causes.

## 2018-01-16 NOTE — Assessment & Plan Note (Signed)
Patient is now in the normal range.  Congratulated on weight loss.  She is at a maintenance stage.  She will continue to monitor.

## 2018-01-16 NOTE — Progress Notes (Signed)
  Tommi Rumps, MD Phone: [phone]  Lindsey Carson is a 52 y.o. female who presents today for f/u.  HYPERTENSION  Disease Monitoring  Home BP Monitoring similar to today Chest pain- no    Dyspnea- no Medications  Compliance-  Taking losartan. Lightheadedness-  no  Edema- no  Patient has continued to lose weight with weight watchers.  She is walking some.  She is in a maintenance phase at this point and is trying to maintain her current weight.  She saw endocrinology for elevated alkaline phosphatase and was found to have an elevated PTH as well as mildly high calcium.  Her vitamin D was low.  They placed her on vitamin D supplementation.  She had an ultrasound that revealed a parathyroid tumor.  They are going to do lab work and then determine what the next step will be.  She does note her legs started to hurt after taking the vitamin D though that improved with adding magnesium.  She does note some memory difficulty at times that she describes as when she is driving she will end up somewhere that she did not mean to go though she is completely aware of what she is doing.  No other memory issues.  He does note some issues with feeling a little tired and fatigued at times.  Social History   Tobacco Use  Smoking Status Never Smoker  Smokeless Tobacco Never Used     ROS see history of present illness  Objective  Physical Exam Vitals:   01/16/18 0923  BP: 100/70  Pulse: 62  Temp: 98.2 F (36.8 C)  SpO2: 99%    BP Readings from Last 3 Encounters:  01/16/18 100/70  08/29/17 109/64  06/27/17 100/72   Wt Readings from Last 3 Encounters:  01/16/18 133 lb (60.3 kg)  08/29/17 145 lb (65.8 kg)  08/08/17 139 lb (63 kg)    Physical Exam  Constitutional: No distress.  Cardiovascular: Normal rate, regular rhythm and normal heart sounds.  Pulmonary/Chest: Effort normal and breath sounds normal.  Musculoskeletal: She exhibits no edema.  Neurological: She is alert. Gait normal.   Skin: Skin is warm and dry. She is not diaphoretic.     Assessment/Plan: Please see individual problem list.  Hypertension Really well controlled.  Discussed with the weight loss we could consider coming off of the losartan and see if her blood pressure is still well controlled.  She will monitor at home and return in 2 weeks for recheck.  If BP goes up she will let us know.  Overweight (BMI 25.0-29.9) Patient is now in the normal range.  Congratulated on weight loss.  She is at a maintenance stage.  She will continue to monitor.  Exertional dyspnea This resolved with weight loss.  Monitor for recurrence.  Hyperparathyroidism ([place]) Would seem to be primary hyperparathyroidism given possible parathyroid adenoma on ultrasound imaging.  She will continue to follow with endocrinology.  Suspect this certainly could be contributing to possible memory issues and fatigue though we will check a TSH and B12 to rule out other causes.  Multinodular goiter Benign appearance per report in care everywhere.  She is following with endocrinology.  We will check a TSH today.   Health Maintenance:   Orders Placed This Encounter  Procedures  . TSH  . B12    No orders of the defined types were placed in this encounter.    Tommi Rumps, MD [place]

## 2018-01-30 ENCOUNTER — Ambulatory Visit: Payer: 59 | Admitting: *Deleted

## 2018-01-30 VITALS — BP 104/68 | HR 69 | Resp 18

## 2018-01-30 DIAGNOSIS — Z6823 Body mass index (BMI) 23.0-23.9, adult: Secondary | ICD-10-CM | POA: Diagnosis not present

## 2018-01-30 DIAGNOSIS — Z01419 Encounter for gynecological examination (general) (routine) without abnormal findings: Secondary | ICD-10-CM | POA: Diagnosis not present

## 2018-01-30 DIAGNOSIS — I10 Essential (primary) hypertension: Secondary | ICD-10-CM

## 2018-01-30 DIAGNOSIS — Z1231 Encounter for screening mammogram for malignant neoplasm of breast: Secondary | ICD-10-CM | POA: Diagnosis not present

## 2018-01-30 NOTE — Progress Notes (Signed)
Patient in for 2 week follow up after stopping losartan BP taken in left arm 104/68 pulse 69. patient stated home BP have been around the same highest 119/68.

## 2018-02-13 DIAGNOSIS — Z13820 Encounter for screening for osteoporosis: Secondary | ICD-10-CM | POA: Diagnosis not present

## 2018-02-27 DIAGNOSIS — E559 Vitamin D deficiency, unspecified: Secondary | ICD-10-CM | POA: Diagnosis not present

## 2018-02-27 DIAGNOSIS — E213 Hyperparathyroidism, unspecified: Secondary | ICD-10-CM | POA: Diagnosis not present

## 2018-03-03 DIAGNOSIS — E213 Hyperparathyroidism, unspecified: Secondary | ICD-10-CM | POA: Diagnosis not present

## 2018-03-03 DIAGNOSIS — E559 Vitamin D deficiency, unspecified: Secondary | ICD-10-CM | POA: Diagnosis not present

## 2018-03-06 DIAGNOSIS — R748 Abnormal levels of other serum enzymes: Secondary | ICD-10-CM | POA: Diagnosis not present

## 2018-03-06 DIAGNOSIS — E559 Vitamin D deficiency, unspecified: Secondary | ICD-10-CM | POA: Diagnosis not present

## 2018-03-06 DIAGNOSIS — E213 Hyperparathyroidism, unspecified: Secondary | ICD-10-CM | POA: Diagnosis not present

## 2018-03-26 DIAGNOSIS — E210 Primary hyperparathyroidism: Secondary | ICD-10-CM | POA: Diagnosis not present

## 2018-03-26 DIAGNOSIS — E042 Nontoxic multinodular goiter: Secondary | ICD-10-CM | POA: Diagnosis not present

## 2018-03-28 ENCOUNTER — Other Ambulatory Visit: Payer: Self-pay | Admitting: Surgery

## 2018-03-28 DIAGNOSIS — E210 Primary hyperparathyroidism: Secondary | ICD-10-CM

## 2018-04-14 ENCOUNTER — Other Ambulatory Visit: Payer: Self-pay | Admitting: Surgery

## 2018-04-14 ENCOUNTER — Encounter
Admission: RE | Admit: 2018-04-14 | Discharge: 2018-04-14 | Disposition: A | Discharge status: Home / Self Care | Payer: 59 | Source: Ambulatory Visit | Attending: Surgery | Admitting: Surgery

## 2018-04-14 DIAGNOSIS — E210 Primary hyperparathyroidism: Secondary | ICD-10-CM | POA: Insufficient documentation

## 2018-04-14 DIAGNOSIS — E213 Hyperparathyroidism, unspecified: Secondary | ICD-10-CM | POA: Diagnosis not present

## 2018-04-14 MED ORDER — TECHNETIUM TC 99M SESTAMIBI GENERIC - CARDIOLITE
25.4900 | Freq: Once | INTRAVENOUS | Status: AC | PRN
  Administered 2018-04-14: 25.49 via INTRAVENOUS

## 2018-04-17 ENCOUNTER — Other Ambulatory Visit: Payer: Self-pay | Admitting: Surgery

## 2018-04-17 DIAGNOSIS — E210 Primary hyperparathyroidism: Secondary | ICD-10-CM

## 2018-05-02 ENCOUNTER — Other Ambulatory Visit: Payer: 59

## 2018-05-02 ENCOUNTER — Ambulatory Visit
Admission: RE | Admit: 2018-05-02 | Discharge: 2018-05-02 | Disposition: A | Discharge status: Home / Self Care | Payer: 59 | Source: Ambulatory Visit | Attending: Surgery | Admitting: Surgery

## 2018-05-02 DIAGNOSIS — E210 Primary hyperparathyroidism: Secondary | ICD-10-CM | POA: Diagnosis not present

## 2018-05-02 MED ORDER — IOPAMIDOL (ISOVUE-300) INJECTION 61%
75.0000 mL | Freq: Once | INTRAVENOUS | Status: AC | PRN
  Administered 2018-05-02: 75 mL via INTRAVENOUS

## 2018-05-07 ENCOUNTER — Other Ambulatory Visit: Payer: 59

## 2018-07-16 DIAGNOSIS — E210 Primary hyperparathyroidism: Secondary | ICD-10-CM | POA: Diagnosis not present

## 2018-07-22 ENCOUNTER — Ambulatory Visit: Payer: 59 | Admitting: Family Medicine

## 2018-07-28 ENCOUNTER — Ambulatory Visit: Payer: Self-pay | Admitting: Surgery

## 2018-07-28 DIAGNOSIS — E210 Primary hyperparathyroidism: Secondary | ICD-10-CM | POA: Diagnosis not present

## 2018-07-28 DIAGNOSIS — E042 Nontoxic multinodular goiter: Secondary | ICD-10-CM | POA: Diagnosis not present

## 2018-08-03 IMAGING — MG 2D DIGITAL DIAGNOSTIC UNILATERAL LEFT MAMMOGRAM WITH CAD AND ADJ
6 of 9 series · 6 of 21 positions shown · non-contrast
Comparison: Previous exam(s).

CLINICAL DATA: The patient was called back from screening
mammography due to a left breast asymmetry on the CC view.

EXAM:
2D DIGITAL DIAGNOSTIC UNILATERAL LEFT MAMMOGRAM WITH CAD AND ADJUNCT
TOMO

[L CC synth-2D (1 of 2)]
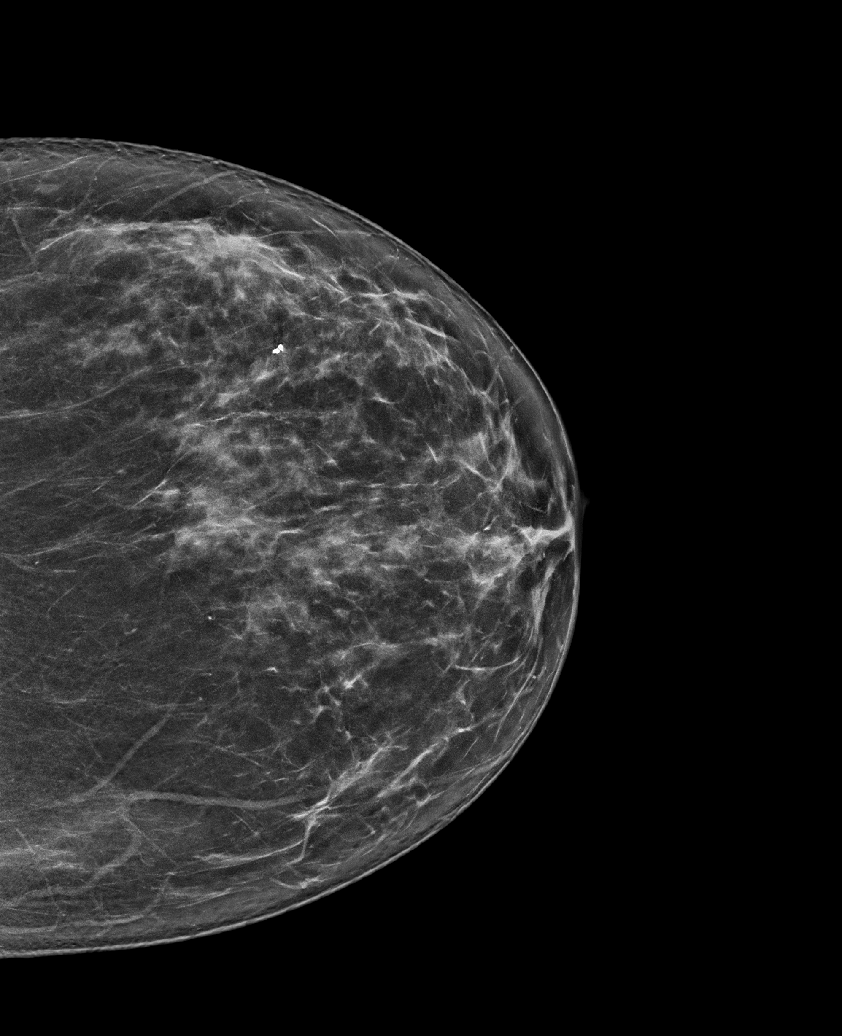

[L CC (1 of 2)]
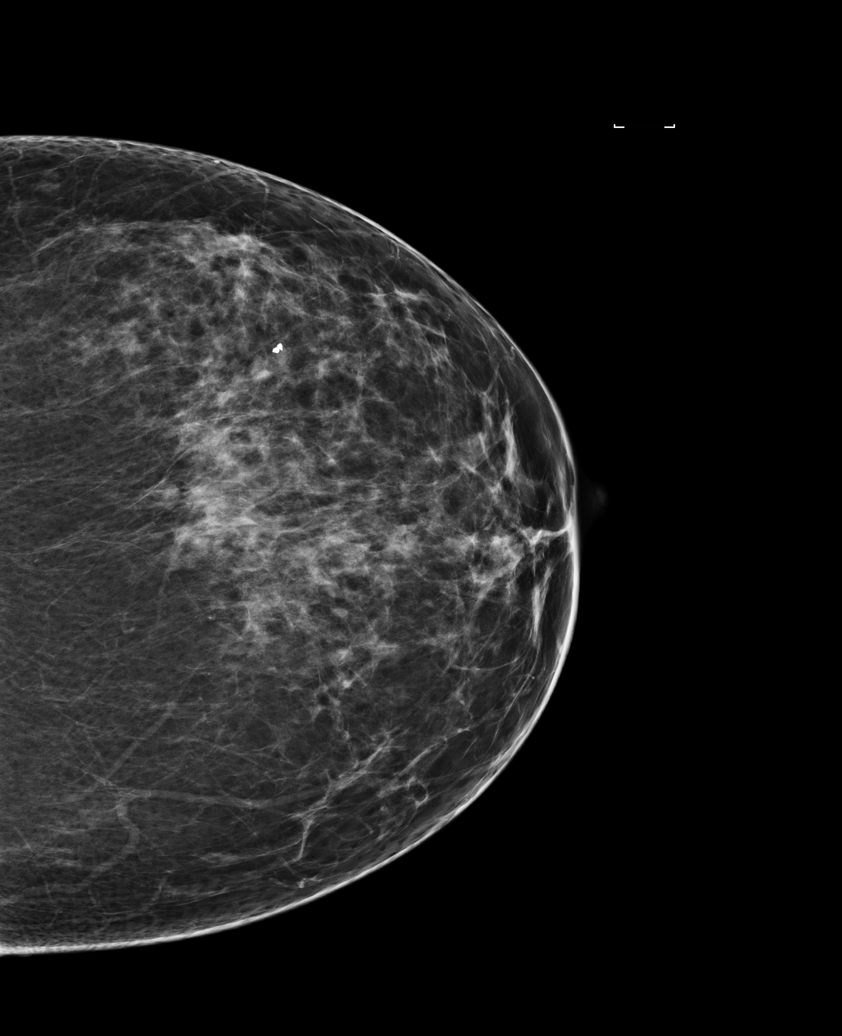

[L CC (2 of 2)]
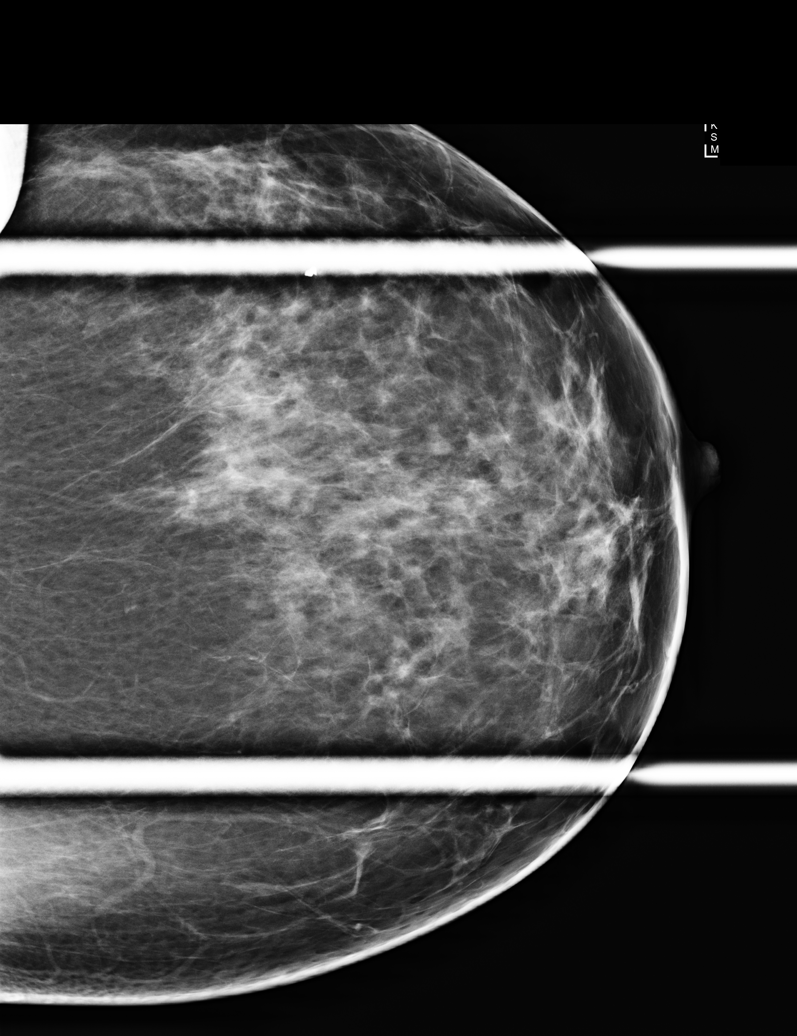

[L CC synth-2D (2 of 2)]
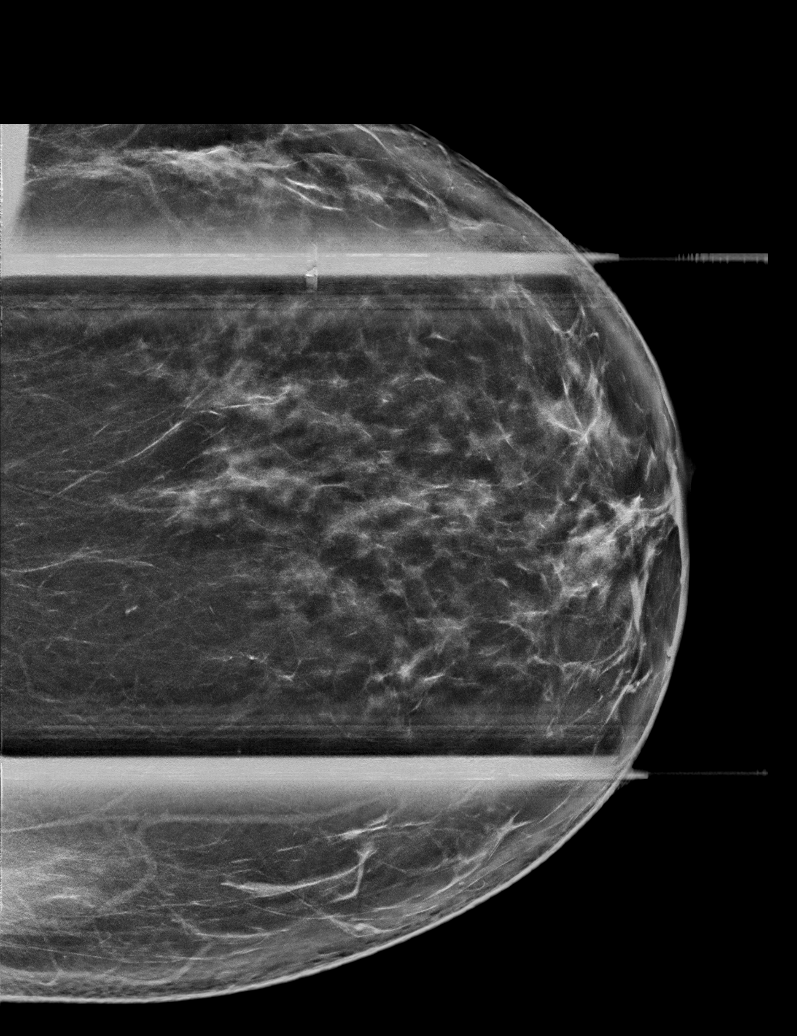

[L ML]
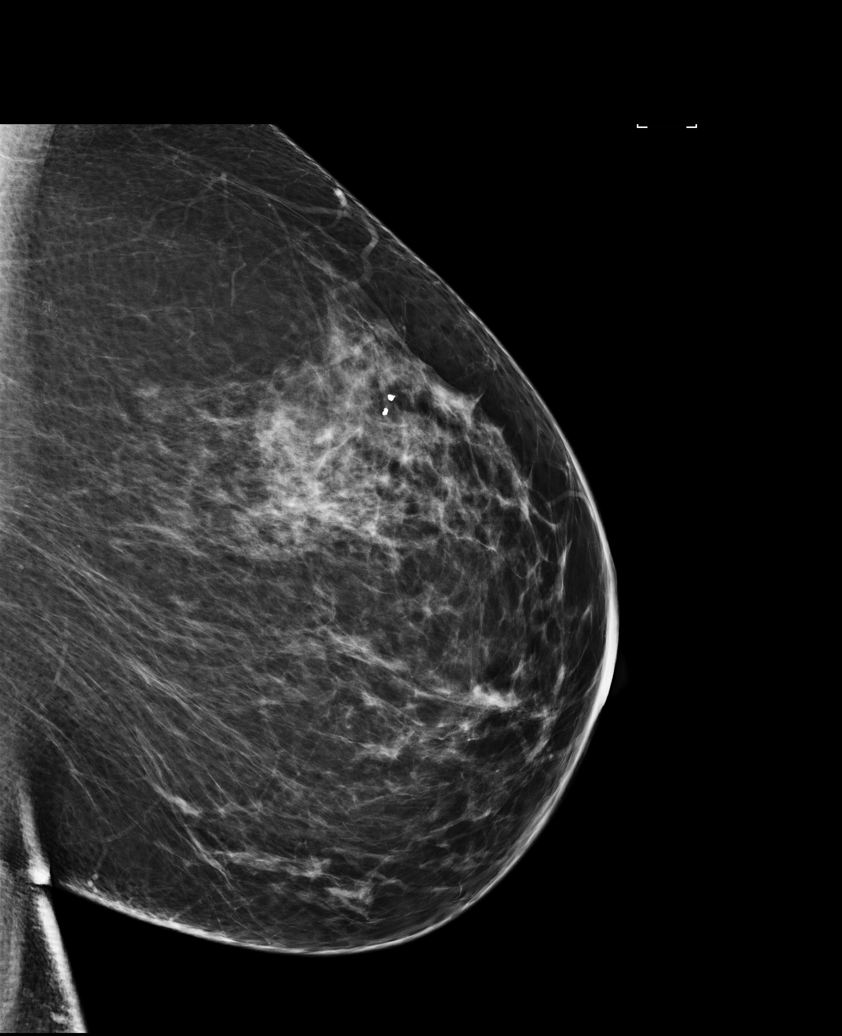

[L ML synth-2D]
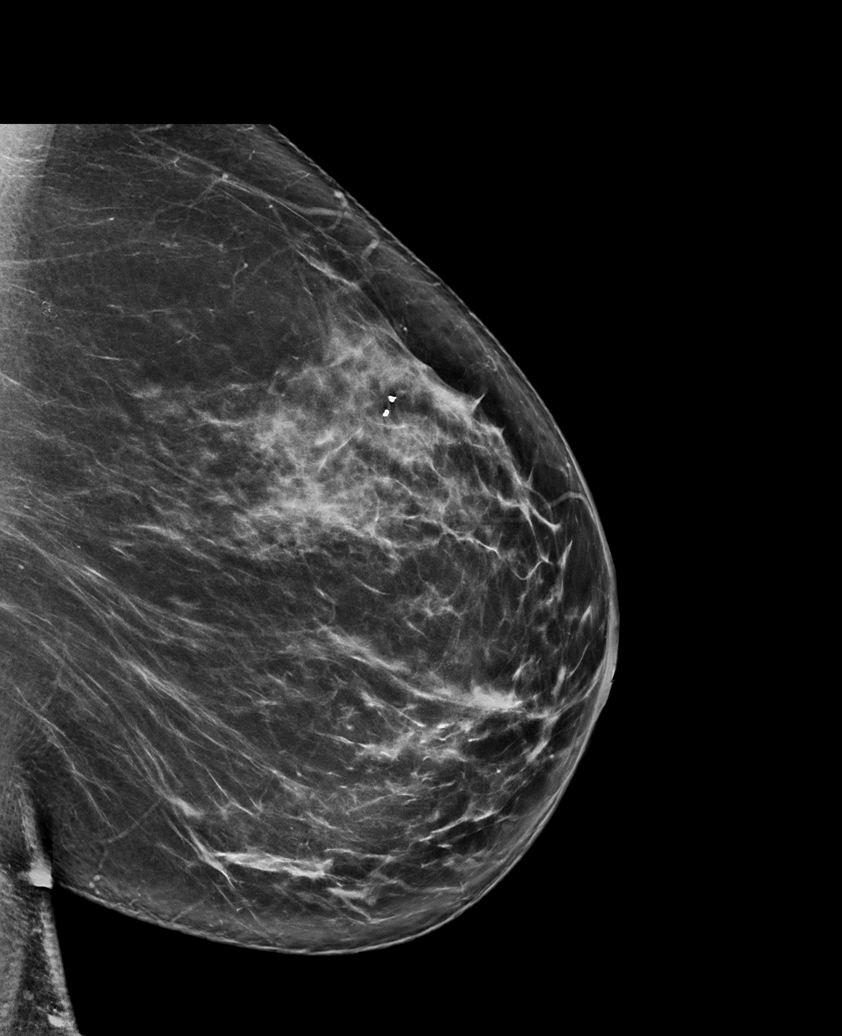

[6 of 21 positions shown; findings below may reference images not displayed]

ACR Breast Density Category c: The breast tissue is heterogeneously
dense, which may obscure small masses.
FINDINGS: The left breast asymmetry resolves on additional imaging.

Mammographic images were processed with CAD.
IMPRESSION: No mammographic evidence of malignancy.

RECOMMENDATION:
Annual screening mammography.

I have discussed the findings and recommendations with the patient.
Results were also provided in writing at the conclusion of the
visit. If applicable, a reminder letter will be sent to the patient
regarding the next appointment.

BI-RADS CATEGORY  2: Benign.

## 2018-08-14 DIAGNOSIS — H524 Presbyopia: Secondary | ICD-10-CM | POA: Diagnosis not present

## 2018-08-20 NOTE — Patient Instructions (Addendum)
Lindsey Carson  08/20/2018   Your procedure is scheduled on: 09-04-18  Report to [facility] Main  Entrance  Report to admitting at 530 AM    Call this number if you have problems the morning of surgery [phone]   Remember: Do not eat food or drink liquids :After Midnight. BRUSH YOUR TEETH MORNING OF SURGERY AND RINSE YOUR MOUTH OUT, NO CHEWING GUM CANDY OR MINTS.     Take these medicines the morning of surgery with A SIP OF WATER: CERTRIZINE (ZYRTEC), NASACORT NASAL SPRAY                               You may not have any metal on your body including hair pins and              piercings  Do not wear jewelry, make-up, lotions, powders or perfumes, deodorant             Do not wear nail polish.  Do not shave  48 hours prior to surgery.                 Do not bring valuables to the hospital. [place].  Contacts, dentures or bridgework may not be worn into surgery.  Leave suitcase in the car. After surgery it may be brought to your room.     Patients discharged the day of surgery will not be allowed to drive home.  Name and phone number of your driver: Karl Ito [phone]  Special Instructions: N/A              Please read over the following fact sheets you were given: _____________________________________________________________________             [facility] - Preparing for Surgery Before surgery, you can play an important role.  Because skin is not sterile, your skin needs to be as free of germs as possible.  You can reduce the number of germs on your skin by washing with CHG (chlorahexidine gluconate) soap before surgery.  CHG is an antiseptic cleaner which kills germs and bonds with the skin to continue killing germs even after washing. Please DO NOT use if you have an allergy to CHG or antibacterial soaps.  If your skin becomes reddened/irritated stop using the CHG and inform your nurse when you  arrive at Short Stay. Do not shave (including legs and underarms) for at least 48 hours prior to the first CHG shower.  You may shave your face/neck. Please follow these instructions carefully:  1.  Shower with CHG Soap the night before surgery and the  morning of Surgery.  2.  If you choose to wash your hair, wash your hair first as usual with your  normal  shampoo.  3.  After you shampoo, rinse your hair and body thoroughly to remove the  shampoo.                           4.  Use CHG as you would any other liquid soap.  You can apply chg directly  to the skin and wash  Gently with a scrungie or clean washcloth.  5.  Apply the CHG Soap to your body ONLY FROM THE NECK DOWN.   Do not use on face/ open                           Wound or open sores. Avoid contact with eyes, ears mouth and genitals (private parts).                       Wash face,  Genitals (private parts) with your normal soap.             6.  Wash thoroughly, paying special attention to the area where your surgery  will be performed.  7.  Thoroughly rinse your body with warm water from the neck down.  8.  DO NOT shower/wash with your normal soap after using and rinsing off  the CHG Soap.                9.  Pat yourself dry with a clean towel.            10.  Wear clean pajamas.            11.  Place clean sheets on your bed the night of your first shower and do not  sleep with pets. Day of Surgery : Do not apply any lotions/deodorants the morning of surgery.  Please wear clean clothes to the hospital/surgery center.  FAILURE TO FOLLOW THESE INSTRUCTIONS MAY RESULT IN THE CANCELLATION OF YOUR SURGERY PATIENT SIGNATURE_________________________________  NURSE SIGNATURE__________________________________  ________________________________________________________________________

## 2018-08-27 ENCOUNTER — Encounter (HOSPITAL_COMMUNITY)
Admission: RE | Admit: 2018-08-27 | Discharge: 2018-08-27 | Disposition: A | Discharge status: Home / Self Care | Payer: 59 | Source: Ambulatory Visit | Attending: Surgery | Admitting: Surgery

## 2018-08-27 ENCOUNTER — Other Ambulatory Visit: Payer: Self-pay

## 2018-08-27 ENCOUNTER — Encounter (HOSPITAL_COMMUNITY): Payer: Self-pay

## 2018-08-27 DIAGNOSIS — E210 Primary hyperparathyroidism: Secondary | ICD-10-CM | POA: Insufficient documentation

## 2018-08-27 DIAGNOSIS — Z01818 Encounter for other preprocedural examination: Secondary | ICD-10-CM | POA: Insufficient documentation

## 2018-08-27 HISTORY — DX: Headache, unspecified: R51.9

## 2018-08-27 HISTORY — DX: Headache: R51

## 2018-08-27 HISTORY — DX: Unspecified osteoarthritis, unspecified site: M19.90

## 2018-08-27 HISTORY — DX: Other specified postprocedural states: Z98.890

## 2018-08-27 HISTORY — DX: Nausea with vomiting, unspecified: R11.2

## 2018-08-27 LAB — CBC
HCT: 40.3 % (ref 36.0–46.0)
HEMOGLOBIN: 12.8 g/dL (ref 12.0–15.0)
MCH: 29.7 pg (ref 26.0–34.0)
MCHC: 31.8 g/dL (ref 30.0–36.0)
MCV: 93.5 fL (ref 80.0–100.0)
Platelets: 147 10*3/uL — ABNORMAL LOW (ref 150–400)
RBC: 4.31 MIL/uL (ref 3.87–5.11)
RDW: 12.4 % (ref 11.5–15.5)
WBC: 5.6 10*3/uL (ref 4.0–10.5)
nRBC: 0 % (ref 0.0–0.2)

## 2018-08-27 LAB — BASIC METABOLIC PANEL
Anion gap: 6 (ref 5–15)
BUN: 21 mg/dL — ABNORMAL HIGH (ref 6–20)
CHLORIDE: 108 mmol/L (ref 98–111)
CO2: 29 mmol/L (ref 22–32)
Calcium: 10.3 mg/dL (ref 8.9–10.3)
Creatinine, Ser: 0.7 mg/dL (ref 0.44–1.00)
GFR calc Af Amer: >60 mL/min (ref >60)
GFR calc non Af Amer: >60 mL/min (ref >60)
GLUCOSE: 85 mg/dL (ref 70–99)
POTASSIUM: 4.6 mmol/L (ref 3.5–5.1)
SODIUM: 143 mmol/L (ref 135–145)

## 2018-09-01 ENCOUNTER — Encounter (HOSPITAL_COMMUNITY): Payer: Self-pay | Admitting: Surgery

## 2018-09-01 NOTE — H&P (Signed)
General Surgery [facility]  Lindsey Carson DOB: [birth year] Married / Language: English / Race: White Female   History of Present Illness  The patient is a 52 year old female who presents with primary hyperparathyroidism.  CHIEF COMPLAINT: primary hyperparathyroidism  Patient returns to review her imaging studies and recent laboratory studies. Patient had an initial ultrasound done which suggested a left inferior parathyroid measuring 7 mm in greatest dimension. She subsequently underwent nuclear medicine parathyroid scan which was negative for adenoma. She then underwent a 4D CT scan which also failed to identify evidence of a parathyroid adenoma. Repeat laboratory studies on July 16, 2018 showed an elevated calcium level of 11.3, an inappropriately normal parathyroid hormone level of 47, and a normal 25-hydroxy vitamin D level is 47.4. The patient returns today to discuss parathyroid surgery.   Problem List/Past Medical  PRIMARY HYPERPARATHYROIDISM (E21.0)  MULTIPLE THYROID NODULES (E04.2)   Past Surgical History  Cesarean Section - Multiple  Spinal Surgery - Lower Back   Diagnostic Studies History Colonoscopy  within last year  Allergies  Lisinopril *ANTIHYPERTENSIVES*  Adhesive Tape 1"x10yd *MEDICAL DEVICES AND SUPPLIES*  Adhesive Tape  Augmentin *PENICILLINS*  Allergies Reconciled   Medication History Nasacort AQ (55MCG/ACT Aerosol Soln, Nasal) Active. Vitamin D (Ergocalciferol) (50000UNIT Capsule, Oral) Active. Aspir-Low (81MG  Tablet DR, Oral) Active. ZyrTEC (10MG  Tablet Chewable, Oral) Active. Magnesium (30MG  Tablet, Oral) Active. Medications Reconciled  Social History Alcohol use  Occasional alcohol use. Caffeine use  Carbonated beverages, Coffee. No drug use  Tobacco use  Never smoker.  Family History Arthritis  Mother. Diabetes Mellitus  Brother, Mother. Hypertension  Father, Mother. Thyroid problems   Mother.  Pregnancy / Birth History  Age at menarche  17 years. Age of menopause  58-55 Gravida  3 Maternal age  33-25 Para  2  Other Problems  Back Pain  Thyroid Disease   Vitals  Weight: 137.25 lb Height: 63in Body Surface Area: 1.65 m Body Mass Index: 24.31 kg/m  Temp.: 98.80F(Oral)  Pulse: 81 (Regular)  BP: 112/80 (Sitting, Left Arm, Standard)  Physical Exam  See vital signs recorded above  GENERAL APPEARANCE Development: normal Nutritional status: normal Gross deformities: none  SKIN Rash, lesions, ulcers: none Induration, erythema: none Nodules: none palpable  EYES Conjunctiva and lids: normal Pupils: equal and reactive Iris: normal bilaterally  EARS, NOSE, MOUTH, THROAT External ears: no lesion or deformity External nose: no lesion or deformity Hearing: grossly normal Lips: no lesion or deformity Dentition: normal for age Oral mucosa: moist  NECK Symmetric: yes Trachea: midline Thyroid: no palpable nodules in the thyroid bed  CHEST Respiratory effort: normal Retraction or accessory muscle use: no Breath sounds: normal bilaterally Rales, rhonchi, wheeze: none  CARDIOVASCULAR Auscultation: regular rhythm, normal rate Murmurs: none Pulses: carotid and radial pulse 2+ palpable Lower extremity edema: none Lower extremity varicosities: none  ABDOMEN Distension: none Masses: none palpable Tenderness: none Hepatosplenomegaly: not present Hernia: not present  GENITOURINARY Penis: no lesions Scrotum: no masses  MUSCULOSKELETAL Station and gait: normal Digits and nails: no clubbing or cyanosis Muscle strength: grossly normal all extremities Range of motion: grossly normal all extremities Deformity: none  LYMPHATIC Cervical: none palpable Supraclavicular: none palpable  PSYCHIATRIC Oriented to person, place, and time: yes Mood and affect: normal for situation Judgment and insight: appropriate for  situation    Assessment & Plan  PRIMARY HYPERPARATHYROIDISM (E21.0) MULTIPLE THYROID NODULES (E04.2)  Patient returns to review her imaging studies and recent laboratory studies.  Based on  her studies, I believe she does have primary hyperparathyroidism and the most likely location is a left inferior parathyroid adenoma. This was only suggested on the thyroid ultrasound examination. I have recommended proceeding with left inferior minimally invasive parathyroidectomy as an outpatient surgical procedure. We discussed the procedure and obtaining a frozen section biopsy during the course of the operation. If the parathyroid adenoma is not identified in the left inferior position, then we would extend our incision and perform a 4 gland exploration of the neck at the same setting. This would require an overnight hospital stay. The patient and I discussed this option today. We discussed other imaging modalities which might be possible to identify the location of a parathyroid adenoma. The patient would like to proceed with minimally invasive parathyroidectomy as outlined above.  The risks and benefits of the procedure have been discussed at length with the patient. The patient understands the proposed procedure, potential alternative treatments, and the course of recovery to be expected. All of the patient's questions have been answered at this time. The patient wishes to proceed with surgery.   Armandina Gemma, [place] [facility] Office: [phone]

## 2018-09-04 ENCOUNTER — Ambulatory Visit (HOSPITAL_COMMUNITY)
Admission: RE | Admit: 2018-09-04 | Discharge: 2018-09-04 | Disposition: A | Discharge status: Home / Self Care | Payer: 59 | Source: Ambulatory Visit | Attending: Surgery | Admitting: Surgery

## 2018-09-04 ENCOUNTER — Encounter (HOSPITAL_COMMUNITY)
Admission: RE | Disposition: A | Discharge status: Home / Self Care | Payer: Self-pay | Source: Ambulatory Visit | Attending: Surgery

## 2018-09-04 ENCOUNTER — Encounter (HOSPITAL_COMMUNITY): Payer: Self-pay

## 2018-09-04 ENCOUNTER — Ambulatory Visit (HOSPITAL_COMMUNITY): Payer: 59 | Admitting: Anesthesiology

## 2018-09-04 DIAGNOSIS — E213 Hyperparathyroidism, unspecified: Secondary | ICD-10-CM | POA: Diagnosis present

## 2018-09-04 DIAGNOSIS — Z7982 Long term (current) use of aspirin: Secondary | ICD-10-CM | POA: Insufficient documentation

## 2018-09-04 DIAGNOSIS — E042 Nontoxic multinodular goiter: Secondary | ICD-10-CM | POA: Diagnosis present

## 2018-09-04 DIAGNOSIS — I10 Essential (primary) hypertension: Secondary | ICD-10-CM | POA: Insufficient documentation

## 2018-09-04 DIAGNOSIS — E210 Primary hyperparathyroidism: Secondary | ICD-10-CM | POA: Diagnosis not present

## 2018-09-04 DIAGNOSIS — Z79899 Other long term (current) drug therapy: Secondary | ICD-10-CM | POA: Diagnosis not present

## 2018-09-04 DIAGNOSIS — D351 Benign neoplasm of parathyroid gland: Secondary | ICD-10-CM | POA: Diagnosis not present

## 2018-09-04 HISTORY — PX: PARATHYROIDECTOMY: SHX19

## 2018-09-04 SURGERY — PARATHYROIDECTOMY
Anesthesia: General | Site: Neck | Laterality: Left

## 2018-09-04 MED ORDER — LACTATED RINGERS IV SOLN: INTRAVENOUS | Status: DC

## 2018-09-04 MED ORDER — CIPROFLOXACIN IN D5W 400 MG/200ML IV SOLN
400.0000 mg | INTRAVENOUS | Status: AC
  Administered 2018-09-04: 400 mg via INTRAVENOUS
  Filled 2018-09-04: qty 200

## 2018-09-04 MED ORDER — LIDOCAINE 2% (20 MG/ML) 5 ML SYRINGE
INTRAMUSCULAR | Status: AC
  Filled 2018-09-04: qty 5

## 2018-09-04 MED ORDER — ROCURONIUM BROMIDE 50 MG/5ML IV SOSY
PREFILLED_SYRINGE | INTRAVENOUS | Status: DC | PRN
  Administered 2018-09-04: 10 mg via INTRAVENOUS
  Administered 2018-09-04: 50 mg via INTRAVENOUS

## 2018-09-04 MED ORDER — LACTATED RINGERS IV SOLN
INTRAVENOUS | Status: DC | PRN
  Administered 2018-09-04 (×2): via INTRAVENOUS

## 2018-09-04 MED ORDER — HYDROMORPHONE HCL 1 MG/ML IJ SOLN
INTRAMUSCULAR | Status: AC
  Filled 2018-09-04: qty 1

## 2018-09-04 MED ORDER — PROPOFOL 10 MG/ML IV BOLUS
INTRAVENOUS | Status: DC | PRN
  Administered 2018-09-04: 150 mg via INTRAVENOUS

## 2018-09-04 MED ORDER — FENTANYL CITRATE (PF) 100 MCG/2ML IJ SOLN
INTRAMUSCULAR | Status: DC | PRN
  Administered 2018-09-04 (×2): 50 ug via INTRAVENOUS

## 2018-09-04 MED ORDER — OXYCODONE HCL 5 MG/5ML PO SOLN
5.0000 mg | Freq: Once | ORAL | Status: DC | PRN
  Filled 2018-09-04: qty 5

## 2018-09-04 MED ORDER — HYDROMORPHONE HCL 1 MG/ML IJ SOLN
0.2500 mg | INTRAMUSCULAR | Status: DC | PRN
  Administered 2018-09-04 (×3): 0.5 mg via INTRAVENOUS

## 2018-09-04 MED ORDER — 0.9 % SODIUM CHLORIDE (POUR BTL) OPTIME
TOPICAL | Status: DC | PRN
  Administered 2018-09-04: 1000 mL

## 2018-09-04 MED ORDER — CHLORHEXIDINE GLUCONATE CLOTH 2 % EX PADS: 6.0000 | MEDICATED_PAD | Freq: Once | CUTANEOUS | Status: DC

## 2018-09-04 MED ORDER — SCOPOLAMINE 1 MG/3DAYS TD PT72
MEDICATED_PATCH | TRANSDERMAL | Status: AC
  Filled 2018-09-04: qty 1

## 2018-09-04 MED ORDER — ONDANSETRON HCL 4 MG/2ML IJ SOLN
INTRAMUSCULAR | Status: DC | PRN
  Administered 2018-09-04: 4 mg via INTRAVENOUS

## 2018-09-04 MED ORDER — DIPHENHYDRAMINE HCL 25 MG PO CAPS
ORAL_CAPSULE | ORAL | Status: AC
  Filled 2018-09-04: qty 1

## 2018-09-04 MED ORDER — ONDANSETRON HCL 4 MG/2ML IJ SOLN
INTRAMUSCULAR | Status: AC
  Filled 2018-09-04: qty 2

## 2018-09-04 MED ORDER — MEPERIDINE HCL 50 MG/ML IJ SOLN
6.2500 mg | INTRAMUSCULAR | Status: DC | PRN
  Administered 2018-09-04: 12.5 mg via INTRAVENOUS

## 2018-09-04 MED ORDER — MIDAZOLAM HCL 5 MG/5ML IJ SOLN
INTRAMUSCULAR | Status: DC | PRN
  Administered 2018-09-04: 2 mg via INTRAVENOUS

## 2018-09-04 MED ORDER — BUPIVACAINE HCL (PF) 0.25 % IJ SOLN
INTRAMUSCULAR | Status: DC | PRN
  Administered 2018-09-04: 8 mL

## 2018-09-04 MED ORDER — MIDAZOLAM HCL 2 MG/2ML IJ SOLN
INTRAMUSCULAR | Status: AC
  Filled 2018-09-04: qty 2

## 2018-09-04 MED ORDER — PROMETHAZINE HCL 25 MG/ML IJ SOLN
6.2500 mg | INTRAMUSCULAR | Status: DC | PRN
  Administered 2018-09-04: 6.25 mg via INTRAVENOUS

## 2018-09-04 MED ORDER — PHENYLEPHRINE 40 MCG/ML (10ML) SYRINGE FOR IV PUSH (FOR BLOOD PRESSURE SUPPORT)
PREFILLED_SYRINGE | INTRAVENOUS | Status: DC | PRN
  Administered 2018-09-04 (×3): 80 ug via INTRAVENOUS

## 2018-09-04 MED ORDER — SUGAMMADEX SODIUM 200 MG/2ML IV SOLN
INTRAVENOUS | Status: DC | PRN
  Administered 2018-09-04: 200 mg via INTRAVENOUS

## 2018-09-04 MED ORDER — PROPOFOL 10 MG/ML IV BOLUS
INTRAVENOUS | Status: AC
  Filled 2018-09-04: qty 40

## 2018-09-04 MED ORDER — SCOPOLAMINE 1 MG/3DAYS TD PT72
MEDICATED_PATCH | TRANSDERMAL | Status: DC | PRN
  Administered 2018-09-04: 1 via TRANSDERMAL

## 2018-09-04 MED ORDER — TRAMADOL HCL 50 MG PO TABS: 50.0000 mg | ORAL_TABLET | Freq: Four times a day (QID) | ORAL | 0 refills | Status: DC | PRN

## 2018-09-04 MED ORDER — BUPIVACAINE HCL (PF) 0.25 % IJ SOLN
INTRAMUSCULAR | Status: AC
  Filled 2018-09-04: qty 30

## 2018-09-04 MED ORDER — OXYCODONE HCL 5 MG PO TABS: 5.0000 mg | ORAL_TABLET | Freq: Once | ORAL | Status: DC | PRN

## 2018-09-04 MED ORDER — DEXAMETHASONE SODIUM PHOSPHATE 10 MG/ML IJ SOLN
INTRAMUSCULAR | Status: AC
  Filled 2018-09-04: qty 1

## 2018-09-04 MED ORDER — DEXAMETHASONE SODIUM PHOSPHATE 4 MG/ML IJ SOLN
INTRAMUSCULAR | Status: DC | PRN
  Administered 2018-09-04: 10 mg via INTRAVENOUS

## 2018-09-04 MED ORDER — LIDOCAINE 2% (20 MG/ML) 5 ML SYRINGE
INTRAMUSCULAR | Status: DC | PRN
  Administered 2018-09-04: 100 mg via INTRAVENOUS

## 2018-09-04 MED ORDER — MEPERIDINE HCL 50 MG/ML IJ SOLN
INTRAMUSCULAR | Status: AC
  Filled 2018-09-04: qty 1

## 2018-09-04 MED ORDER — DIPHENHYDRAMINE HCL 25 MG PO CAPS
25.0000 mg | ORAL_CAPSULE | Freq: Once | ORAL | Status: AC
  Administered 2018-09-04: 25 mg via ORAL

## 2018-09-04 MED ORDER — FENTANYL CITRATE (PF) 100 MCG/2ML IJ SOLN
INTRAMUSCULAR | Status: AC
  Filled 2018-09-04: qty 2

## 2018-09-04 MED ORDER — PROMETHAZINE HCL 25 MG/ML IJ SOLN
INTRAMUSCULAR | Status: AC
  Filled 2018-09-04: qty 1

## 2018-09-04 MED FILL — traMADol HCL 50 MG TABS: 50 | 2 days supply | Qty: 15 | Fill #0

## 2018-09-04 SURGICAL SUPPLY — 35 items
ADH SKN CLS APL DERMABOND .7 (GAUZE/BANDAGES/DRESSINGS) ×1
ATTRACTOMAT 16X20 MAGNETIC DRP (DRAPES) ×3 IMPLANT
BLADE SURG 15 STRL LF DISP TIS (BLADE) ×1 IMPLANT
BLADE SURG 15 STRL SS (BLADE) ×3
CHLORAPREP W/TINT 26ML (MISCELLANEOUS) ×3 IMPLANT
CLIP VESOCCLUDE MED 6/CT (CLIP) ×6 IMPLANT
CLIP VESOCCLUDE SM WIDE 6/CT (CLIP) ×6 IMPLANT
CLOSURE WOUND 1/2 X4 (GAUZE/BANDAGES/DRESSINGS)
COVER SURGICAL LIGHT HANDLE (MISCELLANEOUS) ×3 IMPLANT
COVER WAND RF STERILE (DRAPES) IMPLANT
DERMABOND ADVANCED (GAUZE/BANDAGES/DRESSINGS) ×2
DERMABOND ADVANCED .7 DNX12 (GAUZE/BANDAGES/DRESSINGS) IMPLANT
DRAPE LAPAROTOMY T 98X78 PEDS (DRAPES) ×3 IMPLANT
ELECT PENCIL ROCKER SW 15FT (MISCELLANEOUS) ×3 IMPLANT
ELECT REM PT RETURN 15FT ADLT (MISCELLANEOUS) ×3 IMPLANT
GAUZE 4X4 16PLY RFD (DISPOSABLE) ×3 IMPLANT
GOWN STRL REUS W/TWL XL LVL3 (GOWN DISPOSABLE) ×9 IMPLANT
HEMOSTAT SURGICEL 2X4 FIBR (HEMOSTASIS) ×2 IMPLANT
ILLUMINATOR WAVEGUIDE N/F (MISCELLANEOUS) IMPLANT
KIT BASIN OR (CUSTOM PROCEDURE TRAY) ×3 IMPLANT
LIGHT WAVEGUIDE WIDE FLAT (MISCELLANEOUS) IMPLANT
NDL HYPO 25X1 1.5 SAFETY (NEEDLE) ×1 IMPLANT
NEEDLE HYPO 25X1 1.5 SAFETY (NEEDLE) ×3 IMPLANT
PACK BASIC VI WITH GOWN DISP (CUSTOM PROCEDURE TRAY) ×3 IMPLANT
POWDER SURGICEL 3.0 GRAM (HEMOSTASIS) IMPLANT
STRIP CLOSURE SKIN 1/2X4 (GAUZE/BANDAGES/DRESSINGS) IMPLANT
SUT MNCRL AB 4-0 PS2 18 (SUTURE) ×3 IMPLANT
SUT SILK 2 0 (SUTURE)
SUT SILK 2-0 18XBRD TIE 12 (SUTURE) IMPLANT
SUT VIC AB 3-0 SH 18 (SUTURE) ×3 IMPLANT
SYR BULB IRRIGATION 50ML (SYRINGE) ×3 IMPLANT
SYR CONTROL 10ML LL (SYRINGE) ×3 IMPLANT
TOWEL OR 17X26 10 PK STRL BLUE (TOWEL DISPOSABLE) ×3 IMPLANT
TOWEL OR NON WOVEN STRL DISP B (DISPOSABLE) ×3 IMPLANT
YANKAUER SUCT BULB TIP 10FT TU (MISCELLANEOUS) ×3 IMPLANT

## 2018-09-04 NOTE — Interval H&P Note (Signed)
History and Physical Interval Note:  09/04/2018 7:20 AM  Lindsey Carson  has presented today for surgery, with the diagnosis of primary hyperparathyroidism.  The various methods of treatment have been discussed with the patient and family. After consideration of risks, benefits and other options for treatment, the patient has consented to    Procedure(s): LEFT INFERIOR PARATHYROIDECTOMY WITH POSSIBLE NECK EXPLORATION (Left) as a surgical intervention .    The patient's history has been reviewed, patient examined, no change in status, stable for surgery.  I have reviewed the patient's chart and labs.  Questions were answered to the patient's satisfaction.    Armandina Gemma, [place] [facility] Office: [place]

## 2018-09-04 NOTE — Op Note (Signed)
OPERATIVE REPORT - PARATHYROIDECTOMY  Preoperative diagnosis: Primary hyperparathyroidism  Postop diagnosis: Same  Procedure: Left inferior minimally invasive parathyroidectomy  Surgeon:  Armandina Gemma, MD  Anesthesia: General endotracheal  Estimated blood loss: Minimal  Preparation: ChloraPrep  Indications: Patient returns to review her imaging studies and recent laboratory studies. Patient had an initial ultrasound done which suggested a left inferior parathyroid measuring 7 mm in greatest dimension. She subsequently underwent nuclear medicine parathyroid scan which was negative for adenoma. She then underwent a 4D CT scan which also failed to identify evidence of a parathyroid adenoma. Repeat laboratory studies on July 16, 2018 showed an elevated calcium level of 11.3, an inappropriately normal parathyroid hormone level of 47, and a normal 25-hydroxy vitamin D level is 47.4. The patient returns today to discuss parathyroid surgery.  Procedure: The patient was prepared in the pre-operative holding area. The patient was brought to the operating room and placed in a supine position on the operating room table. Following administration of general anesthesia, the patient was positioned and then prepped and draped in the usual strict aseptic fashion. After ascertaining that an adequate level of anesthesia been achieved, a neck incision was made with a #15 blade. Dissection was carried through subcutaneous tissues and platysma. Hemostasis was obtained with the electrocautery. Skin flaps were developed circumferentially and a Weitlander retractor was placed for exposure.  Strap muscles were incised in the midline. Strap muscles were reflected lateralley exposing the thyroid lobe. With gentle blunt dissection the thyroid lobe was mobilized.  At the inferior pole of the thyroid gland was a small nodular mass consistent with the nodule seen on ultrasound.  It was gently mobilized. Vascular  structures were divided between small ligaclips. Care was taken to avoid the recurrent laryngeal nerve and the esophagus. The parathyroid gland was completely excised. It was submitted to pathology where frozen section confirmed hypercellular parathyroid tissue consistent with a small adenoma.  Exploration of the left thyrothymic tract revealed a 1 cm mass which on frozen section represented a benign lymph node.  Neck was irrigated with warm saline and good hemostasis was noted. Fibrillar was placed in the operative field. Strap muscles were reapproximated in the midline with interrupted 3-0 Vicryl sutures. Platysma was closed with interrupted 3-0 Vicryl sutures. Skin was closed with a running 4-0 Monocryl subcuticular suture. Marcaine was infiltrated circumferentially. Wound was washed and dried and Dermabond was applied. Patient was awakened from anesthesia and brought to the recovery room. The patient tolerated the procedure well.   Armandina Gemma, MD [facility] Office: [phone]

## 2018-09-04 NOTE — Anesthesia Preprocedure Evaluation (Signed)
Anesthesia Evaluation  Patient identified by MRN, date of birth, ID band Patient awake    Reviewed: Allergy & Precautions, H&P , NPO status , Patient's Chart, lab work & pertinent test results  History of Anesthesia Complications (+) PONV  Airway Mallampati: II  TM Distance: >3 FB Neck ROM: Full    Dental no notable dental hx. (+) Teeth Intact   Pulmonary    Pulmonary exam normal breath sounds clear to auscultation       Cardiovascular hypertension, Pt. on medications Normal cardiovascular exam Rhythm:Regular Rate:Normal     Neuro/Psych  Headaches,    GI/Hepatic   Endo/Other    Renal/GU      Musculoskeletal  (+) Arthritis , Osteoarthritis,    Abdominal   Peds  Hematology   Anesthesia Other Findings   Reproductive/Obstetrics                             Anesthesia Physical  Anesthesia Plan  ASA: II  Anesthesia Plan: General   Post-op Pain Management:    Induction: Intravenous  PONV Risk Score and Plan: 4 or greater and Ondansetron, Dexamethasone, Midazolam, Scopolamine patch - Pre-op and Treatment may vary due to age or medical condition  Airway Management Planned: Oral ETT  Additional Equipment:   Intra-op Plan:   Post-operative Plan: Extubation in OR  Informed Consent: I have reviewed the patients History and Physical, chart, labs and discussed the procedure including the risks, benefits and alternatives for the proposed anesthesia with the patient or authorized representative who has indicated his/her understanding and acceptance.     Plan Discussed with: CRNA and Surgeon  Anesthesia Plan Comments:         Anesthesia Quick Evaluation

## 2018-09-04 NOTE — Anesthesia Procedure Notes (Signed)
Procedure Name: Intubation Date/Time: 09/04/2018 7:37 AM Performed by: Lieutenant Diego, CRNA Pre-anesthesia Checklist: Patient identified, Emergency Drugs available, Suction available and Patient being monitored Patient Re-evaluated:Patient Re-evaluated prior to induction Oxygen Delivery Method: Circle system utilized Preoxygenation: Pre-oxygenation with 100% oxygen Induction Type: IV induction Ventilation: Mask ventilation without difficulty Laryngoscope Size: Miller and 2 Grade View: Grade I Tube type: Oral Tube size: 7.0 mm Number of attempts: 1 Airway Equipment and Method: Stylet and Oral airway Placement Confirmation: ETT inserted through vocal cords under direct vision,  positive ETCO2 and breath sounds checked- equal and bilateral Secured at: 22 cm Tube secured with: Tape Dental Injury: Teeth and Oropharynx as per pre-operative assessment

## 2018-09-04 NOTE — Transfer of Care (Signed)
Immediate Anesthesia Transfer of Care Note  Patient: Lindsey Carson  Procedure(s) Performed: LEFT INFERIOR PARATHYROIDECTOMY WITH  NECK EXPLORATION (Left Neck)  Patient Location: PACU  Anesthesia Type:General  Level of Consciousness: awake  Airway & Oxygen Therapy: Patient Spontanous Breathing and Patient connected to face mask oxygen  Post-op Assessment: Report given to RN and Post -op Vital signs reviewed and stable  Post vital signs: Reviewed and stable  Last Vitals:  Vitals Value Taken Time  BP    Temp    Pulse    Resp    SpO2      Last Pain:  Vitals:   09/04/18 0629  TempSrc:   PainSc: 0-No pain         Complications: No apparent anesthesia complications

## 2018-09-04 NOTE — Anesthesia Postprocedure Evaluation (Signed)
Anesthesia Post Note  Patient: Lindsey Carson  Procedure(s) Performed: LEFT INFERIOR PARATHYROIDECTOMY WITH  NECK EXPLORATION (Left Neck)     Patient location during evaluation: PACU Anesthesia Type: General Level of consciousness: awake and alert Pain management: pain level controlled Vital Signs Assessment: post-procedure vital signs reviewed and stable Respiratory status: spontaneous breathing, nonlabored ventilation and respiratory function stable Cardiovascular status: blood pressure returned to baseline and stable Postop Assessment: no apparent nausea or vomiting Anesthetic complications: no    Last Vitals:  Vitals:   09/04/18 0945 09/04/18 1000  BP: 125/85 131/79  Pulse: 75 71  Resp: 12 14  Temp:  (!) 36.4 C  SpO2: 98% 99%    Last Pain:  Vitals:   09/04/18 1000  TempSrc:   PainSc: [place]

## 2018-09-05 ENCOUNTER — Encounter (HOSPITAL_COMMUNITY): Payer: Self-pay | Admitting: Surgery

## 2018-09-16 DIAGNOSIS — E210 Primary hyperparathyroidism: Secondary | ICD-10-CM | POA: Diagnosis not present

## 2018-10-16 ENCOUNTER — Telehealth: Payer: 59 | Admitting: Family

## 2018-10-16 DIAGNOSIS — J00 Acute nasopharyngitis [common cold]: Secondary | ICD-10-CM

## 2018-10-16 MED ORDER — FLUTICASONE PROPIONATE 50 MCG/ACT NA SUSP: 1.0000 | Freq: Two times a day (BID) | NASAL | 6 refills | Status: DC

## 2018-10-16 MED ORDER — BENZONATATE 100 MG PO CAPS: 100.0000 mg | ORAL_CAPSULE | Freq: Three times a day (TID) | ORAL | 0 refills | Status: DC | PRN

## 2018-10-16 NOTE — Progress Notes (Signed)
Thank you for the details you included in the comment boxes. Those details are very helpful in determining the best course of treatment for you and help [place] to provide the best care.  We are sorry you are not feeling well.  Here is how we plan to help!  Based on what you have shared with me, it looks like you may have a viral upper respiratory infection or a "common cold".  Colds are caused by a large number of viruses; however, rhinovirus is the most common cause.   Symptoms of the common cold vary from person to person, with common symptoms including sore throat, cough, and malaise.  A low-grade fever of 100.4 may present, but is often uncommon.  Symptoms vary however, and are closely related to a person's age or underlying illnesses.  The most common symptoms associated with the common cold are nasal discharge or congestion, cough, sneezing, headache and pressure in the ears and face.  Cold symptoms usually persist for about 3 to 10 days, but can last up to 2 weeks.  It is important to know that colds do not cause serious illness or complications in most cases.    The common cold is transmitted from person to person, with the most common method of transmission being a person's hands.  The virus is able to live on the skin and can infect other persons for up to 2 hours after direct contact.  Also, colds are transmitted when someone coughs or sneezes; thus, it is important to cover the mouth to reduce this risk.  To keep the spread of the common cold at [place], good hand hygiene is very important.  This is an infection that is most likely caused by a virus. There are no specific treatments for the common cold other than to help you with the symptoms until the infection runs its course.    For nasal congestion, you may use an oral decongestants such as Mucinex D or if you have glaucoma or high blood pressure use plain Mucinex.  Saline nasal spray or nasal drops can help and can safely be used as often as  needed for congestion.  For your congestion, I have prescribed Fluticasone nasal spray one spray in each nostril twice a day  If you do not have a history of heart disease, hypertension, diabetes or thyroid disease, prostate/bladder issues or glaucoma, you may also use Sudafed to treat nasal congestion.  It is highly recommended that you consult with a pharmacist or your primary care physician to ensure this medication is safe for you to take.     If you have a cough, you may use cough suppressants such as Delsym and Robitussin.  If you have glaucoma or high blood pressure, you can also use Coricidin HBP.   For cough I have prescribed for you A prescription cough medication called Tessalon Perles 100 mg. You may take 1-2 capsules every 8 hours as needed for cough  If you have a sore or scratchy throat, use a saltwater gargle-  to  teaspoon of salt dissolved in a 4-ounce to 8-ounce glass of warm water.  Gargle the solution for approximately 15-30 seconds and then spit.  It is important not to swallow the solution.  You can also use throat lozenges/cough drops and Chloraseptic spray to help with throat pain or discomfort.  Warm or cold liquids can also be helpful in relieving throat pain.  For headache, pain or general discomfort, you can use Ibuprofen or Tylenol  as directed.   Some authorities believe that zinc sprays or the use of Echinacea may shorten the course of your symptoms.   HOME CARE . Only take medications as instructed by your medical team. . Be sure to drink plenty of fluids. Water is fine as well as fruit juices, sodas and electrolyte beverages. You may want to stay away from caffeine or alcohol. If you are nauseated, try taking small sips of liquids. How do you know if you are getting enough fluid? Your urine should be a pale yellow or almost colorless. . Get rest. . Taking a steamy shower or using a humidifier may help nasal congestion and ease sore throat pain. You can place a  towel over your head and breathe in the steam from hot water coming from a faucet. . Using a saline nasal spray works much the same way. . Cough drops, hard candies and sore throat lozenges may ease your cough. . Avoid close contacts especially the very young and the elderly . Cover your mouth if you cough or sneeze . Always remember to wash your hands.   GET HELP RIGHT AWAY IF: . You develop worsening fever. . If your symptoms do not improve within 10 days . You develop yellow or green discharge from your nose over 3 days. . You have coughing fits . You develop a severe head ache or visual changes. . You develop shortness of breath, difficulty breathing or start having chest pain . Your symptoms persist after you have completed your treatment plan  MAKE SURE YOU   Understand these instructions.  Will watch your condition.  Will get help right away if you are not doing well or get worse.  Your e-visit answers were reviewed by a board certified advanced clinical practitioner to complete your personal care plan. Depending upon the condition, your plan could have included both over the counter or prescription medications. Please review your pharmacy choice. If there is a problem, you may call our nursing hot line at and have the prescription routed to another pharmacy. Your safety is important to [place]. If you have drug allergies check your prescription carefully.   You can use MyChart to ask questions about today's visit, request a non-urgent call back, or ask for a work or school excuse for 24 hours related to this e-Visit. If it has been greater than 24 hours you will need to follow up with your provider, or enter a new e-Visit to address those concerns. You will get an e-mail in the next two days asking about your experience.  I hope that your e-visit has been valuable and will speed your recovery. Thank you for using e-visits.

## 2018-12-25 DIAGNOSIS — D2271 Melanocytic nevi of right lower limb, including hip: Secondary | ICD-10-CM | POA: Diagnosis not present

## 2018-12-25 DIAGNOSIS — D2239 Melanocytic nevi of other parts of face: Secondary | ICD-10-CM | POA: Diagnosis not present

## 2018-12-25 DIAGNOSIS — D2272 Melanocytic nevi of left lower limb, including hip: Secondary | ICD-10-CM | POA: Diagnosis not present

## 2018-12-25 DIAGNOSIS — D1801 Hemangioma of skin and subcutaneous tissue: Secondary | ICD-10-CM | POA: Diagnosis not present

## 2018-12-25 DIAGNOSIS — D692 Other nonthrombocytopenic purpura: Secondary | ICD-10-CM | POA: Diagnosis not present

## 2018-12-25 DIAGNOSIS — L821 Other seborrheic keratosis: Secondary | ICD-10-CM | POA: Diagnosis not present

## 2018-12-25 DIAGNOSIS — L918 Other hypertrophic disorders of the skin: Secondary | ICD-10-CM | POA: Diagnosis not present

## 2018-12-25 DIAGNOSIS — D225 Melanocytic nevi of trunk: Secondary | ICD-10-CM | POA: Diagnosis not present

## 2019-02-02 DIAGNOSIS — E210 Primary hyperparathyroidism: Secondary | ICD-10-CM | POA: Diagnosis not present

## 2019-02-26 DIAGNOSIS — Z1231 Encounter for screening mammogram for malignant neoplasm of breast: Secondary | ICD-10-CM | POA: Diagnosis not present

## 2019-02-26 DIAGNOSIS — Z6824 Body mass index (BMI) 24.0-24.9, adult: Secondary | ICD-10-CM | POA: Diagnosis not present

## 2019-02-26 DIAGNOSIS — Z01419 Encounter for gynecological examination (general) (routine) without abnormal findings: Secondary | ICD-10-CM | POA: Diagnosis not present

## 2019-03-02 ENCOUNTER — Other Ambulatory Visit: Payer: Self-pay | Admitting: Obstetrics and Gynecology

## 2019-03-02 DIAGNOSIS — R928 Other abnormal and inconclusive findings on diagnostic imaging of breast: Secondary | ICD-10-CM

## 2019-03-05 ENCOUNTER — Ambulatory Visit: Payer: 59

## 2019-03-05 ENCOUNTER — Other Ambulatory Visit: Payer: Self-pay

## 2019-03-05 ENCOUNTER — Ambulatory Visit
Admission: RE | Admit: 2019-03-05 | Discharge: 2019-03-05 | Disposition: A | Discharge status: Home / Self Care | Payer: 59 | Source: Ambulatory Visit | Attending: Obstetrics and Gynecology | Admitting: Obstetrics and Gynecology

## 2019-03-05 DIAGNOSIS — R921 Mammographic calcification found on diagnostic imaging of breast: Secondary | ICD-10-CM | POA: Diagnosis not present

## 2019-03-05 DIAGNOSIS — R928 Other abnormal and inconclusive findings on diagnostic imaging of breast: Secondary | ICD-10-CM

## 2019-04-02 ENCOUNTER — Other Ambulatory Visit: Payer: Self-pay

## 2019-04-06 ENCOUNTER — Other Ambulatory Visit: Payer: Self-pay

## 2019-04-06 ENCOUNTER — Ambulatory Visit (INDEPENDENT_AMBULATORY_CARE_PROVIDER_SITE_OTHER): Payer: 59 | Admitting: Family Medicine

## 2019-04-06 ENCOUNTER — Encounter: Payer: Self-pay | Admitting: Family Medicine

## 2019-04-06 VITALS — BP 120/80 | HR 75 | Temp 98.4°F | Ht 63.0 in | Wt 141.0 lb

## 2019-04-06 DIAGNOSIS — Z1329 Encounter for screening for other suspected endocrine disorder: Secondary | ICD-10-CM

## 2019-04-06 DIAGNOSIS — Z0001 Encounter for general adult medical examination with abnormal findings: Secondary | ICD-10-CM | POA: Diagnosis not present

## 2019-04-06 DIAGNOSIS — R51 Headache: Secondary | ICD-10-CM

## 2019-04-06 DIAGNOSIS — Z13220 Encounter for screening for lipoid disorders: Secondary | ICD-10-CM

## 2019-04-06 DIAGNOSIS — Z130 Encounter for screening for diseases of the blood and blood-forming organs and certain disorders involving the immune mechanism: Secondary | ICD-10-CM

## 2019-04-06 DIAGNOSIS — R519 Headache, unspecified: Secondary | ICD-10-CM

## 2019-04-06 DIAGNOSIS — G44209 Tension-type headache, unspecified, not intractable: Secondary | ICD-10-CM | POA: Insufficient documentation

## 2019-04-06 DIAGNOSIS — G8929 Other chronic pain: Secondary | ICD-10-CM

## 2019-04-06 LAB — COMPREHENSIVE METABOLIC PANEL
ALT: 12 U/L (ref 0–35)
AST: 15 U/L (ref 0–37)
Albumin: 4.2 g/dL (ref 3.5–5.2)
Alkaline Phosphatase: 107 U/L (ref 39–117)
BUN: 23 mg/dL (ref 6–23)
CO2: 25 mEq/L (ref 19–32)
Calcium: 10.3 mg/dL (ref 8.4–10.5)
Chloride: 108 mEq/L (ref 96–112)
Creatinine, Ser: 0.71 mg/dL (ref 0.40–1.20)
GFR: 86.07 mL/min (ref >60.00)
Glucose, Bld: 107 mg/dL — ABNORMAL HIGH (ref 70–99)
Potassium: 3.7 mEq/L (ref 3.5–5.1)
Sodium: 142 mEq/L (ref 135–145)
Total Bilirubin: 0.4 mg/dL (ref 0.2–1.2)
Total Protein: 6.6 g/dL (ref 6.0–8.3)

## 2019-04-06 LAB — CBC
HCT: 40.6 % (ref 36.0–46.0)
Hemoglobin: 14.3 g/dL (ref 12.0–15.0)
MCHC: 35.2 g/dL (ref 30.0–36.0)
MCV: 88.9 fl (ref 78.0–100.0)
Platelets: 158 10*3/uL (ref 150.0–400.0)
RBC: 4.57 Mil/uL (ref 3.87–5.11)
RDW: 12.7 % (ref 11.5–15.5)
WBC: 5.3 10*3/uL (ref 4.0–10.5)

## 2019-04-06 LAB — LIPID PANEL
Cholesterol: 195 mg/dL (ref 0–200)
HDL: 45.1 mg/dL (ref >39.00)
LDL Cholesterol: 125 mg/dL — ABNORMAL HIGH (ref 0–99)
NonHDL: 150.02
Total CHOL/HDL Ratio: 4
Triglycerides: 126 mg/dL (ref 0.0–149.0)
VLDL: 25.2 mg/dL (ref 0.0–40.0)

## 2019-04-06 LAB — TSH: TSH: 3.75 u[IU]/mL (ref 0.35–4.50)

## 2019-04-06 LAB — SEDIMENTATION RATE: Sed Rate: 4 mm/hr (ref 0–30)

## 2019-04-06 NOTE — Progress Notes (Signed)
Tommi Rumps, MD Phone: [phone]  Lindsey Carson is a 53 y.o. female who presents today for CPE.  Diet: Doing weight watchers. Exercise: She is doing walking about once a week.  She was walking 3-4 times a week. She follows with gynecology for Pap smears and breast exams.  Reports she is up-to-date on Pap smear.  She has not had a menstrual cycle in over a year. Colonoscopy is up-to-date 08/29/2017 with 5-year recall. Mammogram was completed through gynecology.  She did have a recall though she reports her diagnostic mammogram was unremarkable. No HIV screening. No family history of breast cancer or ovarian cancer.  She does have family history of colon cancer in her maternal uncle and grandfather. She is unsure of her last tetanus shot.  Discussed Shingrix. No tobacco use or illicit drug use.  Rare alcohol use. She does see dentist and an ophthalmologist.  Headache: Patient notes new onset headaches that have been going on for 8 months to a year.  She notes they start in her right neck and feel as though there is an ice pick there and then she developed right-sided headache with pressure behind her right eye.  She does note they do wake her from sleep at times.  She notes the headaches occur 1-2 times a month.  They improve with Excedrin.  She notes no vision changes, numbness, or weakness.  She has no history of headaches.  Active Ambulatory Problems    Diagnosis Date Noted  . Allergic rhinitis 10/27/2010  . Lumbar disc herniation with radiculopathy 09/08/2012  . Encounter for general adult medical examination with abnormal findings 12/20/2015  . Skin tag 12/20/2015  . Hypertension 12/27/2016  . Exertional dyspnea 12/27/2016  . Seborrheic keratosis 06/27/2017  . Hyperparathyroidism ([place]) 01/16/2018  . Multinodular goiter 01/16/2018  . New onset of headaches after age 10 04/06/2019   Resolved Ambulatory Problems    Diagnosis Date Noted  . Abdominal pain, epigastric 11/25/2015   . Encounter to establish care 11/25/2015  . Overweight (BMI 25.0-29.9) 06/27/2017   Past Medical History:  Diagnosis Date  . Allergy   . Arthritis   . Chronic allergic rhinitis   . Chronic back pain   . Elevated blood pressure   . Headache   . Heart murmur   . Hemorrhoids   . Joint pain   . PONV (postoperative nausea and vomiting)   . Seasonal allergies   . Sinus infection   . Spinal fracture YRS AGO    Family History  Problem Relation Age of Onset  . Diabetes Mother   . Hypertension Mother   . Hypothyroidism Mother   . Heart attack Father        MI  . Coronary artery disease Father   . Diabetes Father   . Hemachromatosis Father   . Diabetes Brother   . Hemachromatosis Brother   . Colon cancer Maternal Uncle   . Colon cancer Maternal Grandfather     Social History   Socioeconomic History  . Marital status: Married    Spouse name: Not on file  . Number of children: Not on file  . Years of education: Not on file  . Highest education level: Not on file  Occupational History  . 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 Financial resource strain: Not on file  . Food insecurity:    Worry: Not on file    Inability: Not on file  . Transportation needs:  Medical: Not on file    Non-medical: Not on file  Tobacco Use  . Smoking status: Never Smoker  . Smokeless tobacco: Never Used  Substance and Sexual Activity  . Alcohol use: No    Alcohol/week: 0.0 standard drinks  . Drug use: No  . Sexual activity: Yes    Birth control/protection: Post-menopausal  Lifestyle  . Physical activity:    Days per week: Not on file    Minutes per session: Not on file  . Stress: Not on file  Relationships  . Social connections:    Talks on phone: Not on file    Gets together: Not on file    Attends religious service: Not on file    Active member of club or organization: Not on file    Attends meetings of clubs or organizations: Not on file     Relationship status: Not on file  . Intimate partner violence:    Fear of current or ex partner: Not on file    Emotionally abused: Not on file    Physically abused: Not on file    Forced sexual activity: Not on file  Other Topics Concern  . Not on file  Social History Narrative   Pt is married   She has a Water quality scientist degree    She has 2 daughters     ROS  General:  Negative for nexplained weight loss, fever Skin: Negative for new or changing mole, sore that won't heal HEENT: Negative for trouble hearing, trouble seeing, ringing in ears, mouth sores, hoarseness, change in voice, dysphagia. CV:  Negative for chest pain, dyspnea, edema, palpitations Resp: Negative for cough, dyspnea, hemoptysis GI: Negative for nausea, vomiting, diarrhea, constipation, abdominal pain, melena, hematochezia. GU: Negative for dysuria, incontinence, urinary hesitance, hematuria, vaginal or penile discharge, polyuria, sexual difficulty, lumps in testicle or breasts MSK: Negative for muscle cramps or aches, joint pain or swelling Neuro: Positive for headaches, negative for weakness, numbness, dizziness, passing out/fainting Psych: Negative for depression, anxiety, memory problems  Objective  Physical Exam Vitals:   04/06/19 1330  BP: 120/80  Pulse: 75  Temp: 98.4 F (36.9 C)  SpO2: 98%    BP Readings from Last 3 Encounters:  04/06/19 120/80  09/04/18 138/80  08/27/18 131/88   Wt Readings from Last 3 Encounters:  04/06/19 141 lb (64 kg)  09/04/18 138 lb (62.6 kg)  08/27/18 138 lb (62.6 kg)    Physical Exam Constitutional:      General: She is not in acute distress.    Appearance: She is not diaphoretic.  HENT:     Head: Normocephalic and atraumatic.     Comments: No tenderness of either temple    Right Ear: Tympanic membrane normal.     Left Ear: Tympanic membrane normal.  Eyes:     Conjunctiva/sclera: Conjunctivae normal.     Pupils: Pupils are equal, round, and reactive to light.   Cardiovascular:     Rate and Rhythm: Normal rate and regular rhythm.     Heart sounds: Normal heart sounds.  Pulmonary:     Effort: Pulmonary effort is normal.     Breath sounds: Normal breath sounds.  Abdominal:     General: Bowel sounds are normal. There is no distension.     Palpations: Abdomen is soft. There is no mass.     Tenderness: There is no abdominal tenderness. There is no guarding or rebound.  Musculoskeletal:     Right lower leg: No edema.  Left lower leg: No edema.     Comments: No midline neck tenderness, no midline neck step-off, no muscular neck tenderness  Lymphadenopathy:     Cervical: No cervical adenopathy.  Skin:    General: Skin is warm and dry.  Neurological:     Mental Status: She is alert.     Comments: CN 2-12 intact, 5/5 strength in bilateral biceps, triceps, grip, quads, hamstrings, plantar and dorsiflexion, sensation to light touch intact in bilateral UE and LE, normal gait, 2+ patellar, Achilles, brachioradialis, and biceps reflexes  Psychiatric:        Mood and Affect: Mood normal.      Assessment/Plan:   Encounter for general adult medical examination with abnormal findings Physical exam completed.  Discussed increasing exercise.  Discussed continued dietary changes.  We will request records from her gynecologist.  She defers HIV screening.  She defers Shingrix vaccine until after COVID-19 pandemic is improved.  She will check with employee health regarding her tetanus vaccine.  Lab work as outlined below.  New onset of headaches after age 65 Headaches potentially could be tension in nature related to possible arthritis in her neck though given new onset after age 40 in association with waking her from sleep I do believe we need to rule out an underlying intracranial cause.  MRI has been ordered.  This will be scheduled for the patient.  We will check an ESR though I have low suspicion for temporal arteritis.  If evaluation is negative will treat  as tension headaches and consider evaluation of her neck as well.  Discussed that if she has any worsening of the headache or she develops any new symptoms or any neurological symptoms she would need to be evaluated in the emergency department.   Orders Placed This Encounter  Procedures  . MR Brain Wo Contrast    Standing Status:   Future    Standing Expiration Date:   06/05/2020    Order Specific Question:   What is the patient's sedation requirement?    Answer:   No Sedation    Order Specific Question:   Does the patient have a pacemaker or implanted devices?    Answer:   No    Order Specific Question:   Preferred imaging location?    Answer:   [facility] (table limit-400lbs)    Order Specific Question:   Radiology Contrast Protocol - do NOT remove file path    Answer:   [email or web address]  . Comp Met (CMET)  . Lipid panel  . Sedimentation rate  . TSH  . CBC    No orders of the defined types were placed in this encounter.    Tommi Rumps, MD [place]

## 2019-04-06 NOTE — Assessment & Plan Note (Signed)
Physical exam completed.  Discussed increasing exercise.  Discussed continued dietary changes.  We will request records from her gynecologist.  She defers HIV screening.  She defers Shingrix vaccine until after COVID-19 pandemic is improved.  She will check with employee health regarding her tetanus vaccine.  Lab work as outlined below.

## 2019-04-06 NOTE — Assessment & Plan Note (Signed)
Headaches potentially could be tension in nature related to possible arthritis in her neck though given new onset after age 53 in association with waking her from sleep I do believe we need to rule out an underlying intracranial cause.  MRI has been ordered.  This will be scheduled for the patient.  We will check an ESR though I have low suspicion for temporal arteritis.  If evaluation is negative will treat as tension headaches and consider evaluation of her neck as well.  Discussed that if she has any worsening of the headache or she develops any new symptoms or any neurological symptoms she would need to be evaluated in the emergency department.

## 2019-04-06 NOTE — Patient Instructions (Signed)
Nice to see you. Please continue to monitor your diet and start to work on exercise. We will request records from your gynecologist. Please check on your tetanus vaccine with employee health. We will get lab work today and contact you with the results. We will get you scheduled for an MRI of your brain given your headaches.

## 2019-04-20 ENCOUNTER — Ambulatory Visit: Payer: 59

## 2019-04-27 ENCOUNTER — Ambulatory Visit
Admission: RE | Admit: 2019-04-27 | Discharge: 2019-04-27 | Disposition: A | Discharge status: Home / Self Care | Payer: 59 | Source: Ambulatory Visit | Attending: Family Medicine | Admitting: Family Medicine

## 2019-04-27 ENCOUNTER — Other Ambulatory Visit: Payer: Self-pay

## 2019-04-27 DIAGNOSIS — R600 Localized edema: Secondary | ICD-10-CM | POA: Diagnosis not present

## 2019-04-27 DIAGNOSIS — R519 Headache, unspecified: Secondary | ICD-10-CM

## 2019-04-27 DIAGNOSIS — R51 Headache: Secondary | ICD-10-CM | POA: Insufficient documentation

## 2019-06-30 ENCOUNTER — Encounter: Payer: Self-pay | Admitting: Family Medicine

## 2019-06-30 DIAGNOSIS — M542 Cervicalgia: Secondary | ICD-10-CM

## 2019-07-03 ENCOUNTER — Ambulatory Visit (INDEPENDENT_AMBULATORY_CARE_PROVIDER_SITE_OTHER)
Admission: RE | Admit: 2019-07-03 | Discharge: 2019-07-03 | Disposition: A | Discharge status: Home / Self Care | Payer: 59 | Source: Ambulatory Visit | Attending: Family Medicine | Admitting: Family Medicine

## 2019-07-03 ENCOUNTER — Other Ambulatory Visit: Payer: Self-pay

## 2019-07-03 DIAGNOSIS — M542 Cervicalgia: Secondary | ICD-10-CM | POA: Diagnosis not present

## 2019-07-08 ENCOUNTER — Encounter: Payer: Self-pay | Admitting: Family Medicine

## 2019-07-09 MED ORDER — NORTRIPTYLINE HCL 10 MG PO CAPS: 10.0000 mg | ORAL_CAPSULE | Freq: Every day | ORAL | 1 refills | Status: DC

## 2019-07-23 DIAGNOSIS — L821 Other seborrheic keratosis: Secondary | ICD-10-CM | POA: Diagnosis not present

## 2019-07-23 DIAGNOSIS — D2239 Melanocytic nevi of other parts of face: Secondary | ICD-10-CM | POA: Diagnosis not present

## 2019-07-23 DIAGNOSIS — L820 Inflamed seborrheic keratosis: Secondary | ICD-10-CM | POA: Diagnosis not present

## 2019-08-27 DIAGNOSIS — E210 Primary hyperparathyroidism: Secondary | ICD-10-CM | POA: Diagnosis not present

## 2019-09-08 ENCOUNTER — Other Ambulatory Visit: Payer: Self-pay | Admitting: Family Medicine

## 2019-10-09 ENCOUNTER — Ambulatory Visit: Payer: 59 | Admitting: Family Medicine

## 2019-10-13 ENCOUNTER — Encounter: Payer: Self-pay | Admitting: Family Medicine

## 2019-10-19 NOTE — Telephone Encounter (Signed)
There isn't a no show fee on this patients transactions.

## 2019-11-05 DIAGNOSIS — H00011 Hordeolum externum right upper eyelid: Secondary | ICD-10-CM | POA: Diagnosis not present

## 2019-11-16 ENCOUNTER — Encounter: Payer: Self-pay | Admitting: Family Medicine

## 2019-11-16 NOTE — Telephone Encounter (Signed)
I attempted to call the patient regarding her mychart message. There was no answer after numerous rings on the phone. I will send her a mychart message.

## 2019-11-18 MED ORDER — PREDNISONE 20 MG PO TABS: 40.0000 mg | ORAL_TABLET | Freq: Every day | ORAL | 0 refills | Status: DC

## 2019-11-18 NOTE — Addendum Note (Signed)
Addended by: Caryl Bis, Zoee Heeney G on: 11/18/2019 12:22 PM   Modules accepted: Orders

## 2019-11-20 ENCOUNTER — Encounter: Payer: Self-pay | Admitting: Family Medicine

## 2019-11-20 ENCOUNTER — Other Ambulatory Visit: Payer: Self-pay

## 2019-11-20 ENCOUNTER — Ambulatory Visit (INDEPENDENT_AMBULATORY_CARE_PROVIDER_SITE_OTHER): Payer: 59 | Admitting: Family Medicine

## 2019-11-20 DIAGNOSIS — M545 Low back pain, unspecified: Secondary | ICD-10-CM

## 2019-11-20 DIAGNOSIS — G44209 Tension-type headache, unspecified, not intractable: Secondary | ICD-10-CM | POA: Diagnosis not present

## 2019-11-20 DIAGNOSIS — I10 Essential (primary) hypertension: Secondary | ICD-10-CM | POA: Diagnosis not present

## 2019-11-20 DIAGNOSIS — G8929 Other chronic pain: Secondary | ICD-10-CM | POA: Insufficient documentation

## 2019-11-20 MED ORDER — NORTRIPTYLINE HCL 10 MG PO CAPS: 10.0000 mg | ORAL_CAPSULE | Freq: Every day | ORAL | 1 refills | Status: DC

## 2019-11-20 NOTE — Progress Notes (Signed)
Virtual Visit via video Note  This visit type was conducted due to national recommendations for restrictions regarding the COVID-19 pandemic (e.g. social distancing).  This format is felt to be most appropriate for this patient at this time.  All issues noted in this document were discussed and addressed.  No physical exam was performed (except for noted visual exam findings with Video Visits).   I connected with Lindsey Carson today at 11:30 AM EST by a video enabled telemedicine application and verified that I am speaking with the correct person using two identifiers. Location patient: home Location provider: work or home office Persons participating in the virtual visit: patient, provider  I discussed the limitations, risks, security and privacy concerns of performing an evaluation and management service by telephone and the availability of in person appointments. I also discussed with the patient that there may be a patient responsible charge related to this service. The patient expressed understanding and agreed to proceed.   Reason for visit: follow-up  HPI: Hypertension: Typically low 140s over low 90s.  She is no longer on medication.  No chest pain, shortness of breath, or edema.  She notes she has gotten away from exercising and has put around 20 pounds on with the COVID-19 pandemic.  Back pain: Patient notes she got out of bed about 1.5 weeks ago and felt like there was a numb grabbing sensation in her posterior right thigh.  Notes her leg does feel weak at times as well.  She then developed right low back discomfort.  She notes no edema.  No incontinence.  No saddle anesthesia.  She has discussed with a neurosurgery PA and has an appointment with them next week.  She has been taking prednisone which has been beneficial over the last 2 days.  She does have a history of disc issues.  Notes the numb sensation and weakness improve as the day goes on when she is up moving around.  Tension  headaches: Patient has been taking nortriptyline.  She notes it does not make her drowsy.  It has been quite beneficial.  She only occasionally has headaches now.  MRI previously unremarkable for cause of headaches.  Patient reports she is getting her second Covid vaccine today.  She wonders if being on the prednisone will be an issue.  ROS: See pertinent positives and negatives per HPI.  Past Medical History:  Diagnosis Date  . Allergy   . Arthritis    OA  . Chronic allergic rhinitis    Severe, year long  . Chronic back pain    HNP - history  . Elevated blood pressure    History of  . Headache   . Heart murmur    was told but doesn't require meds - never has been a problem  . Hemorrhoids    History OF  . Hypertension    OFF BP MEDS FOR 6 MONTHS  . Joint pain    hands and knees, hips and shoulders  . PONV (postoperative nausea and vomiting)   . Seasonal allergies    uses nasocort daily and zyrtec  . Sinus infection    History of frequent  . Spinal fracture YRS AGO   L5/S1    Past Surgical History:  Procedure Laterality Date  . [place]   x 2   . COLONSCOPY  08/2017  . DILATION AND CURETTAGE OF UTERUS  08/22/2017  . LUMBAR LAMINECTOMY/DECOMPRESSION MICRODISCECTOMY  09/08/2012   Procedure: LUMBAR LAMINECTOMY/DECOMPRESSION  MICRODISCECTOMY 1 LEVEL;  Surgeon: Charlie Pitter, MD;  Location: [facility];  Service: Neurosurgery;  Laterality: Left;  left Lumbar Five-Sacral one laminectomy/microdiscectomy  . PARATHYROIDECTOMY Left 09/04/2018   Procedure: LEFT INFERIOR PARATHYROIDECTOMY WITH  NECK EXPLORATION;  Surgeon: Armandina Gemma, MD;  Location: [facility];  Service: General;  Laterality: Left;  . WISDOM TOOTH EXTRACTION  1984    Family History  Problem Relation Age of Onset  . Diabetes Mother   . Hypertension Mother   . Hypothyroidism Mother   . Heart attack Father        MI  . Coronary artery disease Father   . Diabetes Father   . Hemachromatosis  Father   . Diabetes Brother   . Hemachromatosis Brother   . Colon cancer Maternal Uncle   . Colon cancer Maternal Grandfather     SOCIAL HX: Non-smoker.   Current Outpatient Medications:  .  aspirin EC 81 MG tablet, Take 1 tablet (81 mg total) by mouth daily. (Patient taking differently: Take 81 mg by mouth at bedtime. ), Disp: 90 tablet, Rfl: 3 .  cetirizine (ZYRTEC ALLERGY) 10 MG tablet, Take 10 mg by mouth at bedtime. , Disp: , Rfl:  .  Cholecalciferol (VITAMIN D) 2000 units CAPS, Take by mouth at bedtime., Disp: , Rfl:  .  fluticasone (FLONASE) 50 MCG/ACT nasal spray, Place 1 spray into both nostrils 2 (two) times daily., Disp: 16 g, Rfl: 6 .  Magnesium 250 MG TABS, Take 250 mg by mouth at bedtime., Disp: , Rfl:  .  nortriptyline (PAMELOR) 10 MG capsule, Take 1 capsule (10 mg total) by mouth at bedtime., Disp: 90 capsule, Rfl: 1 .  predniSONE (DELTASONE) 20 MG tablet, Take 2 tablets (40 mg total) by mouth daily with breakfast., Disp: 10 tablet, Rfl: 0 .  aspirin-acetaminophen-caffeine (EXCEDRIN MIGRAINE) 250-250-65 MG tablet, Take 2 tablets by mouth daily as needed for headache., Disp: , Rfl:  .  benzonatate (TESSALON PERLES) 100 MG capsule, Take 1-2 capsules (100-200 mg total) by mouth every 8 (eight) hours as needed for cough., Disp: 30 capsule, Rfl: 0 .  losartan (COZAAR) 50 MG tablet, TAKE 1 TABLET BY MOUTH DAILY, Disp: 30 tablet, Rfl: 2 .  traMADol (ULTRAM) 50 MG tablet, Take 1-2 tablets (50-100 mg total) by mouth every 6 (six) hours as needed., Disp: 15 tablet, Rfl: 0 .  triamcinolone (NASACORT AQ) 55 MCG/ACT nasal inhaler, Place 1 spray into the nose daily. , Disp: , Rfl:  .  Vitamin D, Ergocalciferol, (DRISDOL) 50000 units CAPS capsule, Take 50,000 Units by mouth every Monday., Disp: , Rfl:   EXAM:  VITALS per patient if applicable:  GENERAL: alert, oriented, appears well and in no acute distress  HEENT: atraumatic, conjunttiva clear, no obvious abnormalities on inspection  of external nose and ears  NECK: normal movements of the head and neck  LUNGS: on inspection no signs of respiratory distress, breathing rate appears normal, no obvious gross SOB, gasping or wheezing  CV: no obvious cyanosis  MS: moves all visible extremities without noticeable abnormality  PSYCH/NEURO: pleasant and cooperative, no obvious depression or anxiety, speech and thought processing grossly intact  ASSESSMENT AND PLAN:  Discussed the following assessment and plan:  Low back pain Concern for nerve impingement given described symptoms.  Symptoms are improving with prednisone.  She will finish her course of prednisone.  She will see the neurosurgery team as planned.  Discussed she would likely need a MRI.  Hypertension Above goal.  No  longer on medication.  Discussed option of medication versus extensive diet and exercise.  She opted to work on diet and exercise and we will follow-up in 2 months.  Discussed if not at goal then we would need to consider medication.  Tension headache Much improved with nortriptyline.  Refill given.   Discussed that the prednisone could potentially decrease her immune system though the short course would make that much less likely.  Advised that she should let the vaccine administration people know that she is on prednisone when she goes for her vaccine.  Advised that she should proceed with the vaccine.  No orders of the defined types were placed in this encounter.   Meds ordered this encounter  Medications  . nortriptyline (PAMELOR) 10 MG capsule    Sig: Take 1 capsule (10 mg total) by mouth at bedtime.    Dispense:  90 capsule    Refill:  1     I discussed the assessment and treatment plan with the patient. The patient was provided an opportunity to ask questions and all were answered. The patient agreed with the plan and demonstrated an understanding of the instructions.   The patient was advised to call back or seek an in-person  evaluation if the symptoms worsen or if the condition fails to improve as anticipated.   Tommi Rumps, MD

## 2019-11-20 NOTE — Assessment & Plan Note (Signed)
Above goal.  No longer on medication.  Discussed option of medication versus extensive diet and exercise.  She opted to work on diet and exercise and we will follow-up in 2 months.  Discussed if not at goal then we would need to consider medication.

## 2019-11-20 NOTE — Assessment & Plan Note (Signed)
Concern for nerve impingement given described symptoms.  Symptoms are improving with prednisone.  She will finish her course of prednisone.  She will see the neurosurgery team as planned.  Discussed she would likely need a MRI.

## 2019-11-20 NOTE — Assessment & Plan Note (Signed)
Much improved with nortriptyline.  Refill given.

## 2019-11-24 ENCOUNTER — Other Ambulatory Visit: Payer: Self-pay | Admitting: Student

## 2019-11-24 DIAGNOSIS — M5441 Lumbago with sciatica, right side: Secondary | ICD-10-CM

## 2019-11-24 DIAGNOSIS — R200 Anesthesia of skin: Secondary | ICD-10-CM

## 2019-11-26 ENCOUNTER — Telehealth: Payer: Self-pay | Admitting: Family Medicine

## 2019-11-26 DIAGNOSIS — I10 Essential (primary) hypertension: Secondary | ICD-10-CM

## 2019-11-26 MED ORDER — LOSARTAN POTASSIUM 50 MG PO TABS: 50.0000 mg | ORAL_TABLET | Freq: Every day | ORAL | 2 refills | Status: DC

## 2019-11-26 NOTE — Telephone Encounter (Signed)
Pt would like Lindsey Carson to call her back about two issues; She wanted to speak to her about her BP, she already spoke to Dr. Caryl Bis about this recently. Also; she was on a predniSONE (DELTASONE) 20 MG tablet and wondering if you can have withdrawals from that. Please call her at [phone].

## 2019-11-26 NOTE — Telephone Encounter (Signed)
I called and spoke with the patient and informed her that the provider does not think that the prednisone is causing withdrawal.  Patient stated she did have her 1st dose of the covid vaccine last Saturday but she is not experiencing any SOB or chest pain.  She is willing to start the losartan and she is scheduled for a lab appointment in 10 days.  Lindsey Carson,cma

## 2019-11-26 NOTE — Telephone Encounter (Signed)
She should not be withdrawing from a 5 day course of prednisone. Is she having any other symptoms? Chest pain, Shortness of breath? Did she just get the COVID19 vaccine? I am fine starting her on losartan 50 mg once daily again. She would need repeat labs in 7-10 days after restarting this medication.

## 2019-11-26 NOTE — Telephone Encounter (Signed)
Noted.  It would be odd for those symptoms to occur this far after the vaccine.  If those symptoms are continuing she needs to be seen in person for an exam to help determine if her blood pressure would be contributing or some other cause is contributing.  I will send in the losartan and place orders for labs

## 2019-11-26 NOTE — Telephone Encounter (Signed)
I called and spoke with the patient and informed her that if she is still having symptoms she needs to be seen and I informed her that the losartan was sent to the pharmacy.   Patient understood.  Sutton Plake,cma

## 2019-11-26 NOTE — Telephone Encounter (Signed)
I called the patient and she is stating that since sh stopped the prednisone she is experiencing dizziness, nausea and very jittery and she wants to know could this be a withdrawal symptom, also she stated you and her had spoke about waiting 2 months to check on her BP before starting a medication and the patient stated that her BP is still elevated the bottom number is running 90-100 daily and she does not want to wait to start the medication.  Please advise.  Lindsey Carson,cma

## 2019-12-03 ENCOUNTER — Other Ambulatory Visit: Payer: Self-pay

## 2019-12-03 ENCOUNTER — Ambulatory Visit
Admission: RE | Admit: 2019-12-03 | Discharge: 2019-12-03 | Disposition: A | Discharge status: Home / Self Care | Payer: 59 | Source: Ambulatory Visit | Attending: Student | Admitting: Student

## 2019-12-03 DIAGNOSIS — M5441 Lumbago with sciatica, right side: Secondary | ICD-10-CM | POA: Diagnosis present

## 2019-12-03 DIAGNOSIS — M545 Low back pain: Secondary | ICD-10-CM | POA: Diagnosis not present

## 2019-12-03 DIAGNOSIS — R200 Anesthesia of skin: Secondary | ICD-10-CM | POA: Insufficient documentation

## 2019-12-07 DIAGNOSIS — S92501A Displaced unspecified fracture of right lesser toe(s), initial encounter for closed fracture: Secondary | ICD-10-CM | POA: Diagnosis not present

## 2019-12-11 ENCOUNTER — Other Ambulatory Visit (INDEPENDENT_AMBULATORY_CARE_PROVIDER_SITE_OTHER): Payer: 59

## 2019-12-11 ENCOUNTER — Other Ambulatory Visit: Payer: Self-pay

## 2019-12-11 DIAGNOSIS — I10 Essential (primary) hypertension: Secondary | ICD-10-CM | POA: Diagnosis not present

## 2019-12-11 LAB — BASIC METABOLIC PANEL
BUN: 23 mg/dL (ref 6–23)
CO2: 26 mEq/L (ref 19–32)
Calcium: 10 mg/dL (ref 8.4–10.5)
Chloride: 106 mEq/L (ref 96–112)
Creatinine, Ser: 0.78 mg/dL (ref 0.40–1.20)
GFR: 77.02 mL/min (ref >60.00)
Glucose, Bld: 99 mg/dL (ref 70–99)
Potassium: 3.9 mEq/L (ref 3.5–5.1)
Sodium: 139 mEq/L (ref 135–145)

## 2020-01-01 DIAGNOSIS — S92504D Nondisplaced unspecified fracture of right lesser toe(s), subsequent encounter for fracture with routine healing: Secondary | ICD-10-CM | POA: Diagnosis not present

## 2020-01-14 DIAGNOSIS — D1801 Hemangioma of skin and subcutaneous tissue: Secondary | ICD-10-CM | POA: Diagnosis not present

## 2020-01-14 DIAGNOSIS — L821 Other seborrheic keratosis: Secondary | ICD-10-CM | POA: Diagnosis not present

## 2020-01-14 DIAGNOSIS — D225 Melanocytic nevi of trunk: Secondary | ICD-10-CM | POA: Diagnosis not present

## 2020-01-14 DIAGNOSIS — D2261 Melanocytic nevi of right upper limb, including shoulder: Secondary | ICD-10-CM | POA: Diagnosis not present

## 2020-01-14 DIAGNOSIS — D2262 Melanocytic nevi of left upper limb, including shoulder: Secondary | ICD-10-CM | POA: Diagnosis not present

## 2020-01-14 DIAGNOSIS — L820 Inflamed seborrheic keratosis: Secondary | ICD-10-CM | POA: Diagnosis not present

## 2020-01-14 DIAGNOSIS — M5416 Radiculopathy, lumbar region: Secondary | ICD-10-CM | POA: Diagnosis not present

## 2020-01-14 DIAGNOSIS — L918 Other hypertrophic disorders of the skin: Secondary | ICD-10-CM | POA: Diagnosis not present

## 2020-01-28 ENCOUNTER — Ambulatory Visit (HOSPITAL_BASED_OUTPATIENT_CLINIC_OR_DEPARTMENT_OTHER): Payer: 59 | Admitting: Anesthesiology

## 2020-01-28 ENCOUNTER — Other Ambulatory Visit: Payer: Self-pay | Admitting: Anesthesiology

## 2020-01-28 ENCOUNTER — Other Ambulatory Visit: Payer: Self-pay

## 2020-01-28 ENCOUNTER — Encounter: Payer: Self-pay | Admitting: Anesthesiology

## 2020-01-28 ENCOUNTER — Ambulatory Visit
Admission: RE | Admit: 2020-01-28 | Discharge: 2020-01-28 | Disposition: A | Discharge status: Home / Self Care | Payer: 59 | Source: Ambulatory Visit | Attending: Anesthesiology | Admitting: Anesthesiology

## 2020-01-28 DIAGNOSIS — M47816 Spondylosis without myelopathy or radiculopathy, lumbar region: Secondary | ICD-10-CM | POA: Insufficient documentation

## 2020-01-28 DIAGNOSIS — R52 Pain, unspecified: Secondary | ICD-10-CM | POA: Diagnosis not present

## 2020-01-28 DIAGNOSIS — M5431 Sciatica, right side: Secondary | ICD-10-CM | POA: Insufficient documentation

## 2020-01-28 DIAGNOSIS — M5441 Lumbago with sciatica, right side: Secondary | ICD-10-CM

## 2020-01-28 DIAGNOSIS — Z9889 Other specified postprocedural states: Secondary | ICD-10-CM

## 2020-01-28 DIAGNOSIS — M5136 Other intervertebral disc degeneration, lumbar region: Secondary | ICD-10-CM

## 2020-01-28 DIAGNOSIS — G8929 Other chronic pain: Secondary | ICD-10-CM | POA: Diagnosis not present

## 2020-01-28 DIAGNOSIS — M545 Low back pain, unspecified: Secondary | ICD-10-CM

## 2020-01-28 DIAGNOSIS — M51369 Other intervertebral disc degeneration, lumbar region without mention of lumbar back pain or lower extremity pain: Secondary | ICD-10-CM | POA: Insufficient documentation

## 2020-01-28 HISTORY — DX: Other specified postprocedural states: Z98.890

## 2020-01-28 HISTORY — DX: Low back pain, unspecified: M54.50

## 2020-01-28 HISTORY — DX: Sciatica, right side: M54.31

## 2020-01-28 HISTORY — DX: Spondylosis without myelopathy or radiculopathy, lumbar region: M47.816

## 2020-01-28 MED ORDER — SODIUM CHLORIDE (PF) 0.9 % IJ SOLN
INTRAMUSCULAR | Status: AC
  Filled 2020-01-28: qty 10

## 2020-01-28 MED ORDER — ROPIVACAINE HCL 2 MG/ML IJ SOLN
INTRAMUSCULAR | Status: AC
  Filled 2020-01-28: qty 10

## 2020-01-28 MED ORDER — SODIUM CHLORIDE 0.9% FLUSH
10.0000 mL | Freq: Once | INTRAVENOUS | Status: AC
  Administered 2020-01-28: 10 mL

## 2020-01-28 MED ORDER — IOPAMIDOL (ISOVUE-M 200) INJECTION 41%
20.0000 mL | Freq: Once | INTRAMUSCULAR | Status: DC | PRN
  Administered 2020-01-28: 20 mL

## 2020-01-28 MED ORDER — TRIAMCINOLONE ACETONIDE 40 MG/ML IJ SUSP
INTRAMUSCULAR | Status: AC
  Filled 2020-01-28: qty 1

## 2020-01-28 MED ORDER — IOHEXOL 180 MG/ML  SOLN
INTRAMUSCULAR | Status: AC
  Filled 2020-01-28: qty 20

## 2020-01-28 MED ORDER — LIDOCAINE HCL (PF) 1 % IJ SOLN
5.0000 mL | Freq: Once | INTRAMUSCULAR | Status: AC
  Administered 2020-01-28: 5 mL via SUBCUTANEOUS

## 2020-01-28 MED ORDER — LIDOCAINE HCL (PF) 1 % IJ SOLN
INTRAMUSCULAR | Status: AC
  Filled 2020-01-28: qty 5

## 2020-01-28 MED ORDER — ROPIVACAINE HCL 2 MG/ML IJ SOLN
10.0000 mL | Freq: Once | INTRAMUSCULAR | Status: AC
  Administered 2020-01-28: 1 mL via EPIDURAL

## 2020-01-28 MED ORDER — TRIAMCINOLONE ACETONIDE 40 MG/ML IJ SUSP
40.0000 mg | Freq: Once | INTRAMUSCULAR | Status: AC
  Administered 2020-01-28: 40 mg

## 2020-01-28 NOTE — Progress Notes (Signed)
1345 Patient sat up from procedure table and stated she felt HOT. Became nauseated and states she was seeing spots. Placed patient in supine position and Dr. Andree Elk in for eval. Transferred to RR.   1355 Patient feeling much better. VS improved. Tolerating PO fluids well. Alert and oriented X3.  1415 Dr. Andree Elk in to see. Patient discharged.

## 2020-01-28 NOTE — Patient Instructions (Signed)
Pain Management Discharge Instructions  General Discharge Instructions :  If you need to reach your doctor call: Monday-Friday 8:00 am - 4:00 pm at [phone] or toll free [phone].  After clinic hours [phone] to have operator reach doctor.  Bring all of your medication bottles to all your appointments in the pain clinic.  To cancel or reschedule your appointment with Pain Management please remember to call 24 hours in advance to avoid a fee.  Refer to the educational materials which you have been given on: General Risks, I had my Procedure. Discharge Instructions, Post Sedation.  Post Procedure Instructions:  The drugs you were given will stay in your system until tomorrow, so for the next 24 hours you should not drive, make any legal decisions or drink any alcoholic beverages.  You may eat anything you prefer, but it is better to start with liquids then soups and crackers, and gradually work up to solid foods.  Please notify your doctor immediately if you have any unusual bleeding, trouble breathing or pain that is not related to your normal pain.  Depending on the type of procedure that was done, some parts of your body may feel week and/or numb.  This usually clears up by tonight or the next day.  Walk with the use of an assistive device or accompanied by an adult for the 24 hours.  You may use ice on the affected area for the first 24 hours.  Put ice in a Ziploc bag and cover with a towel and place against area 15 minutes on 15 minutes off.  You may switch to heat after 24 hours.Epidural Steroid Injection Patient Information  Description: The epidural space surrounds the nerves as they exit the spinal cord.  In some patients, the nerves can be compressed and inflamed by a bulging disc or a tight spinal canal (spinal stenosis).  By injecting steroids into the epidural space, we can bring irritated nerves into direct contact with a potentially helpful medication.  These  steroids act directly on the irritated nerves and can reduce swelling and inflammation which often leads to decreased pain.  Epidural steroids may be injected anywhere along the spine and from the neck to the low back depending upon the location of your pain.   After numbing the skin with local anesthetic (like Novocaine), a small needle is passed into the epidural space slowly.  You may experience a sensation of pressure while this is being done.  The entire block usually last less than 10 minutes.  Conditions which may be treated by epidural steroids:   Low back and leg pain  Neck and arm pain  Spinal stenosis  Post-laminectomy syndrome  Herpes zoster (shingles) pain  Pain from compression fractures  Preparation for the injection:  1. Do not eat any solid food or dairy products within 8 hours of your appointment.  2. You may drink clear liquids up to 3 hours before appointment.  Clear liquids include water, black coffee, juice or soda.  No milk or cream please. 3. You may take your regular medication, including pain medications, with a sip of water before your appointment  Diabetics should hold regular insulin (if taken separately) and take 1/2 normal NPH dos the morning of the procedure.  Carry some sugar containing items with you to your appointment. 4. A driver must accompany you and be prepared to drive you home after your procedure.  5. Bring all your current medications with your. 6. An IV may be inserted and   sedation may be given at the discretion of the physician.   7. A blood pressure cuff, EKG and other monitors will often be applied during the procedure.  Some patients may need to have extra oxygen administered for a short period. 8. You will be asked to provide medical information, including your allergies, prior to the procedure.  We must know immediately if you are taking blood thinners (like Coumadin/Warfarin)  Or if you are allergic to IV iodine contrast (dye). We must  know if you could possible be pregnant.  Possible side-effects:  Bleeding from needle site  Infection (rare, may require surgery)  Nerve injury (rare)  Numbness & tingling (temporary)  Difficulty urinating (rare, temporary)  Spinal headache ( a headache worse with upright posture)  Light -headedness (temporary)  Pain at injection site (several days)  Decreased blood pressure (temporary)  Weakness in arm/leg (temporary)  Pressure sensation in back/neck (temporary)  Call if you experience:  Fever/chills associated with headache or increased back/neck pain.  Headache worsened by an upright position.  New onset weakness or numbness of an extremity below the injection site  Hives or difficulty breathing (go to the emergency room)  Inflammation or drainage at the infection site  Severe back/neck pain  Any new symptoms which are concerning to you  Please note:  Although the local anesthetic injected can often make your back or neck feel good for several hours after the injection, the pain will likely return.  It takes 3-7 days for steroids to work in the epidural space.  You may not notice any pain relief for at least that one week.  If effective, we will often do a series of three injections spaced 3-6 weeks apart to maximally decrease your pain.  After the initial series, we generally will wait several months before considering a repeat injection of the same type.  If you have any questions, please call [phone] [facility] 

## 2020-01-28 NOTE — Progress Notes (Signed)
Safety precautions to be maintained throughout the outpatient stay will include: orient to surroundings, keep bed in low position, maintain call bell within reach at all times, provide assistance with transfer out of bed and ambulation.  

## 2020-01-28 NOTE — Progress Notes (Signed)
Subjective:  Patient ID: Lindsey Carson, female    DOB: [birth year]  Age: 54 y.o. MRN: [id number]  CC: Back Pain (low), Pain (left buttock), and Leg Pain (rith anterior thigh is numb)   Procedure: L4-5 epidural steroid under fluoroscopic guidance without sedation  HPI Lindsey Carson presents for a new patient evaluation.  She is a pleasant 54 year old white female with a longstanding history of low back pain.  She had a previous lumbar decompression at the L5-S1 level by Dr. Trenton Carson in [place].  She did reasonably well with control of her low back pain until January when she experienced some new symptoms of right anterior leg numbness upon awakening 1 morning.  She also was experiencing some low back pain.  She is gone through a series of oral steroids without any significant improvement in the pain she is describing is severe in nature.  Some of it has remitted in regards to the low back pain but she still having pain in the left lateral ankle right anterior thigh and lower back.  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  She reports today for an epidural steroid secondary to the unremitting sciatica-like symptoms.  She reports no previous epidural steroid injections.  The pain is mainly an aching gnawing pain that is unremitting and affecting her lifestyle and sleep.  Outpatient Medications Prior to Visit  Medication Sig Dispense Refill  . aspirin EC 81 MG tablet Take 1 tablet (81 mg total) by mouth daily. (Patient taking differently: Take 81 mg by mouth at bedtime. ) 90 tablet 3  . cetirizine (ZYRTEC ALLERGY) 10 MG tablet Take 10 mg by mouth at bedtime.     . Cholecalciferol (VITAMIN D) 2000 units CAPS Take by mouth at bedtime.    Marland Kitchen losartan (COZAAR) 50 MG tablet Take 1 tablet (50 mg total) by mouth daily. 30 tablet 2  . Magnesium 250 MG TABS Take 250 mg by mouth at bedtime.    . nortriptyline (PAMELOR) 10 MG  capsule Take 1 capsule (10 mg total) by mouth at bedtime. 90 capsule 1  . triamcinolone (NASACORT AQ) 55 MCG/ACT nasal inhaler Place 1 spray into the nose daily.     Marland Kitchen aspirin-acetaminophen-caffeine (EXCEDRIN MIGRAINE) 250-250-65 MG tablet Take 2 tablets by mouth daily as needed for headache.    . benzonatate (TESSALON PERLES) 100 MG capsule Take 1-2 capsules (100-200 mg total) by mouth every 8 (eight) hours as needed for cough. 30 capsule 0  . fluticasone (FLONASE) 50 MCG/ACT nasal spray Place 1 spray into both nostrils 2 (two) times daily. 16 g 6  . predniSONE (DELTASONE) 20 MG tablet Take 2 tablets (40 mg total) by mouth daily with breakfast. 10 tablet 0  . traMADol (ULTRAM) 50 MG tablet Take 1-2 tablets (50-100 mg total) by mouth every 6 (six) hours as needed. 15 tablet 0  . Vitamin D, Ergocalciferol, (DRISDOL) 50000 units CAPS capsule Take 50,000 Units by mouth every Monday.     No facility-administered medications prior to visit.    Review of Systems CNS: No confusion or sedation Cardiac: No angina or palpitations GI: No abdominal pain or constipation Constitutional: No nausea vomiting fevers or chills  Objective:  BP (!) 88/55   Pulse 100   Temp (!) 97 F (36.1 C) (Temporal)   Resp 16   Ht 5\' 3"  (1.6 m)   Wt 148 lb (67.1 kg)  SpO2 100%   BMI 26.22 kg/m    BP Readings from Last 3 Encounters:  01/28/20 (!) 88/55  04/06/19 120/80  09/04/18 138/80     Wt Readings from Last 3 Encounters:  01/28/20 148 lb (67.1 kg)  11/20/19 149 lb (67.6 kg)  04/06/19 141 lb (64 kg)     Physical Exam Pt is alert and oriented PERRL EOMI HEART IS RRR no murmur or rub LCTA no wheezing or rales MUSCULOSKELETAL reveals good muscle tone and bulk to the lower extremities with 5-/5 weakness to flexion at the right knee and intact right knee extension left knee extension.  She ambulates with a mildly antalgic gait.  She does have a positive straight leg raise right leg.  She does have pain  in the right lower back and a well-healed midline scar in the lumbar region.  She does have right lateral rotation pain greater than left.  Labs  Lab Results  Component Value Date   HGBA1C 5.4 06/27/2017   HGBA1C 5.5 12/29/2015   Lab Results  Component Value Date   LDLCALC 125 (H) 04/06/2019   CREATININE 0.78 12/11/2019    -------------------------------------------------------------------------------------------------------------------- Lab Results  Component Value Date   WBC 5.3 04/06/2019   HGB 14.3 04/06/2019   HCT 40.6 04/06/2019   PLT 158.0 04/06/2019   GLUCOSE 99 12/11/2019   CHOL 195 04/06/2019   TRIG 126.0 04/06/2019   HDL 45.10 04/06/2019   LDLDIRECT 99.0 12/29/2015   LDLCALC 125 (H) 04/06/2019   ALT 12 04/06/2019   AST 15 04/06/2019   NA 139 12/11/2019   K 3.9 12/11/2019   CL 106 12/11/2019   CREATININE 0.78 12/11/2019   BUN 23 12/11/2019   CO2 26 12/11/2019   TSH 3.75 04/06/2019   HGBA1C 5.4 06/27/2017    --------------------------------------------------------------------------------------------------------------------- DG PAIN CLINIC C-ARM 1-60 MIN NO REPORT  Result Date: 01/28/2020 Fluoro was used, but no Radiologist interpretation will be provided. Please refer to "NOTES" tab for provider progress note.    Assessment & Plan:   Lindsey Carson was seen today for back pain, pain and leg pain.  Diagnoses and all orders for this visit:  DDD (degenerative disc disease), lumbar -     Lumbar Epidural Injection; Future  Sciatica of right side -     Lumbar Epidural Injection; Future  Chronic bilateral low back pain with right-sided sciatica -     Lumbar Epidural Injection; Future  Previous back surgery  Facet arthropathy, lumbar  Other orders -     triamcinolone acetonide (KENALOG-40) injection 40 mg -     sodium chloride flush (NS) 0.9 % injection 10 mL -     ropivacaine (PF) 2 mg/mL (0.2%) (NAROPIN) injection 10 mL -     lidocaine (PF) (XYLOCAINE)  1 % injection 5 mL -     iopamidol (ISOVUE-M) 41 % intrathecal injection 20 mL        ----------------------------------------------------------------------------------------------------------------------  Problem List Items Addressed This Visit      Unprioritized   Chronic low back pain   Relevant Medications   triamcinolone acetonide (KENALOG-40) injection 40 mg   Other Relevant Orders   Lumbar Epidural Injection   DDD (degenerative disc disease), lumbar   Relevant Medications   triamcinolone acetonide (KENALOG-40) injection 40 mg   Other Relevant Orders   Lumbar Epidural Injection   Facet arthropathy, lumbar   Previous back surgery   Sciatica of right side   Relevant Orders   Lumbar Epidural Injection        ----------------------------------------------------------------------------------------------------------------------  1. DDD (degenerative disc disease), lumbar Based on the persistence and severity of her low back pain with sciatica symptoms I am going to proceed with a lumbar epidural steroid today.  Of gone over the risks and benefits of the procedure with her in full detail and all questions are answered.  I want her to continue with core stretching strengthening exercises with return to clinic approximately 3 weeks for reevaluation.  Depending on how she responds to today's injection at L4-5 interspace we may proceed for repeat injection or L3 4 injection. - Lumbar Epidural Injection; Future  2. Sciatica of right side As above - Lumbar Epidural Injection; Future  3. Chronic bilateral low back pain with right-sided sciatica As above - Lumbar Epidural Injection; Future  4. Previous back surgery As above and continue with core stretching strengthening exercises.  5. Facet arthropathy, lumbar     ----------------------------------------------------------------------------------------------------------------------  I am having Meeya D. Senteno maintain  her triamcinolone, cetirizine, aspirin EC, Vitamin D (Ergocalciferol), Magnesium, aspirin-acetaminophen-caffeine, Vitamin D, traMADol, benzonatate, fluticasone, predniSONE, nortriptyline, and losartan.   Meds ordered this encounter  Medications  . triamcinolone acetonide (KENALOG-40) injection 40 mg  . sodium chloride flush (NS) 0.9 % injection 10 mL  . ropivacaine (PF) 2 mg/mL (0.2%) (NAROPIN) injection 10 mL  . lidocaine (PF) (XYLOCAINE) 1 % injection 5 mL  . iopamidol (ISOVUE-M) 41 % intrathecal injection 20 mL   Patient's Medications  New Prescriptions   No medications on file  Previous Medications   ASPIRIN EC 81 MG TABLET    Take 1 tablet (81 mg total) by mouth daily.   ASPIRIN-ACETAMINOPHEN-CAFFEINE (EXCEDRIN MIGRAINE) 250-250-65 MG TABLET    Take 2 tablets by mouth daily as needed for headache.   BENZONATATE (TESSALON PERLES) 100 MG CAPSULE    Take 1-2 capsules (100-200 mg total) by mouth every 8 (eight) hours as needed for cough.   CETIRIZINE (ZYRTEC ALLERGY) 10 MG TABLET    Take 10 mg by mouth at bedtime.    CHOLECALCIFEROL (VITAMIN D) 2000 UNITS CAPS    Take by mouth at bedtime.   FLUTICASONE (FLONASE) 50 MCG/ACT NASAL SPRAY    Place 1 spray into both nostrils 2 (two) times daily.   LOSARTAN (COZAAR) 50 MG TABLET    Take 1 tablet (50 mg total) by mouth daily.   MAGNESIUM 250 MG TABS    Take 250 mg by mouth at bedtime.   NORTRIPTYLINE (PAMELOR) 10 MG CAPSULE    Take 1 capsule (10 mg total) by mouth at bedtime.   PREDNISONE (DELTASONE) 20 MG TABLET    Take 2 tablets (40 mg total) by mouth daily with breakfast.   TRAMADOL (ULTRAM) 50 MG TABLET    Take 1-2 tablets (50-100 mg total) by mouth every 6 (six) hours as needed.   TRIAMCINOLONE (NASACORT AQ) 55 MCG/ACT NASAL INHALER    Place 1 spray into the nose daily.    VITAMIN D, ERGOCALCIFEROL, (DRISDOL) 50000 UNITS CAPS CAPSULE    Take 50,000 Units by mouth every Monday.  Modified Medications   No medications on file  Discontinued  Medications   No medications on file   ----------------------------------------------------------------------------------------------------------------------  Follow-up: Return in about 3 weeks (around 02/18/2020) for evaluation, procedure.   Procedure: L4-5 LESI with fluoroscopic guidance and no moderate sedation  NOTE: The risks, benefits, and expectations of the procedure have been discussed and explained to the patient who was understanding and in agreement with suggested treatment plan. No guarantees were made.  DESCRIPTION OF PROCEDURE:  Lumbar epidural steroid injection with no IV Versed, EKG, blood pressure, pulse, and pulse oximetry monitoring. The procedure was performed with the patient in the prone position under fluoroscopic guidance.  Sterile prep x3 was initiated and I then injected subcutaneous lidocaine to the overlying L4-5 site after its fluoroscopic identifictation.  Using strict aseptic technique, I then advanced an 18-gauge Tuohy epidural needle in the midline using interlaminar approach via loss-of-resistance to saline technique. There was negative aspiration for heme or  CSF.  I then confirmed position with both AP and Lateral fluoroscan.  2 cc of contrast dye were injected and a  total of 5 mL of Preservative-Free normal saline mixed with 40 mg of Kenalog and 1cc Ropicaine 0.2 percent were injected incrementally via the  epidurally placed needle. The needle was removed. The patient tolerated the injection well and was convalesced and discharged to home in stable condition. Should the patient have any post procedure difficulty they have been instructed on how to contact us for assistance.    Molli Barrows, MD

## 2020-01-29 ENCOUNTER — Telehealth: Payer: Self-pay

## 2020-01-29 NOTE — Telephone Encounter (Signed)
Post procedure phone call. Patient states she is doing good.  

## 2020-02-24 ENCOUNTER — Other Ambulatory Visit: Payer: Self-pay | Admitting: Family Medicine

## 2020-02-24 DIAGNOSIS — I10 Essential (primary) hypertension: Secondary | ICD-10-CM

## 2020-02-29 DIAGNOSIS — E210 Primary hyperparathyroidism: Secondary | ICD-10-CM | POA: Diagnosis not present

## 2020-03-10 DIAGNOSIS — Z01419 Encounter for gynecological examination (general) (routine) without abnormal findings: Secondary | ICD-10-CM | POA: Diagnosis not present

## 2020-03-10 DIAGNOSIS — Z1231 Encounter for screening mammogram for malignant neoplasm of breast: Secondary | ICD-10-CM | POA: Diagnosis not present

## 2020-03-10 DIAGNOSIS — Z13820 Encounter for screening for osteoporosis: Secondary | ICD-10-CM | POA: Diagnosis not present

## 2020-03-10 DIAGNOSIS — Z6826 Body mass index (BMI) 26.0-26.9, adult: Secondary | ICD-10-CM | POA: Diagnosis not present

## 2020-05-01 ENCOUNTER — Telehealth: Payer: 59 | Admitting: Physician Assistant

## 2020-05-01 DIAGNOSIS — T63444A Toxic effect of venom of bees, undetermined, initial encounter: Secondary | ICD-10-CM

## 2020-05-01 NOTE — Progress Notes (Signed)
E Visit for Insect Sting  Thank you for describing the insect sting for [place].  Here is how we plan to help! Based on your description, it appears that you have  An uncomplicated insect sting that just occurred and can be closely followed using the instructions in your care plan.  The 2 greatest risks from insect stings are allergic reaction, which can be fatal in some people and infection, which is more common and less serious.  Bees, wasps, yellow jackets, and hornets belong to a class of insects called Hymenoptera.  Most insect stings cause only minor discomfort.  Stings can happen anywhere on the body and can be painful.  Most stings are from honey bees or yellow jackets.  Fire ants can sting multiple times.  The sites of the stings are more likely to become infected.    Based on your information I have:, Provided a home care guide for insect stings and instructions on when to call for help. and Because you are allergic to prednisone, the center for disease control recommends that we monitor you closely for the next two weeks.  No other antibiotic has been approved for prophylaxis.  What can be used to prevent Insect Stings?   Insect repellant with at least 20% DEET.    Wearing long pants and shirts with socks and shoes.    Wear dark or drab-colored clothes rather than bright colors.    Avoid using perfumes and hair sprays; these attract insects.  HOME CARE ADVICE:  1. Stinger removal:  The stinger looks like a tiny black dot in the sting.  Use a fingernail, credit card edge, or knife-edge to scrape it off.  Don't pull it out because it squeezes out more venom.  If the stinger is below the skin surface, leave it alone.  It will be shed with normal skin healing. 2. Use cold compresses to the area of the sting for 10-20 minutes.  You may repeat this as needed to relieve symptoms of pain and swelling. 3.  For pain relief, take acetominophen 650 mg 4-6 hours as needed or ibuprofen  400 mg every 6-8 hours as needed or naproxen 250-500 mg every 12 hours as needed. 4.  You can also use hydrocortisone cream 0.5% or 1% up to 4 times daily as needed for itching. 5.  If the sting becomes very itchy, take Benadryl 25-50 mg, follow directions on box. 6.  Wash the area 2-3 times daily with antibacterial soap and warm water. 7. Call your Doctor if:  Fever, a severe headache, or rash occur in the next 2 weeks.  Sting area begins to look infected.  Redness and swelling worsens after home treatment.  Your current symptoms become worse.    MAKE SURE YOU:   Understand these instructions.  Will watch your condition.  Will get help right away if you are not doing well or get worse.  Thank you for choosing an e-visit. Your e-visit answers were reviewed by a board certified advanced clinical practitioner to complete your personal care plan. Depending upon the condition, your plan could have included both over the counter or prescription medications. Please review your pharmacy choice. Be sure that the pharmacy you have chosen is open so that you can pick up your prescription now.  If there is a problem you may message your provider in [place] to have the prescription routed to another pharmacy. Your safety is important to [place]. If you have drug allergies check your prescription carefully.  For the next 24 hours, you can use MyChart to ask questions about today's visit, request a non-urgent call back, or ask for a work or school excuse from your e-visit provider. You will get an email in the next two days asking about your experience. I hope that your e-visit has been valuable and will speed your recovery.   Greater than 5 minutes, yet less than 10 minutes of time have been spent researching, coordinating and implementing care for this patient today.

## 2020-05-09 ENCOUNTER — Other Ambulatory Visit: Payer: Self-pay | Admitting: Family Medicine

## 2020-05-24 ENCOUNTER — Other Ambulatory Visit: Payer: Self-pay | Admitting: Family Medicine

## 2020-05-24 DIAGNOSIS — I10 Essential (primary) hypertension: Secondary | ICD-10-CM

## 2020-06-02 DIAGNOSIS — E213 Hyperparathyroidism, unspecified: Secondary | ICD-10-CM | POA: Diagnosis not present

## 2020-06-02 DIAGNOSIS — M549 Dorsalgia, unspecified: Secondary | ICD-10-CM | POA: Diagnosis not present

## 2020-06-02 DIAGNOSIS — G43909 Migraine, unspecified, not intractable, without status migrainosus: Secondary | ICD-10-CM | POA: Diagnosis not present

## 2020-06-02 DIAGNOSIS — I5189 Other ill-defined heart diseases: Secondary | ICD-10-CM | POA: Diagnosis not present

## 2020-06-02 DIAGNOSIS — I10 Essential (primary) hypertension: Secondary | ICD-10-CM | POA: Diagnosis not present

## 2020-06-02 DIAGNOSIS — E559 Vitamin D deficiency, unspecified: Secondary | ICD-10-CM | POA: Diagnosis not present

## 2020-06-02 DIAGNOSIS — M810 Age-related osteoporosis without current pathological fracture: Secondary | ICD-10-CM | POA: Diagnosis not present

## 2020-06-29 ENCOUNTER — Encounter: Payer: Self-pay | Admitting: Family Medicine

## 2020-06-30 ENCOUNTER — Other Ambulatory Visit: Payer: Self-pay | Admitting: Pharmacotherapy

## 2020-06-30 DIAGNOSIS — E559 Vitamin D deficiency, unspecified: Secondary | ICD-10-CM | POA: Diagnosis not present

## 2020-06-30 DIAGNOSIS — M810 Age-related osteoporosis without current pathological fracture: Secondary | ICD-10-CM | POA: Diagnosis not present

## 2020-06-30 DIAGNOSIS — I10 Essential (primary) hypertension: Secondary | ICD-10-CM | POA: Diagnosis not present

## 2020-07-14 MED FILL — TYMLOS 3120 MCG/1.56ML SOPN: 3120 | 30 days supply | Qty: 2 | Fill #0

## 2020-07-26 DIAGNOSIS — H1132 Conjunctival hemorrhage, left eye: Secondary | ICD-10-CM | POA: Diagnosis not present

## 2020-08-23 MED FILL — TYMLOS 3120 MCG/1.56ML SOPN: 3120 | 30 days supply | Qty: 2 | Fill #1

## 2020-09-21 DIAGNOSIS — K760 Fatty (change of) liver, not elsewhere classified: Secondary | ICD-10-CM | POA: Diagnosis not present

## 2020-09-21 DIAGNOSIS — I10 Essential (primary) hypertension: Secondary | ICD-10-CM | POA: Diagnosis not present

## 2020-09-21 DIAGNOSIS — E213 Hyperparathyroidism, unspecified: Secondary | ICD-10-CM | POA: Diagnosis not present

## 2020-09-21 DIAGNOSIS — I5189 Other ill-defined heart diseases: Secondary | ICD-10-CM | POA: Diagnosis not present

## 2020-09-21 DIAGNOSIS — E559 Vitamin D deficiency, unspecified: Secondary | ICD-10-CM | POA: Diagnosis not present

## 2020-09-21 DIAGNOSIS — M810 Age-related osteoporosis without current pathological fracture: Secondary | ICD-10-CM | POA: Diagnosis not present

## 2020-09-21 DIAGNOSIS — R000 Tachycardia, unspecified: Secondary | ICD-10-CM | POA: Diagnosis not present

## 2020-09-22 MED FILL — TYMLOS 3120 MCG/1.56ML SOPN: 3120 | 30 days supply | Qty: 2 | Fill #2

## 2020-10-13 DIAGNOSIS — E213 Hyperparathyroidism, unspecified: Secondary | ICD-10-CM | POA: Diagnosis not present

## 2020-10-26 MED FILL — TYMLOS 3120 MCG/1.56ML SOPN: 3120 | 30 days supply | Qty: 2 | Fill #3

## 2020-10-31 ENCOUNTER — Other Ambulatory Visit: Payer: Self-pay | Admitting: Family Medicine

## 2020-11-02 DIAGNOSIS — Z7689 Persons encountering health services in other specified circumstances: Secondary | ICD-10-CM | POA: Diagnosis not present

## 2020-11-11 ENCOUNTER — Encounter: Payer: Self-pay | Admitting: Family Medicine

## 2020-11-14 ENCOUNTER — Ambulatory Visit (HOSPITAL_BASED_OUTPATIENT_CLINIC_OR_DEPARTMENT_OTHER): Payer: 59 | Admitting: Pharmacist

## 2020-11-14 ENCOUNTER — Other Ambulatory Visit: Payer: Self-pay | Admitting: Internal Medicine

## 2020-11-14 ENCOUNTER — Telehealth: Payer: Self-pay | Admitting: Pharmacist

## 2020-11-14 ENCOUNTER — Other Ambulatory Visit: Payer: Self-pay

## 2020-11-14 DIAGNOSIS — Z79899 Other long term (current) drug therapy: Secondary | ICD-10-CM

## 2020-11-14 MED ORDER — TYMLOS 3120 MCG/1.56ML ~~LOC~~ SOPN: PEN_INJECTOR | SUBCUTANEOUS | 2 refills | Status: DC

## 2020-11-14 NOTE — Progress Notes (Signed)
   S: Patient presents today for review of their specialty medication.   Patient is currently taking Tymlos for osteoporosis. Patient is managed by Dr. Forde Dandy for this.   Dosing: 80 mcg subq once daily  Adherence: confirmed  Efficacy: pt reports this is working okay for her but is concerned about tachycardia that occurs after injecting  Monitoring:  - Pt endorses tachycardia that starts within 20 minutes of injecting Tymlos. This persists for several hours. He specialist is aware and is investigating this further.  - Orthostatic hypotension: none reported - Serum calcium: last level in Epic WNL - BMD: monitored by Dr. Forde Dandy    Current adverse effects: - No additional side effects reported other than tachycardia described above   O:     Lab Results  Component Value Date   WBC 5.3 04/06/2019   HGB 14.3 04/06/2019   HCT 40.6 04/06/2019   MCV 88.9 04/06/2019   PLT 158.0 04/06/2019      Chemistry      Component Value Date/Time   NA 139 12/11/2019 1024   K 3.9 12/11/2019 1024   CL 106 12/11/2019 1024   CO2 26 12/11/2019 1024   BUN 23 12/11/2019 1024   CREATININE 0.78 12/11/2019 1024      Component Value Date/Time   CALCIUM 10.0 12/11/2019 1024   ALKPHOS 107 04/06/2019 1401   AST 15 04/06/2019 1401   ALT 12 04/06/2019 1401   BILITOT 0.4 04/06/2019 1401       A/P: 1. Medication review: patient currently on Tymlos for osteoporosis. Reviewed the medication with the patient. She endorses tachycardia that occurs with Tymlos use. Pt reports that her specialist is investigating this. She does not reports any other side effects. She has no additional questions or concerns for me at this time. I recommend for her to follow-up with Dr. Forde Dandy regarding tachycardia. She will keep [place] updated if her treatment is changed.   Benard Halsted, PharmD, [facility], [place] [phone]

## 2020-11-14 NOTE — Telephone Encounter (Signed)
Called patient to schedule an appointment for the [facility]. I was unable to reach the patient so I left a HIPAA-compliant message requesting that the patient return my call.   

## 2020-11-29 MED FILL — TYMLOS 3120 MCG/1.56ML SOPN: 3120 | 30 days supply | Qty: 2 | Fill #0

## 2020-12-08 ENCOUNTER — Telehealth: Payer: 59 | Admitting: Physician Assistant

## 2020-12-08 ENCOUNTER — Other Ambulatory Visit: Payer: Self-pay | Admitting: Physician Assistant

## 2020-12-08 DIAGNOSIS — J019 Acute sinusitis, unspecified: Secondary | ICD-10-CM | POA: Diagnosis not present

## 2020-12-08 DIAGNOSIS — B9789 Other viral agents as the cause of diseases classified elsewhere: Secondary | ICD-10-CM | POA: Diagnosis not present

## 2020-12-08 MED ORDER — AZELASTINE HCL 0.1 % NA SOLN: 1.0000 | Freq: Two times a day (BID) | NASAL | 0 refills | Status: DC

## 2020-12-08 NOTE — Progress Notes (Signed)

## 2020-12-15 ENCOUNTER — Other Ambulatory Visit: Payer: Self-pay | Admitting: Physician Assistant

## 2020-12-15 ENCOUNTER — Telehealth: Payer: 59 | Admitting: Physician Assistant

## 2020-12-15 DIAGNOSIS — R059 Cough, unspecified: Secondary | ICD-10-CM

## 2020-12-15 DIAGNOSIS — J40 Bronchitis, not specified as acute or chronic: Secondary | ICD-10-CM

## 2020-12-15 MED ORDER — PREDNISONE 10 MG PO TABS: ORAL_TABLET | ORAL | 0 refills | Status: DC

## 2020-12-15 MED ORDER — AZITHROMYCIN 250 MG PO TABS
ORAL_TABLET | ORAL | 0 refills | Status: DC
Start: 2020-12-15 — End: 2020-12-15

## 2020-12-15 NOTE — Progress Notes (Signed)
We are sorry that you are not feeling well.  Here is how we plan to help!  Based on your presentation I believe you most likely have A cough due to bacteria.  When patients have a fever and a productive cough with a change in color or increased sputum production, we are concerned about bacterial bronchitis.  If left untreated it can progress to pneumonia.  If your symptoms do not improve with your treatment plan it is important that you contact your provider.   I have prescribed Azithromyin 250 mg: two tablets now and then one tablet daily for 4 additonal days    In addition you may use A non-prescription cough medication called Robitussin DAC. Take 2 teaspoons every 8 hours or Delsym: take 2 teaspoons every 12 hours. and A prescription cough medication called Tessalon Perles 100mg . You may take 1-2 capsules every 8 hours as needed for your cough.  Prednisone 10 mg daily for 6 days (see taper instructions below)  Directions for 6 day taper: Day 1: 2 tablets before breakfast, 1 after both lunch & dinner and 2 at bedtime Day 2: 1 tab before breakfast, 1 after both lunch & dinner and 2 at bedtime Day 3: 1 tab at each meal & 1 at bedtime Day 4: 1 tab at breakfast, 1 at lunch, 1 at bedtime Day 5: 1 tab at breakfast & 1 tab at bedtime Day 6: 1 tab at breakfast   From your responses in the eVisit questionnaire you describe inflammation in the upper respiratory tract which is causing a significant cough.  This is commonly called Bronchitis and has four common causes:    Allergies  Viral Infections  Acid Reflux  Bacterial Infection Allergies, viruses and acid reflux are treated by controlling symptoms or eliminating the cause. An example might be a cough caused by taking certain blood pressure medications. You stop the cough by changing the medication. Another example might be a cough caused by acid reflux. Controlling the reflux helps control the cough.  USE OF BRONCHODILATOR ("RESCUE")  INHALERS: There is a risk from using your bronchodilator too frequently.  The risk is that over-reliance on a medication which only relaxes the muscles surrounding the breathing tubes can reduce the effectiveness of medications prescribed to reduce swelling and congestion of the tubes themselves.  Although you feel brief relief from the bronchodilator inhaler, your asthma may actually be worsening with the tubes becoming more swollen and filled with mucus.  This can delay other crucial treatments, such as oral steroid medications. If you need to use a bronchodilator inhaler daily, several times per day, you should discuss this with your provider.  There are probably better treatments that could be used to keep your asthma under control.     HOME CARE . Only take medications as instructed by your medical team. . Complete the entire course of an antibiotic. . Drink plenty of fluids and get plenty of rest. . Avoid close contacts especially the very young and the elderly . Cover your mouth if you cough or cough into your sleeve. . Always remember to wash your hands . A steam or ultrasonic humidifier can help congestion.   GET HELP RIGHT AWAY IF: . You develop worsening fever. . You become short of breath . You cough up blood. . Your symptoms persist after you have completed your treatment plan MAKE SURE YOU   Understand these instructions.  Will watch your condition.  Will get help right away if you are  not doing well or get worse.  Your e-visit answers were reviewed by a board certified advanced clinical practitioner to complete your personal care plan.  Depending on the condition, your plan could have included both over the counter or prescription medications. If there is a problem please reply  once you have received a response from your provider. Your safety is important to [place].  If you have drug allergies check your prescription carefully.    You can use MyChart to ask questions about  today's visit, request a non-urgent call back, or ask for a work or school excuse for 24 hours related to this e-Visit. If it has been greater than 24 hours you will need to follow up with your provider, or enter a new e-Visit to address those concerns. You will get an e-mail in the next two days asking about your experience.  I hope that your e-visit has been valuable and will speed your recovery. Thank you for using e-visits.  Greater than 5 minutes, yet less than 10 minutes of time have been spent researching, coordinating and implementing care for this patient today.

## 2020-12-28 MED FILL — TYMLOS 3120 MCG/1.56ML SOPN: 3120 | 30 days supply | Qty: 2 | Fill #1

## 2021-01-09 DIAGNOSIS — D1801 Hemangioma of skin and subcutaneous tissue: Secondary | ICD-10-CM | POA: Diagnosis not present

## 2021-01-09 DIAGNOSIS — D2262 Melanocytic nevi of left upper limb, including shoulder: Secondary | ICD-10-CM | POA: Diagnosis not present

## 2021-01-09 DIAGNOSIS — D225 Melanocytic nevi of trunk: Secondary | ICD-10-CM | POA: Diagnosis not present

## 2021-01-09 DIAGNOSIS — L821 Other seborrheic keratosis: Secondary | ICD-10-CM | POA: Diagnosis not present

## 2021-01-09 DIAGNOSIS — D2271 Melanocytic nevi of right lower limb, including hip: Secondary | ICD-10-CM | POA: Diagnosis not present

## 2021-01-09 DIAGNOSIS — L918 Other hypertrophic disorders of the skin: Secondary | ICD-10-CM | POA: Diagnosis not present

## 2021-01-09 DIAGNOSIS — D2261 Melanocytic nevi of right upper limb, including shoulder: Secondary | ICD-10-CM | POA: Diagnosis not present

## 2021-01-09 DIAGNOSIS — L720 Epidermal cyst: Secondary | ICD-10-CM | POA: Diagnosis not present

## 2021-01-09 DIAGNOSIS — L232 Allergic contact dermatitis due to cosmetics: Secondary | ICD-10-CM | POA: Diagnosis not present

## 2021-01-26 MED FILL — TYMLOS 3120 MCG/1.56ML SOPN: 3120 | 30 days supply | Qty: 2 | Fill #2

## 2021-01-27 ENCOUNTER — Other Ambulatory Visit: Payer: Self-pay | Admitting: Endocrinology

## 2021-01-27 DIAGNOSIS — M810 Age-related osteoporosis without current pathological fracture: Secondary | ICD-10-CM | POA: Diagnosis not present

## 2021-01-27 DIAGNOSIS — E213 Hyperparathyroidism, unspecified: Secondary | ICD-10-CM | POA: Diagnosis not present

## 2021-01-27 DIAGNOSIS — K760 Fatty (change of) liver, not elsewhere classified: Secondary | ICD-10-CM | POA: Diagnosis not present

## 2021-01-27 DIAGNOSIS — E559 Vitamin D deficiency, unspecified: Secondary | ICD-10-CM | POA: Diagnosis not present

## 2021-01-27 DIAGNOSIS — R000 Tachycardia, unspecified: Secondary | ICD-10-CM | POA: Diagnosis not present

## 2021-01-27 DIAGNOSIS — I5189 Other ill-defined heart diseases: Secondary | ICD-10-CM | POA: Diagnosis not present

## 2021-01-27 DIAGNOSIS — I10 Essential (primary) hypertension: Secondary | ICD-10-CM | POA: Diagnosis not present

## 2021-01-27 DIAGNOSIS — M549 Dorsalgia, unspecified: Secondary | ICD-10-CM | POA: Diagnosis not present

## 2021-01-27 DIAGNOSIS — G43909 Migraine, unspecified, not intractable, without status migrainosus: Secondary | ICD-10-CM | POA: Diagnosis not present

## 2021-01-31 ENCOUNTER — Other Ambulatory Visit (HOSPITAL_COMMUNITY): Payer: Self-pay

## 2021-02-09 ENCOUNTER — Other Ambulatory Visit: Payer: Self-pay

## 2021-02-09 MED FILL — Nortriptyline HCl Cap 10 MG: ORAL | 90 days supply | Qty: 90 | Fill #0 | Status: AC

## 2021-02-20 ENCOUNTER — Other Ambulatory Visit (HOSPITAL_COMMUNITY): Payer: Self-pay

## 2021-02-23 ENCOUNTER — Other Ambulatory Visit: Payer: Self-pay | Admitting: Pharmacotherapy

## 2021-02-23 ENCOUNTER — Other Ambulatory Visit (HOSPITAL_COMMUNITY): Payer: Self-pay

## 2021-02-24 ENCOUNTER — Other Ambulatory Visit: Payer: Self-pay | Admitting: Pharmacist

## 2021-02-24 ENCOUNTER — Other Ambulatory Visit (HOSPITAL_COMMUNITY): Payer: Self-pay

## 2021-02-24 MED ORDER — TYMLOS 3120 MCG/1.56ML ~~LOC~~ SOPN: PEN_INJECTOR | SUBCUTANEOUS | 6 refills | Status: DC

## 2021-02-24 MED ORDER — TYMLOS 3120 MCG/1.56ML ~~LOC~~ SOPN: PEN_INJECTOR | SUBCUTANEOUS | 5 refills | Status: DC

## 2021-02-24 MED ORDER — TYMLOS 3120 MCG/1.56ML ~~LOC~~ SOPN
PEN_INJECTOR | SUBCUTANEOUS | 5 refills | Status: DC
  Filled 2021-02-24: qty 1.56, 28d supply, fill #0
  Filled 2021-03-28: qty 1.56, 28d supply, fill #1
  Filled 2021-05-01: qty 1.56, 28d supply, fill #2
  Filled 2021-05-26: qty 1.56, 28d supply, fill #3
  Filled 2021-07-04: qty 1.56, 28d supply, fill #4
  Filled 2021-08-02: qty 1.56, 28d supply, fill #5

## 2021-02-28 ENCOUNTER — Other Ambulatory Visit (HOSPITAL_COMMUNITY): Payer: Self-pay

## 2021-03-01 ENCOUNTER — Other Ambulatory Visit (HOSPITAL_COMMUNITY): Payer: Self-pay

## 2021-03-22 ENCOUNTER — Other Ambulatory Visit (HOSPITAL_COMMUNITY): Payer: Self-pay

## 2021-03-24 ENCOUNTER — Other Ambulatory Visit (HOSPITAL_COMMUNITY): Payer: Self-pay

## 2021-03-28 ENCOUNTER — Other Ambulatory Visit (HOSPITAL_COMMUNITY): Payer: Self-pay

## 2021-03-28 DIAGNOSIS — Z6829 Body mass index (BMI) 29.0-29.9, adult: Secondary | ICD-10-CM | POA: Diagnosis not present

## 2021-03-28 DIAGNOSIS — Z1231 Encounter for screening mammogram for malignant neoplasm of breast: Secondary | ICD-10-CM | POA: Diagnosis not present

## 2021-03-28 DIAGNOSIS — Z01419 Encounter for gynecological examination (general) (routine) without abnormal findings: Secondary | ICD-10-CM | POA: Diagnosis not present

## 2021-03-30 DIAGNOSIS — L820 Inflamed seborrheic keratosis: Secondary | ICD-10-CM | POA: Diagnosis not present

## 2021-04-25 ENCOUNTER — Other Ambulatory Visit (HOSPITAL_COMMUNITY): Payer: Self-pay

## 2021-04-27 ENCOUNTER — Other Ambulatory Visit (HOSPITAL_COMMUNITY): Payer: Self-pay

## 2021-04-30 ENCOUNTER — Other Ambulatory Visit: Payer: Self-pay | Admitting: Family Medicine

## 2021-04-30 ENCOUNTER — Other Ambulatory Visit: Payer: Self-pay | Admitting: Pharmacotherapy

## 2021-04-30 MED FILL — Metoprolol Succinate Tab ER 24HR 25 MG (Tartrate Equiv): ORAL | 30 days supply | Qty: 30 | Fill #0 | Status: AC

## 2021-05-01 ENCOUNTER — Other Ambulatory Visit (HOSPITAL_COMMUNITY): Payer: Self-pay

## 2021-05-01 ENCOUNTER — Other Ambulatory Visit: Payer: Self-pay

## 2021-05-01 MED ORDER — INSULIN PEN NEEDLE 32G X 4 MM MISC
11 refills | Status: DC
  Filled 2021-05-01: qty 100, 90d supply, fill #0
  Filled 2021-09-12: qty 100, 90d supply, fill #1
  Filled 2022-01-15: qty 100, 90d supply, fill #2

## 2021-05-02 ENCOUNTER — Other Ambulatory Visit: Payer: Self-pay

## 2021-05-04 ENCOUNTER — Other Ambulatory Visit: Payer: Self-pay

## 2021-05-04 DIAGNOSIS — T161XXA Foreign body in right ear, initial encounter: Secondary | ICD-10-CM | POA: Diagnosis not present

## 2021-05-04 DIAGNOSIS — H9201 Otalgia, right ear: Secondary | ICD-10-CM | POA: Diagnosis not present

## 2021-05-04 MED ORDER — CIPROFLOXACIN-DEXAMETHASONE 0.3-0.1 % OT SUSP
OTIC | 0 refills | Status: DC
  Filled 2021-05-04: qty 7.5, 14d supply, fill #0

## 2021-05-05 ENCOUNTER — Other Ambulatory Visit (HOSPITAL_COMMUNITY): Payer: Self-pay

## 2021-05-09 ENCOUNTER — Other Ambulatory Visit (HOSPITAL_COMMUNITY): Payer: Self-pay

## 2021-05-23 ENCOUNTER — Ambulatory Visit: Payer: 59 | Admitting: Family Medicine

## 2021-05-23 ENCOUNTER — Encounter: Payer: Self-pay | Admitting: Family Medicine

## 2021-05-23 ENCOUNTER — Other Ambulatory Visit: Payer: Self-pay

## 2021-05-23 DIAGNOSIS — G44209 Tension-type headache, unspecified, not intractable: Secondary | ICD-10-CM | POA: Diagnosis not present

## 2021-05-23 DIAGNOSIS — M810 Age-related osteoporosis without current pathological fracture: Secondary | ICD-10-CM | POA: Insufficient documentation

## 2021-05-23 DIAGNOSIS — I10 Essential (primary) hypertension: Secondary | ICD-10-CM

## 2021-05-23 MED ORDER — NORTRIPTYLINE HCL 10 MG PO CAPS
ORAL_CAPSULE | Freq: Every day | ORAL | 1 refills | Status: DC
  Filled 2021-05-23: qty 90, 90d supply, fill #0
  Filled 2021-08-17: qty 90, 90d supply, fill #1

## 2021-05-23 MED FILL — Metoprolol Succinate Tab ER 24HR 25 MG (Tartrate Equiv): ORAL | 30 days supply | Qty: 30 | Fill #1 | Status: AC

## 2021-05-23 NOTE — Assessment & Plan Note (Signed)
Stable.  I will refill her nortriptyline.  She will monitor for drowsiness.

## 2021-05-23 NOTE — Patient Instructions (Signed)
Nice to see you. I refilled your nortriptyline. Please consider getting the shingles vaccine. Please discussed the side effects from the [facility] with the endocrinologist.

## 2021-05-23 NOTE — Progress Notes (Signed)
Tommi Rumps, MD Phone: [phone]  Lindsey Carson is a 55 y.o. female who presents today for follow-up.  Hypertension: Generally stable.  Lindsey Carson is now on metoprolol.  No longer taking losartan.  No chest pain or persistent edema.  Lindsey Carson notes her endocrinologist switched her over to metoprolol as her heart rate was bumping up with the tymlos.  Lindsey Carson notes when her heart rate goes up Lindsey Carson feels a little short of breath though the symptoms all occur right after Lindsey Carson takes the injection.  Tension headaches: Patient notes nortriptyline has been very helpful.  Lindsey Carson rarely gets these.  Lindsey Carson does not wake up with headaches.  No numbness, weakness, or vision changes.  No drowsiness with the nortriptyline.  Social History   Tobacco Use  Smoking Status Never  Smokeless Tobacco Never    Current Outpatient Medications on File Prior to Visit  Medication Sig Dispense Refill   Abaloparatide (TYMLOS) 3120 MCG/1.56ML SOPN INJECT 80 MCG SUBCUTANEOUS ONCE DAILY 30 DAYS. 1.56 mL 5   aspirin EC 81 MG tablet Take 1 tablet (81 mg total) by mouth daily. (Patient taking differently: Take 81 mg by mouth at bedtime.) 90 tablet 3   cetirizine (ZYRTEC) 10 MG tablet Take 10 mg by mouth at bedtime.      Cholecalciferol (VITAMIN D) 2000 units CAPS Take by mouth at bedtime.     Insulin Pen Needle 32G X 4 MM MISC use as directed 100 each 11   Magnesium 250 MG TABS Take 250 mg by mouth at bedtime.     metoprolol succinate (TOPROL-XL) 25 MG 24 hr tablet TAKE 1 TABLET BY MOUTH ONCE A DAY 30 tablet 6   triamcinolone (NASACORT) 55 MCG/ACT nasal inhaler Place 1 spray into the nose daily.      No current facility-administered medications on file prior to visit.     ROS see history of present illness  Objective  Physical Exam Vitals:   05/23/21 1146  BP: 125/79  Pulse: 79  Temp: 98.4 F (36.9 C)  SpO2: 97%    BP Readings from Last 3 Encounters:  05/23/21 125/79  01/28/20 105/60  04/06/19 120/80   Wt Readings  from Last 3 Encounters:  05/23/21 169 lb (76.7 kg)  01/28/20 148 lb (67.1 kg)  11/20/19 149 lb (67.6 kg)    Physical Exam Constitutional:      General: Lindsey Carson is not in acute distress.    Appearance: Lindsey Carson is not diaphoretic.  Cardiovascular:     Rate and Rhythm: Normal rate and regular rhythm.     Heart sounds: Normal heart sounds.  Pulmonary:     Effort: Pulmonary effort is normal.     Breath sounds: Normal breath sounds.  Skin:    General: Skin is warm and dry.  Neurological:     Mental Status: Lindsey Carson is alert.     Assessment/Plan: Please see individual problem list.  Problem List Items Addressed This Visit     Hypertension    Adequately controlled.  Lindsey Carson will continue metoprolol 25 mg daily.       Osteoporosis    I encouraged her to discuss the side effects of the tymlos with her endocrinologist to see if there is an alternative treatment.       Tension headache    Stable.  I will refill her nortriptyline.  Lindsey Carson will monitor for drowsiness.       Relevant Medications   nortriptyline (PAMELOR) 10 MG capsule     Health Maintenance: We will  request records from her endocrinologist as well as her gynecologist.  Lindsey Carson reports Pap smear, DEXA scan, and mammogram through her GYN.  Return in about 6 months (around 11/23/2021) for Tension headaches, hypertension.  This visit occurred during the SARS-CoV-2 public health emergency 8 safety protocols were in place, including screening questions prior to the fall, additional usage of staff PPE, and extensive cleaning of exam room while observing appropriate contact time as indicated for disinfecting solutions.    Tommi Rumps, MD [place]

## 2021-05-23 NOTE — Assessment & Plan Note (Signed)
I encouraged her to discuss the side effects of the tymlos with her endocrinologist to see if there is an alternative treatment.

## 2021-05-23 NOTE — Assessment & Plan Note (Signed)
Adequately controlled.  She will continue metoprolol 25 mg daily.

## 2021-05-24 ENCOUNTER — Other Ambulatory Visit (HOSPITAL_COMMUNITY): Payer: Self-pay

## 2021-05-25 ENCOUNTER — Other Ambulatory Visit: Payer: Self-pay

## 2021-05-26 ENCOUNTER — Other Ambulatory Visit (HOSPITAL_COMMUNITY): Payer: Self-pay

## 2021-05-30 ENCOUNTER — Encounter: Payer: Self-pay | Admitting: Family Medicine

## 2021-05-31 NOTE — Telephone Encounter (Signed)
Can we get her set up for a visit to discuss this? I will need more details to determine appropriate treatment for her anxiety.

## 2021-06-01 NOTE — Telephone Encounter (Signed)
Placed call to pt to schedule a visit. Left message to call back.

## 2021-06-05 DIAGNOSIS — N760 Acute vaginitis: Secondary | ICD-10-CM | POA: Diagnosis not present

## 2021-06-05 DIAGNOSIS — Z113 Encounter for screening for infections with a predominantly sexual mode of transmission: Secondary | ICD-10-CM | POA: Diagnosis not present

## 2021-06-08 ENCOUNTER — Other Ambulatory Visit (HOSPITAL_COMMUNITY): Payer: Self-pay

## 2021-06-09 ENCOUNTER — Other Ambulatory Visit: Payer: Self-pay

## 2021-06-09 ENCOUNTER — Telehealth: Payer: 59 | Admitting: Family Medicine

## 2021-06-09 DIAGNOSIS — E559 Vitamin D deficiency, unspecified: Secondary | ICD-10-CM | POA: Diagnosis not present

## 2021-06-09 DIAGNOSIS — R946 Abnormal results of thyroid function studies: Secondary | ICD-10-CM | POA: Diagnosis not present

## 2021-06-09 DIAGNOSIS — R000 Tachycardia, unspecified: Secondary | ICD-10-CM | POA: Diagnosis not present

## 2021-06-09 DIAGNOSIS — I10 Essential (primary) hypertension: Secondary | ICD-10-CM | POA: Diagnosis not present

## 2021-06-09 DIAGNOSIS — F419 Anxiety disorder, unspecified: Secondary | ICD-10-CM | POA: Diagnosis not present

## 2021-06-09 DIAGNOSIS — M810 Age-related osteoporosis without current pathological fracture: Secondary | ICD-10-CM | POA: Diagnosis not present

## 2021-06-09 DIAGNOSIS — G43909 Migraine, unspecified, not intractable, without status migrainosus: Secondary | ICD-10-CM | POA: Diagnosis not present

## 2021-06-09 DIAGNOSIS — F32A Depression, unspecified: Secondary | ICD-10-CM | POA: Insufficient documentation

## 2021-06-09 DIAGNOSIS — I5189 Other ill-defined heart diseases: Secondary | ICD-10-CM | POA: Diagnosis not present

## 2021-06-09 DIAGNOSIS — K760 Fatty (change of) liver, not elsewhere classified: Secondary | ICD-10-CM | POA: Diagnosis not present

## 2021-06-09 DIAGNOSIS — E213 Hyperparathyroidism, unspecified: Secondary | ICD-10-CM | POA: Diagnosis not present

## 2021-06-09 MED ORDER — CLONAZEPAM 0.5 MG PO TABS
0.2500 mg | ORAL_TABLET | Freq: Two times a day (BID) | ORAL | 0 refills | Status: DC | PRN
  Filled 2021-06-09: qty 10, 10d supply, fill #0

## 2021-06-09 NOTE — Progress Notes (Signed)
Virtual Visit via video Note  This visit type was conducted due to national recommendations for restrictions regarding the COVID-19 pandemic (e.g. social distancing).  This format is felt to be most appropriate for this patient at this time.  All issues noted in this document were discussed and addressed.  No physical exam was performed (except for noted visual exam findings with Video Visits).   I connected with Lindsey Carson today at 11:30 AM EDT by a video enabled telemedicine application or telephone and verified that I am speaking with the correct person using two identifiers. Location patient: home Location provider: work  Persons participating in the virtual visit: patient, provider  I discussed the limitations, risks, security and privacy concerns of performing an evaluation and management service by telephone and the availability of in person appointments. I also discussed with the patient that there may be a patient responsible charge related to this service. The patient expressed understanding and agreed to proceed.  Reason for visit: same day visit  HPI: Anxiety: Patient notes onset of anxiety over the last week or so.  She found out her husband is cheating on her.  She notes he has had some other issues with alcoholism and it has all been spiraling.  She notes difficulty sleeping related to thinking about this.  She notes no depression though does note when she thinks about it she gets upset about things.  She notes no SI or HI.  She just wants something to help with the acute anxiety at this time.  She also notes she is going to try to talk to a therapist about this.   ROS: See pertinent positives and negatives per HPI.  Past Medical History:  Diagnosis Date   Allergy    Arthritis    OA   Chronic allergic rhinitis    Severe, year long   Chronic back pain    HNP - history   Chronic low back pain 01/28/2020   Elevated blood pressure    History of   Facet arthropathy, lumbar  01/28/2020   Headache    Heart murmur    was told but doesn't require meds - never has been a problem   Hemorrhoids    History OF   Hypertension    OFF BP MEDS FOR 6 MONTHS   Joint pain    hands and knees, hips and shoulders   PONV (postoperative nausea and vomiting)    Previous back surgery 01/28/2020   Sciatica of right side 01/28/2020   Seasonal allergies    uses nasocort daily and zyrtec   Sinus infection    History of frequent   Spinal fracture YRS AGO   L5/S1    Past Surgical History:  Procedure Laterality Date   CESAREAN SECTION  1994 and 1998   x 2    COLONSCOPY  08/2017   DILATION AND CURETTAGE OF UTERUS  08/22/2017   LUMBAR LAMINECTOMY/DECOMPRESSION MICRODISCECTOMY  09/08/2012   Procedure: LUMBAR LAMINECTOMY/DECOMPRESSION MICRODISCECTOMY 1 LEVEL;  Surgeon: Charlie Pitter, MD;  Location: [facility];  Service: Neurosurgery;  Laterality: Left;  left Lumbar Five-Sacral one laminectomy/microdiscectomy   PARATHYROIDECTOMY Left 09/04/2018   Procedure: LEFT INFERIOR PARATHYROIDECTOMY WITH  NECK EXPLORATION;  Surgeon: Armandina Gemma, MD;  Location: [facility];  Service: General;  Laterality: Left;   WISDOM TOOTH EXTRACTION  1984    Family History  Problem Relation Age of Onset   Diabetes Mother    Hypertension Mother    Hypothyroidism Mother  Heart attack Father        MI   Coronary artery disease Father    Diabetes Father    Hemachromatosis Father    Diabetes Brother    Hemachromatosis Brother    Colon cancer Maternal Uncle    Colon cancer Maternal Grandfather     SOCIAL HX: nonsmoker   Current Outpatient Medications:    Abaloparatide (TYMLOS) 3120 MCG/1.56ML SOPN, INJECT 1 PEN (80 MCG) UNDER THE SKIN ONCE DAILY AS DIRECTED, Disp: 1.56 mL, Rfl: 5   aspirin EC 81 MG tablet, Take 1 tablet (81 mg total) by mouth daily. (Patient taking differently: Take 81 mg by mouth at bedtime.), Disp: 90 tablet, Rfl: 3   cetirizine (ZYRTEC) 10 MG tablet, Take 10 mg by mouth at  bedtime. , Disp: , Rfl:    Cholecalciferol (VITAMIN D) 2000 units CAPS, Take by mouth at bedtime., Disp: , Rfl:    clonazePAM (KLONOPIN) 0.5 MG tablet, Take 0.5 tablets (0.25 mg total) by mouth 2 (two) times daily as needed for anxiety., Disp: 10 tablet, Rfl: 0   Insulin Pen Needle 32G X 4 MM MISC, use as directed, Disp: 100 each, Rfl: 11   Magnesium 250 MG TABS, Take 250 mg by mouth at bedtime., Disp: , Rfl:    metoprolol succinate (TOPROL-XL) 25 MG 24 hr tablet, TAKE 1 TABLET BY MOUTH ONCE A DAY, Disp: 30 tablet, Rfl: 6   nortriptyline (PAMELOR) 10 MG capsule, TAKE 1 CAPSULE BY MOUTH AT BEDTIME., Disp: 90 capsule, Rfl: 1   triamcinolone (NASACORT) 55 MCG/ACT nasal inhaler, Place 1 spray into the nose daily. , Disp: , Rfl:   EXAM:  VITALS per patient if applicable:  GENERAL: alert, oriented, appears well and in no acute distress  LUNGS: on inspection no signs of respiratory distress, breathing rate appears normal, no obvious gross SOB, gasping or wheezing  CV: no obvious cyanosis  PSYCH/NEURO: pleasant and cooperative, affect flat, speech and thought processing grossly intact  ASSESSMENT AND PLAN:  Discussed the following assessment and plan:  Problem List Items Addressed This Visit     Anxiety    New onset anxiety due to some life stressors.  Discussed treatment options including acute treatment with benzodiazepines for short period of time versus treatment with daily medication such as an SSRI or her nortriptyline.  She opted for short-term treatment with a benzodiazepine.  She notes she is not drinking any alcohol.  I discussed the risk of drowsiness as well as addiction and dependence on benzodiazepines.  We will start with clonazepam 0.25 mg twice daily as needed.  We will follow-up in 1 month.       Relevant Medications   clonazePAM (KLONOPIN) 0.5 MG tablet    Return in about 1 month (around 07/10/2021) for anxiety.   I discussed the assessment and treatment plan with the  patient. The patient was provided an opportunity to ask questions and all were answered. The patient agreed with the plan and demonstrated an understanding of the instructions.   The patient was advised to call back or seek an in-person evaluation if the symptoms worsen or if the condition fails to improve as anticipated.    Tommi Rumps, MD

## 2021-06-09 NOTE — Assessment & Plan Note (Addendum)
New onset anxiety due to some life stressors.  Discussed treatment options including acute treatment with benzodiazepines for short period of time versus treatment with daily medication such as an SSRI or her nortriptyline.  She opted for short-term treatment with a benzodiazepine.  She notes she is not drinking any alcohol.  I discussed the risk of drowsiness as well as addiction and dependence on benzodiazepines.  She was advised not to drive while taking this.  We will start with clonazepam 0.25 mg twice daily as needed.  We will follow-up in 1 month.

## 2021-06-29 ENCOUNTER — Other Ambulatory Visit: Payer: Self-pay

## 2021-06-29 MED FILL — Metoprolol Succinate Tab ER 24HR 25 MG (Tartrate Equiv): ORAL | 30 days supply | Qty: 30 | Fill #2 | Status: AC

## 2021-07-04 ENCOUNTER — Other Ambulatory Visit (HOSPITAL_COMMUNITY): Payer: Self-pay

## 2021-07-05 ENCOUNTER — Other Ambulatory Visit (HOSPITAL_COMMUNITY): Payer: Self-pay

## 2021-07-15 ENCOUNTER — Telehealth: Payer: 59 | Admitting: Nurse Practitioner

## 2021-07-15 DIAGNOSIS — J069 Acute upper respiratory infection, unspecified: Secondary | ICD-10-CM | POA: Diagnosis not present

## 2021-07-15 MED ORDER — FLUTICASONE PROPIONATE 50 MCG/ACT NA SUSP: 2.0000 | Freq: Every day | NASAL | 6 refills | Status: DC

## 2021-07-15 MED ORDER — BENZONATATE 100 MG PO CAPS: 100.0000 mg | ORAL_CAPSULE | Freq: Three times a day (TID) | ORAL | 0 refills | Status: DC | PRN

## 2021-07-15 NOTE — Progress Notes (Signed)
E-Visit for Upper Respiratory Infection   We are sorry you are not feeling well.  Here is how we plan to help!  The symptoms you are describing are the first signs of covid. I recommend you do a covid test. If it comes back positive and you want to be treated with the new antiviral medication, you will need to do a virtual urgent care visit. Antivirals cannot be prescribed in an evisit.  Based on what you have shared with me, it looks like you may have a viral upper respiratory infection.  Upper respiratory infections are caused by a large number of viruses; however, rhinovirus is the most common cause.   Symptoms vary from person to person, with common symptoms including sore throat, cough, fatigue or lack of energy and feeling of general discomfort.  A low-grade fever of up to 100.4 may present, but is often uncommon.  Symptoms vary however, and are closely related to a person's age or underlying illnesses.  The most common symptoms associated with an upper respiratory infection are nasal discharge or congestion, cough, sneezing, headache and pressure in the ears and face.  These symptoms usually persist for about 3 to 10 days, but can last up to 2 weeks.  It is important to know that upper respiratory infections do not cause serious illness or complications in most cases.    Upper respiratory infections can be transmitted from person to person, with the most common method of transmission being a person's hands.  The virus is able to live on the skin and can infect other persons for up to 2 hours after direct contact.  Also, these can be transmitted when someone coughs or sneezes; thus, it is important to cover the mouth to reduce this risk.  To keep the spread of the illness at [place], good hand hygiene is very important.  This is an infection that is most likely caused by a virus. There are no specific treatments other than to help you with the symptoms until the infection runs its course.  We are sorry  you are not feeling well.  Here is how we plan to help!   For nasal congestion, you may use an oral decongestants such as Mucinex D or if you have glaucoma or high blood pressure use plain Mucinex.  Saline nasal spray or nasal drops can help and can safely be used as often as needed for congestion.  For your congestion, I have prescribed Fluticasone nasal spray one spray in each nostril twice a day  If you do not have a history of heart disease, hypertension, diabetes or thyroid disease, prostate/bladder issues or glaucoma, you may also use Sudafed to treat nasal congestion.  It is highly recommended that you consult with a pharmacist or your primary care physician to ensure this medication is safe for you to take.     If you have a cough, you may use cough suppressants such as Delsym and Robitussin.  If you have glaucoma or high blood pressure, you can also use Coricidin HBP.   For cough I have prescribed for you A prescription cough medication called Tessalon Perles 100 mg. You may take 1-2 capsules every 8 hours as needed for cough  If you have a sore or scratchy throat, use a saltwater gargle-  to  teaspoon of salt dissolved in a 4-ounce to 8-ounce glass of warm water.  Gargle the solution for approximately 15-30 seconds and then spit.  It is important not to swallow the  solution.  You can also use throat lozenges/cough drops and Chloraseptic spray to help with throat pain or discomfort.  Warm or cold liquids can also be helpful in relieving throat pain.  For headache, pain or general discomfort, you can use Ibuprofen or Tylenol as directed.   Some authorities believe that zinc sprays or the use of Echinacea may shorten the course of your symptoms.   HOME CARE Only take medications as instructed by your medical team. Be sure to drink plenty of fluids. Water is fine as well as fruit juices, sodas and electrolyte beverages. You may want to stay away from caffeine or alcohol. If you are  nauseated, try taking small sips of liquids. How do you know if you are getting enough fluid? Your urine should be a pale yellow or almost colorless. Get rest. Taking a steamy shower or using a humidifier may help nasal congestion and ease sore throat pain. You can place a towel over your head and breathe in the steam from hot water coming from a faucet. Using a saline nasal spray works much the same way. Cough drops, hard candies and sore throat lozenges may ease your cough. Avoid close contacts especially the very young and the elderly Cover your mouth if you cough or sneeze Always remember to wash your hands.   GET HELP RIGHT AWAY IF: You develop worsening fever. If your symptoms do not improve within 10 days You develop yellow or green discharge from your nose over 3 days. You have coughing fits You develop a severe head ache or visual changes. You develop shortness of breath, difficulty breathing or start having chest pain Your symptoms persist after you have completed your treatment plan  MAKE SURE YOU  Understand these instructions. Will watch your condition. Will get help right away if you are not doing well or get worse.  Thank you for choosing an e-visit.  Your e-visit answers were reviewed by a board certified advanced clinical practitioner to complete your personal care plan. Depending upon the condition, your plan could have included both over the counter or prescription medications.  Please review your pharmacy choice. Make sure the pharmacy is open so you can pick up prescription now. If there is a problem, you may contact your provider through [employer] and have the prescription routed to another pharmacy.  Your safety is important to [place]. If you have drug allergies check your prescription carefully.   For the next 24 hours you can use MyChart to ask questions about today's visit, request a non-urgent call back, or ask for a work or school excuse. You will get an  email in the next two days asking about your experience. I hope that your e-visit has been valuable and will speed your recovery.  5-10 minutes spent reviewing and documenting in chart.

## 2021-07-27 ENCOUNTER — Encounter: Payer: Self-pay | Admitting: Family Medicine

## 2021-07-31 MED FILL — Metoprolol Succinate Tab ER 24HR 25 MG (Tartrate Equiv): ORAL | 30 days supply | Qty: 30 | Fill #3 | Status: AC

## 2021-08-01 ENCOUNTER — Other Ambulatory Visit: Payer: Self-pay

## 2021-08-01 ENCOUNTER — Other Ambulatory Visit (HOSPITAL_COMMUNITY): Payer: Self-pay

## 2021-08-02 ENCOUNTER — Other Ambulatory Visit (HOSPITAL_COMMUNITY): Payer: Self-pay

## 2021-08-17 ENCOUNTER — Other Ambulatory Visit: Payer: Self-pay

## 2021-08-27 ENCOUNTER — Other Ambulatory Visit: Payer: Self-pay

## 2021-08-28 ENCOUNTER — Other Ambulatory Visit: Payer: Self-pay

## 2021-08-28 MED ORDER — METOPROLOL SUCCINATE ER 25 MG PO TB24
25.0000 mg | ORAL_TABLET | Freq: Every day | ORAL | 6 refills | Status: DC
  Filled 2021-08-28: qty 30, 30d supply, fill #0
  Filled 2021-10-02: qty 30, 30d supply, fill #1
  Filled 2021-11-06: qty 30, 30d supply, fill #2
  Filled 2021-12-04: qty 30, 30d supply, fill #3
  Filled 2022-01-02: qty 30, 30d supply, fill #4
  Filled 2022-01-30: qty 30, 30d supply, fill #5
  Filled 2022-03-07: qty 30, 30d supply, fill #6

## 2021-08-30 ENCOUNTER — Other Ambulatory Visit (HOSPITAL_COMMUNITY): Payer: Self-pay

## 2021-09-12 ENCOUNTER — Other Ambulatory Visit: Payer: Self-pay

## 2021-09-14 ENCOUNTER — Other Ambulatory Visit (HOSPITAL_COMMUNITY): Payer: Self-pay

## 2021-09-16 ENCOUNTER — Other Ambulatory Visit (HOSPITAL_COMMUNITY): Payer: Self-pay

## 2021-09-18 ENCOUNTER — Other Ambulatory Visit (HOSPITAL_COMMUNITY): Payer: Self-pay

## 2021-09-18 ENCOUNTER — Other Ambulatory Visit: Payer: Self-pay | Admitting: Pharmacist

## 2021-09-18 MED ORDER — TYMLOS 3120 MCG/1.56ML ~~LOC~~ SOPN
PEN_INJECTOR | SUBCUTANEOUS | 5 refills | Status: DC
  Filled 2021-09-18: qty 1.56, 28d supply, fill #0
  Filled 2021-10-13: qty 1.56, 28d supply, fill #1
  Filled 2021-11-14: qty 1.56, 28d supply, fill #2
  Filled 2021-12-22: qty 1.56, 28d supply, fill #3
  Filled 2022-01-16: qty 1.56, 28d supply, fill #4
  Filled 2022-02-19: qty 1.56, 28d supply, fill #5

## 2021-09-18 MED ORDER — TYMLOS 3120 MCG/1.56ML ~~LOC~~ SOPN: PEN_INJECTOR | SUBCUTANEOUS | 5 refills | Status: DC

## 2021-09-19 ENCOUNTER — Other Ambulatory Visit (HOSPITAL_COMMUNITY): Payer: Self-pay

## 2021-09-21 ENCOUNTER — Telehealth: Payer: Self-pay | Admitting: Family Medicine

## 2021-09-21 NOTE — Telephone Encounter (Signed)
I called and LVM for patient to call back.  Toni Demo,cma  

## 2021-09-21 NOTE — Telephone Encounter (Signed)
Pt called in stating that she did something to her neck and now she is having neck pain that is radiating into her arm. Transfer Pt to Access Nurse.

## 2021-09-22 ENCOUNTER — Other Ambulatory Visit: Payer: Self-pay

## 2021-09-22 ENCOUNTER — Other Ambulatory Visit: Payer: Self-pay | Admitting: Neurosurgery

## 2021-09-22 DIAGNOSIS — M5412 Radiculopathy, cervical region: Secondary | ICD-10-CM

## 2021-09-22 MED ORDER — CYCLOBENZAPRINE HCL 10 MG PO TABS
ORAL_TABLET | ORAL | 1 refills | Status: DC
  Filled 2021-09-22: qty 30, 10d supply, fill #0

## 2021-09-22 NOTE — Telephone Encounter (Signed)
Patient was called and VM left, according to chart patient was seen today.  Lindsey Carson,cma

## 2021-09-26 ENCOUNTER — Ambulatory Visit
Admission: RE | Admit: 2021-09-26 | Discharge: 2021-09-26 | Disposition: A | Discharge status: Home / Self Care | Payer: 59 | Source: Ambulatory Visit | Attending: Neurosurgery | Admitting: Neurosurgery

## 2021-09-26 ENCOUNTER — Other Ambulatory Visit: Payer: Self-pay

## 2021-09-26 DIAGNOSIS — M5412 Radiculopathy, cervical region: Secondary | ICD-10-CM | POA: Diagnosis not present

## 2021-09-26 DIAGNOSIS — M47812 Spondylosis without myelopathy or radiculopathy, cervical region: Secondary | ICD-10-CM | POA: Diagnosis not present

## 2021-09-26 DIAGNOSIS — M50223 Other cervical disc displacement at C6-C7 level: Secondary | ICD-10-CM | POA: Diagnosis not present

## 2021-09-26 DIAGNOSIS — M50221 Other cervical disc displacement at C4-C5 level: Secondary | ICD-10-CM | POA: Diagnosis not present

## 2021-09-26 DIAGNOSIS — M25512 Pain in left shoulder: Secondary | ICD-10-CM | POA: Diagnosis not present

## 2021-10-02 ENCOUNTER — Other Ambulatory Visit: Payer: Self-pay

## 2021-10-09 ENCOUNTER — Other Ambulatory Visit (HOSPITAL_COMMUNITY): Payer: Self-pay

## 2021-10-13 ENCOUNTER — Other Ambulatory Visit (HOSPITAL_COMMUNITY): Payer: Self-pay

## 2021-10-17 ENCOUNTER — Other Ambulatory Visit (HOSPITAL_COMMUNITY): Payer: Self-pay

## 2021-11-06 ENCOUNTER — Other Ambulatory Visit: Payer: Self-pay

## 2021-11-10 ENCOUNTER — Other Ambulatory Visit (HOSPITAL_COMMUNITY): Payer: Self-pay

## 2021-11-13 ENCOUNTER — Other Ambulatory Visit (HOSPITAL_COMMUNITY): Payer: Self-pay

## 2021-11-14 ENCOUNTER — Other Ambulatory Visit (HOSPITAL_COMMUNITY): Payer: Self-pay

## 2021-11-20 ENCOUNTER — Other Ambulatory Visit: Payer: Self-pay

## 2021-11-20 ENCOUNTER — Other Ambulatory Visit: Payer: Self-pay | Admitting: Family Medicine

## 2021-11-20 MED ORDER — NORTRIPTYLINE HCL 10 MG PO CAPS
ORAL_CAPSULE | Freq: Every day | ORAL | 1 refills | Status: DC
  Filled 2021-11-20: qty 90, 90d supply, fill #0
  Filled 2022-02-15: qty 90, 90d supply, fill #1

## 2021-11-21 ENCOUNTER — Ambulatory Visit: Payer: 59 | Attending: Family Medicine | Admitting: Pharmacist

## 2021-11-21 ENCOUNTER — Other Ambulatory Visit: Payer: Self-pay

## 2021-11-21 DIAGNOSIS — Z79899 Other long term (current) drug therapy: Secondary | ICD-10-CM

## 2021-11-21 NOTE — Progress Notes (Signed)
° °  S: Patient presents today for review of their specialty medication.   Patient is currently taking Tymlos for osteoporosis. Patient is managed by Dr. Forde Dandy for this.   Dosing: 80 mcg subq once daily  Adherence: confirmed  Efficacy: pt reports this is working okay for her  Monitoring:  - Pt w/ hx of tachycardia that starts after injecting Tymlos. She tells me that this has improved since changing her BP medication. - Orthostatic hypotension: none reported - Serum calcium: last level in Epic WNL - BMD: monitored by Dr. Forde Dandy    Current adverse effects: - No additional side effects reported other than tachycardia described above   O:  Lab Results  Component Value Date   WBC 5.3 04/06/2019   HGB 14.3 04/06/2019   HCT 40.6 04/06/2019   MCV 88.9 04/06/2019   PLT 158.0 04/06/2019      Chemistry      Component Value Date/Time   NA 139 12/11/2019 1024   K 3.9 12/11/2019 1024   CL 106 12/11/2019 1024   CO2 26 12/11/2019 1024   BUN 23 12/11/2019 1024   CREATININE 0.78 12/11/2019 1024      Component Value Date/Time   CALCIUM 10.0 12/11/2019 1024   ALKPHOS 107 04/06/2019 1401   AST 15 04/06/2019 1401   ALT 12 04/06/2019 1401   BILITOT 0.4 04/06/2019 1401       A/P: 1. Medication review: patient currently on Tymlos for osteoporosis. Reviewed the medication with the patient. She does not report any new side effects. She has no additional questions or concerns for me at this time. Benard Halsted, PharmD, [place], [place] [phone]

## 2021-11-22 ENCOUNTER — Other Ambulatory Visit (HOSPITAL_COMMUNITY): Payer: Self-pay

## 2021-11-24 ENCOUNTER — Ambulatory Visit: Payer: 59 | Admitting: Family Medicine

## 2021-12-05 ENCOUNTER — Other Ambulatory Visit: Payer: Self-pay

## 2021-12-18 ENCOUNTER — Other Ambulatory Visit (HOSPITAL_COMMUNITY): Payer: Self-pay

## 2021-12-20 ENCOUNTER — Other Ambulatory Visit (HOSPITAL_COMMUNITY): Payer: Self-pay

## 2021-12-22 ENCOUNTER — Other Ambulatory Visit (HOSPITAL_COMMUNITY): Payer: Self-pay

## 2021-12-25 ENCOUNTER — Other Ambulatory Visit (HOSPITAL_COMMUNITY): Payer: Self-pay

## 2022-01-02 ENCOUNTER — Other Ambulatory Visit: Payer: Self-pay

## 2022-01-09 DIAGNOSIS — L918 Other hypertrophic disorders of the skin: Secondary | ICD-10-CM | POA: Diagnosis not present

## 2022-01-09 DIAGNOSIS — D1801 Hemangioma of skin and subcutaneous tissue: Secondary | ICD-10-CM | POA: Diagnosis not present

## 2022-01-09 DIAGNOSIS — D225 Melanocytic nevi of trunk: Secondary | ICD-10-CM | POA: Diagnosis not present

## 2022-01-09 DIAGNOSIS — D2262 Melanocytic nevi of left upper limb, including shoulder: Secondary | ICD-10-CM | POA: Diagnosis not present

## 2022-01-09 DIAGNOSIS — L720 Epidermal cyst: Secondary | ICD-10-CM | POA: Diagnosis not present

## 2022-01-09 DIAGNOSIS — L821 Other seborrheic keratosis: Secondary | ICD-10-CM | POA: Diagnosis not present

## 2022-01-15 ENCOUNTER — Other Ambulatory Visit: Payer: Self-pay

## 2022-01-15 ENCOUNTER — Other Ambulatory Visit (HOSPITAL_COMMUNITY): Payer: Self-pay

## 2022-01-16 ENCOUNTER — Other Ambulatory Visit (HOSPITAL_COMMUNITY): Payer: Self-pay

## 2022-01-24 ENCOUNTER — Other Ambulatory Visit (HOSPITAL_COMMUNITY): Payer: Self-pay

## 2022-01-31 ENCOUNTER — Other Ambulatory Visit: Payer: Self-pay

## 2022-02-15 ENCOUNTER — Other Ambulatory Visit: Payer: Self-pay

## 2022-02-15 ENCOUNTER — Other Ambulatory Visit (HOSPITAL_COMMUNITY): Payer: Self-pay

## 2022-02-19 ENCOUNTER — Other Ambulatory Visit (HOSPITAL_COMMUNITY): Payer: Self-pay

## 2022-03-01 ENCOUNTER — Other Ambulatory Visit (HOSPITAL_COMMUNITY): Payer: Self-pay

## 2022-03-08 ENCOUNTER — Other Ambulatory Visit: Payer: Self-pay

## 2022-03-12 ENCOUNTER — Other Ambulatory Visit (HOSPITAL_COMMUNITY): Payer: Self-pay

## 2022-03-20 ENCOUNTER — Other Ambulatory Visit: Payer: Self-pay | Admitting: Family Medicine

## 2022-03-20 ENCOUNTER — Other Ambulatory Visit (HOSPITAL_COMMUNITY): Payer: Self-pay

## 2022-03-20 DIAGNOSIS — M810 Age-related osteoporosis without current pathological fracture: Secondary | ICD-10-CM | POA: Diagnosis not present

## 2022-03-20 DIAGNOSIS — Z6829 Body mass index (BMI) 29.0-29.9, adult: Secondary | ICD-10-CM | POA: Diagnosis not present

## 2022-03-20 DIAGNOSIS — Z13820 Encounter for screening for osteoporosis: Secondary | ICD-10-CM | POA: Diagnosis not present

## 2022-03-20 DIAGNOSIS — Z01419 Encounter for gynecological examination (general) (routine) without abnormal findings: Secondary | ICD-10-CM | POA: Diagnosis not present

## 2022-03-20 DIAGNOSIS — Z1231 Encounter for screening mammogram for malignant neoplasm of breast: Secondary | ICD-10-CM | POA: Diagnosis not present

## 2022-03-20 DIAGNOSIS — Z124 Encounter for screening for malignant neoplasm of cervix: Secondary | ICD-10-CM | POA: Diagnosis not present

## 2022-03-20 DIAGNOSIS — N952 Postmenopausal atrophic vaginitis: Secondary | ICD-10-CM | POA: Diagnosis not present

## 2022-03-20 LAB — HM PAP SMEAR: HM Pap smear: NORMAL

## 2022-03-20 LAB — HM MAMMOGRAPHY

## 2022-03-22 ENCOUNTER — Other Ambulatory Visit (HOSPITAL_COMMUNITY): Payer: Self-pay

## 2022-04-03 ENCOUNTER — Other Ambulatory Visit: Payer: Self-pay | Admitting: Family Medicine

## 2022-04-03 ENCOUNTER — Other Ambulatory Visit (HOSPITAL_COMMUNITY): Payer: Self-pay

## 2022-04-05 ENCOUNTER — Other Ambulatory Visit: Payer: Self-pay

## 2022-04-05 ENCOUNTER — Other Ambulatory Visit (HOSPITAL_COMMUNITY): Payer: Self-pay

## 2022-04-05 MED ORDER — METOPROLOL SUCCINATE ER 25 MG PO TB24
25.0000 mg | ORAL_TABLET | Freq: Every day | ORAL | 12 refills | Status: DC
  Filled 2022-04-05: qty 30, 30d supply, fill #0
  Filled 2022-05-05: qty 30, 30d supply, fill #1
  Filled 2022-06-04: qty 30, 30d supply, fill #2
  Filled 2022-07-02: qty 30, 30d supply, fill #3
  Filled 2022-08-02: qty 30, 30d supply, fill #4
  Filled 2022-09-19: qty 30, 30d supply, fill #5
  Filled 2022-10-18: qty 30, 30d supply, fill #6
  Filled 2022-11-15: qty 30, 30d supply, fill #7
  Filled 2022-12-17: qty 30, 30d supply, fill #8
  Filled 2023-01-14: qty 30, 30d supply, fill #9
  Filled 2023-02-14: qty 30, 30d supply, fill #10
  Filled 2023-03-20: qty 30, 30d supply, fill #11

## 2022-04-06 ENCOUNTER — Other Ambulatory Visit (HOSPITAL_COMMUNITY): Payer: Self-pay

## 2022-04-06 ENCOUNTER — Other Ambulatory Visit: Payer: Self-pay

## 2022-04-06 ENCOUNTER — Other Ambulatory Visit: Payer: Self-pay | Admitting: Pharmacist

## 2022-04-06 MED ORDER — TYMLOS 3120 MCG/1.56ML ~~LOC~~ SOPN
PEN_INJECTOR | SUBCUTANEOUS | 5 refills | Status: DC
  Filled 2022-04-06: qty 1.56, fill #0
  Filled 2022-04-06: qty 1.56, 28d supply, fill #0
  Filled 2022-04-24: qty 1.56, 28d supply, fill #1
  Filled 2022-05-22: qty 1.56, 28d supply, fill #2
  Filled 2022-06-19: qty 1.56, 28d supply, fill #3
  Filled 2022-07-23 – 2022-08-04 (×2): qty 1.56, 28d supply, fill #4
  Filled 2022-09-06: qty 1.56, 28d supply, fill #5

## 2022-04-06 MED ORDER — TYMLOS 3120 MCG/1.56ML ~~LOC~~ SOPN: PEN_INJECTOR | SUBCUTANEOUS | 5 refills | Status: DC

## 2022-04-09 ENCOUNTER — Encounter: Payer: Self-pay | Admitting: Family Medicine

## 2022-04-09 ENCOUNTER — Other Ambulatory Visit: Payer: Self-pay

## 2022-04-09 ENCOUNTER — Ambulatory Visit: Payer: 59 | Admitting: Family Medicine

## 2022-04-09 DIAGNOSIS — M810 Age-related osteoporosis without current pathological fracture: Secondary | ICD-10-CM | POA: Diagnosis not present

## 2022-04-09 DIAGNOSIS — J309 Allergic rhinitis, unspecified: Secondary | ICD-10-CM

## 2022-04-09 DIAGNOSIS — F419 Anxiety disorder, unspecified: Secondary | ICD-10-CM | POA: Diagnosis not present

## 2022-04-09 DIAGNOSIS — I10 Essential (primary) hypertension: Secondary | ICD-10-CM | POA: Diagnosis not present

## 2022-04-09 DIAGNOSIS — E669 Obesity, unspecified: Secondary | ICD-10-CM | POA: Diagnosis not present

## 2022-04-09 DIAGNOSIS — F32A Depression, unspecified: Secondary | ICD-10-CM

## 2022-04-09 MED ORDER — HYDROXYZINE HCL 10 MG PO TABS
10.0000 mg | ORAL_TABLET | Freq: Three times a day (TID) | ORAL | 0 refills | Status: DC | PRN
  Filled 2022-04-09: qty 30, 10d supply, fill #0

## 2022-04-09 MED ORDER — AZELASTINE HCL 0.1 % NA SOLN
2.0000 | Freq: Two times a day (BID) | NASAL | 12 refills | Status: DC
  Filled 2022-04-09: qty 30, 30d supply, fill #0

## 2022-04-09 NOTE — Assessment & Plan Note (Signed)
She will continue to follow with her endocrinologist.  She will discuss whether or not she should stay on the [facility] with them when she follows up.

## 2022-04-09 NOTE — Assessment & Plan Note (Signed)
I encouraged adequate exercise and dietary changes.

## 2022-04-09 NOTE — Patient Instructions (Signed)
Nice to see you. Please try the Astelin for your allergy symptoms.  If your cough does not improve with this please let me know and we can get a chest x-ray. You can try the hydroxyzine for your anxiety.  If it makes you excessively drowsy please discontinue it and do not drive. Please try to work on diet and exercise.

## 2022-04-09 NOTE — Assessment & Plan Note (Signed)
Discussed treatment options including increasing the nortriptyline dose versus changing it to something different versus medication to help with day-to-day anxiety.  She opted for a medicine to take as needed and we will prescribe hydroxyzine 10 mg 3 times daily as needed for anxiety.  Discussed risk of drowsiness with this.  Advised if she is excessively drowsy she should not drive and she should discontinue the medication.

## 2022-04-09 NOTE — Progress Notes (Signed)
Tommi Rumps, MD Phone: [phone]  Lindsey Carson is a 56 y.o. female who presents today for f/u.  HYPERTENSION Disease Monitoring Home BP Monitoring not checking Chest pain- no    Dyspnea- no Medications Compliance-  taking metoprolol. BMET    Component Value Date/Time   NA 139 12/11/2019 1024   K 3.9 12/11/2019 1024   CL 106 12/11/2019 1024   CO2 26 12/11/2019 1024   GLUCOSE 99 12/11/2019 1024   BUN 23 12/11/2019 1024   CREATININE 0.78 12/11/2019 1024   CALCIUM 10.0 12/11/2019 1024   GFRNONAA >60 08/27/2018 1411   GFRAA >60 08/27/2018 1411   Anxiety: Patient has a lot of stress and anxiety with stuff going on with her husband.  She notes occasional depression.  No SI.  She is on nortriptyline.  She has not taken clonazepam anytime recently.  Osteoporosis: She continues to follow with endocrinology.  She is on Tymlos.  She gets palpitations and shortness of breath within 30 minutes of taking the shot and that resolves as the day goes on.  She is on metoprolol to help with the symptoms.  Obesity: Patient has not been exercising and eats what ever she wants.  Allergic rhinitis: Patient notes she had a sinus infection back in January.  Since then she has had a cough.  She notes no reflux.  She does have allergy symptoms with postnasal drip and rhinorrhea.  No sneezing.  She uses Nasacort and Zyrtec and still has the after mentioned symptoms.  Social History   Tobacco Use  Smoking Status Never  Smokeless Tobacco Never    Current Outpatient Medications on File Prior to Visit  Medication Sig Dispense Refill   Abaloparatide (TYMLOS) 3120 MCG/1.56ML SOPN INJECT 1 PEN (80 MCG) UNDER THE SKIN ONCE DAILY AS DIRECTED 1.56 mL 5   aspirin EC 81 MG tablet Take 1 tablet (81 mg total) by mouth daily. (Patient taking differently: Take 81 mg by mouth at bedtime.) 90 tablet 3   cetirizine (ZYRTEC) 10 MG tablet Take 10 mg by mouth at bedtime.      Cholecalciferol (VITAMIN D) 2000 units  CAPS Take by mouth at bedtime.     Insulin Pen Needle 32G X 4 MM MISC use as directed 100 each 11   Magnesium 250 MG TABS Take 250 mg by mouth at bedtime.     metoprolol succinate (TOPROL-XL) 25 MG 24 hr tablet TAKE 1 TABLET BY MOUTH ONCE A DAY 30 tablet 12   nortriptyline (PAMELOR) 10 MG capsule TAKE 1 CAPSULE BY MOUTH AT BEDTIME. 90 capsule 1   triamcinolone (NASACORT) 55 MCG/ACT nasal inhaler Place 1 spray into the nose daily.      No current facility-administered medications on file prior to visit.     ROS see history of present illness  Objective  Physical Exam Vitals:   04/09/22 1418  BP: 120/80  Pulse: 90  Temp: 98.1 F (36.7 C)  SpO2: 96%    BP Readings from Last 3 Encounters:  04/09/22 120/80  05/23/21 125/79  01/28/20 105/60   Wt Readings from Last 3 Encounters:  04/09/22 171 lb 6.4 oz (77.7 kg)  06/09/21 169 lb (76.7 kg)  05/23/21 169 lb (76.7 kg)    Physical Exam Constitutional:      General: She is not in acute distress.    Appearance: She is not diaphoretic.  Cardiovascular:     Rate and Rhythm: Normal rate and regular rhythm.     Heart sounds: Normal  heart sounds.  Pulmonary:     Effort: Pulmonary effort is normal.     Breath sounds: Normal breath sounds.  Skin:    General: Skin is warm and dry.  Neurological:     Mental Status: She is alert.     Assessment/Plan: Please see individual problem list.  Problem List Items Addressed This Visit     Allergic rhinitis (Chronic)    I suspect her cough symptoms are related to allergic rhinitis.  She will continue Nasacort and Zyrtec.  We will add Astelin.  If not improving with this she will let us know and we can consider a chest x-ray for her cough.       Relevant Medications   azelastine (ASTELIN) 0.1 % nasal spray   Anxiety and depression (Chronic)    Discussed treatment options including increasing the nortriptyline dose versus changing it to something different versus medication to help with  day-to-day anxiety.  She opted for a medicine to take as needed and we will prescribe hydroxyzine 10 mg 3 times daily as needed for anxiety.  Discussed risk of drowsiness with this.  Advised if she is excessively drowsy she should not drive and she should discontinue the medication.       Relevant Medications   hydrOXYzine (ATARAX) 10 MG tablet   Hypertension (Chronic)    Adequately controlled.  She will continue metoprolol succinate 25 mg daily.       Relevant Orders   Comp Met (CMET)   Lipid panel   Obesity (BMI 30.0-34.9) (Chronic)    I encouraged adequate exercise and dietary changes.       Relevant Orders   HgB A1c   Osteoporosis (Chronic)    She will continue to follow with her endocrinologist.  She will discuss whether or not she should stay on the Tymlos with them when she follows up.       Return in about 3 months (around 07/10/2022) for Anxiety/depression.   Tommi Rumps, MD [place]

## 2022-04-09 NOTE — Assessment & Plan Note (Signed)
I suspect her cough symptoms are related to allergic rhinitis.  She will continue Nasacort and Zyrtec.  We will add Astelin.  If not improving with this she will let us know and we can consider a chest x-ray for her cough.

## 2022-04-09 NOTE — Assessment & Plan Note (Signed)
Adequately controlled.  She will continue metoprolol succinate 25 mg daily.

## 2022-04-10 LAB — COMPREHENSIVE METABOLIC PANEL
ALT: 28 U/L (ref 0–35)
AST: 22 U/L (ref 0–37)
Albumin: 4.2 g/dL (ref 3.5–5.2)
Alkaline Phosphatase: 121 U/L — ABNORMAL HIGH (ref 39–117)
BUN: 18 mg/dL (ref 6–23)
CO2: 24 mEq/L (ref 19–32)
Calcium: 9.7 mg/dL (ref 8.4–10.5)
Chloride: 107 mEq/L (ref 96–112)
Creatinine, Ser: 0.94 mg/dL (ref 0.40–1.20)
GFR: 68 mL/min (ref >60.00)
Glucose, Bld: 142 mg/dL — ABNORMAL HIGH (ref 70–99)
Potassium: 4.1 mEq/L (ref 3.5–5.1)
Sodium: 142 mEq/L (ref 135–145)
Total Bilirubin: 0.6 mg/dL (ref 0.2–1.2)
Total Protein: 6.3 g/dL (ref 6.0–8.3)

## 2022-04-10 LAB — LIPID PANEL
Cholesterol: 235 mg/dL — ABNORMAL HIGH (ref 0–200)
HDL: 38.8 mg/dL — ABNORMAL LOW (ref >39.00)
NonHDL: 195.7
Total CHOL/HDL Ratio: 6
Triglycerides: 248 mg/dL — ABNORMAL HIGH (ref 0.0–149.0)
VLDL: 49.6 mg/dL — ABNORMAL HIGH (ref 0.0–40.0)

## 2022-04-10 LAB — HEMOGLOBIN A1C: Hgb A1c MFr Bld: 6.2 % (ref 4.6–6.5)

## 2022-04-10 LAB — LDL CHOLESTEROL, DIRECT: Direct LDL: 160 mg/dL

## 2022-04-11 LAB — HM DEXA SCAN

## 2022-04-24 ENCOUNTER — Other Ambulatory Visit (HOSPITAL_COMMUNITY): Payer: Self-pay

## 2022-05-03 ENCOUNTER — Other Ambulatory Visit (HOSPITAL_COMMUNITY): Payer: Self-pay

## 2022-05-07 ENCOUNTER — Other Ambulatory Visit: Payer: Self-pay

## 2022-05-16 ENCOUNTER — Other Ambulatory Visit: Payer: Self-pay

## 2022-05-16 ENCOUNTER — Other Ambulatory Visit: Payer: Self-pay | Admitting: Family

## 2022-05-17 ENCOUNTER — Other Ambulatory Visit: Payer: Self-pay

## 2022-05-17 MED ORDER — INSULIN PEN NEEDLE 32G X 4 MM MISC
11 refills | Status: DC
  Filled 2022-05-17: qty 100, 90d supply, fill #0
  Filled 2022-12-04: qty 100, 90d supply, fill #1
  Filled 2023-04-29: qty 100, 90d supply, fill #2

## 2022-05-18 ENCOUNTER — Other Ambulatory Visit: Payer: Self-pay

## 2022-05-18 ENCOUNTER — Other Ambulatory Visit: Payer: Self-pay | Admitting: Family Medicine

## 2022-05-18 MED FILL — Nortriptyline HCl Cap 10 MG: ORAL | 90 days supply | Qty: 90 | Fill #0 | Status: AC

## 2022-05-22 ENCOUNTER — Other Ambulatory Visit (HOSPITAL_COMMUNITY): Payer: Self-pay

## 2022-05-30 ENCOUNTER — Other Ambulatory Visit (HOSPITAL_COMMUNITY): Payer: Self-pay

## 2022-06-05 ENCOUNTER — Other Ambulatory Visit: Payer: Self-pay

## 2022-06-18 ENCOUNTER — Other Ambulatory Visit (HOSPITAL_COMMUNITY): Payer: Self-pay

## 2022-06-18 DIAGNOSIS — I10 Essential (primary) hypertension: Secondary | ICD-10-CM | POA: Diagnosis not present

## 2022-06-18 DIAGNOSIS — G43909 Migraine, unspecified, not intractable, without status migrainosus: Secondary | ICD-10-CM | POA: Diagnosis not present

## 2022-06-18 DIAGNOSIS — R946 Abnormal results of thyroid function studies: Secondary | ICD-10-CM | POA: Diagnosis not present

## 2022-06-18 DIAGNOSIS — R7302 Impaired glucose tolerance (oral): Secondary | ICD-10-CM | POA: Diagnosis not present

## 2022-06-18 DIAGNOSIS — E213 Hyperparathyroidism, unspecified: Secondary | ICD-10-CM | POA: Diagnosis not present

## 2022-06-18 DIAGNOSIS — M810 Age-related osteoporosis without current pathological fracture: Secondary | ICD-10-CM | POA: Diagnosis not present

## 2022-06-18 DIAGNOSIS — R000 Tachycardia, unspecified: Secondary | ICD-10-CM | POA: Diagnosis not present

## 2022-06-18 DIAGNOSIS — K760 Fatty (change of) liver, not elsewhere classified: Secondary | ICD-10-CM | POA: Diagnosis not present

## 2022-06-18 DIAGNOSIS — E559 Vitamin D deficiency, unspecified: Secondary | ICD-10-CM | POA: Diagnosis not present

## 2022-06-19 ENCOUNTER — Other Ambulatory Visit (HOSPITAL_COMMUNITY): Payer: Self-pay

## 2022-06-26 ENCOUNTER — Other Ambulatory Visit: Payer: Self-pay

## 2022-06-26 MED ORDER — LEVOTHYROXINE SODIUM 100 MCG PO TABS
ORAL_TABLET | ORAL | 3 refills | Status: DC
  Filled 2022-06-26: qty 90, 90d supply, fill #0
  Filled 2022-09-19: qty 90, 90d supply, fill #1

## 2022-06-28 ENCOUNTER — Other Ambulatory Visit (HOSPITAL_COMMUNITY): Payer: Self-pay

## 2022-07-03 ENCOUNTER — Other Ambulatory Visit: Payer: Self-pay

## 2022-07-16 ENCOUNTER — Ambulatory Visit: Payer: 59 | Admitting: Family Medicine

## 2022-07-17 ENCOUNTER — Other Ambulatory Visit (HOSPITAL_COMMUNITY): Payer: Self-pay

## 2022-07-18 ENCOUNTER — Other Ambulatory Visit (HOSPITAL_COMMUNITY): Payer: Self-pay

## 2022-07-19 ENCOUNTER — Other Ambulatory Visit (HOSPITAL_COMMUNITY): Payer: Self-pay

## 2022-07-20 ENCOUNTER — Other Ambulatory Visit: Payer: Self-pay | Admitting: Neurosurgery

## 2022-07-20 DIAGNOSIS — M5416 Radiculopathy, lumbar region: Secondary | ICD-10-CM

## 2022-07-23 ENCOUNTER — Other Ambulatory Visit (HOSPITAL_COMMUNITY): Payer: Self-pay

## 2022-07-26 ENCOUNTER — Ambulatory Visit
Admission: RE | Admit: 2022-07-26 | Discharge: 2022-07-26 | Disposition: A | Discharge status: Home / Self Care | Payer: 59 | Source: Ambulatory Visit | Attending: Neurosurgery | Admitting: Neurosurgery

## 2022-07-26 DIAGNOSIS — M5416 Radiculopathy, lumbar region: Secondary | ICD-10-CM | POA: Diagnosis not present

## 2022-07-26 DIAGNOSIS — Z6830 Body mass index (BMI) 30.0-30.9, adult: Secondary | ICD-10-CM | POA: Diagnosis not present

## 2022-07-26 DIAGNOSIS — M5127 Other intervertebral disc displacement, lumbosacral region: Secondary | ICD-10-CM | POA: Diagnosis not present

## 2022-07-27 ENCOUNTER — Ambulatory Visit: Payer: 59 | Admitting: Family Medicine

## 2022-07-27 ENCOUNTER — Encounter: Payer: Self-pay | Admitting: Family Medicine

## 2022-07-27 DIAGNOSIS — E213 Hyperparathyroidism, unspecified: Secondary | ICD-10-CM

## 2022-07-27 DIAGNOSIS — Z8349 Family history of other endocrine, nutritional and metabolic diseases: Secondary | ICD-10-CM

## 2022-07-27 DIAGNOSIS — F32A Depression, unspecified: Secondary | ICD-10-CM | POA: Diagnosis not present

## 2022-07-27 DIAGNOSIS — F419 Anxiety disorder, unspecified: Secondary | ICD-10-CM

## 2022-07-27 DIAGNOSIS — E039 Hypothyroidism, unspecified: Secondary | ICD-10-CM | POA: Diagnosis not present

## 2022-07-27 DIAGNOSIS — R748 Abnormal levels of other serum enzymes: Secondary | ICD-10-CM

## 2022-07-27 DIAGNOSIS — E7800 Pure hypercholesterolemia, unspecified: Secondary | ICD-10-CM

## 2022-07-27 DIAGNOSIS — E669 Obesity, unspecified: Secondary | ICD-10-CM

## 2022-07-27 DIAGNOSIS — E785 Hyperlipidemia, unspecified: Secondary | ICD-10-CM | POA: Insufficient documentation

## 2022-07-27 LAB — COMPREHENSIVE METABOLIC PANEL
ALT: 25 U/L (ref 0–35)
AST: 20 U/L (ref 0–37)
Albumin: 4 g/dL (ref 3.5–5.2)
Alkaline Phosphatase: 106 U/L (ref 39–117)
BUN: 15 mg/dL (ref 6–23)
CO2: 26 mEq/L (ref 19–32)
Calcium: 9.4 mg/dL (ref 8.4–10.5)
Chloride: 107 mEq/L (ref 96–112)
Creatinine, Ser: 0.9 mg/dL (ref 0.40–1.20)
GFR: 71.5 mL/min (ref >60.00)
Glucose, Bld: 101 mg/dL — ABNORMAL HIGH (ref 70–99)
Potassium: 4.2 mEq/L (ref 3.5–5.1)
Sodium: 141 mEq/L (ref 135–145)
Total Bilirubin: 0.4 mg/dL (ref 0.2–1.2)
Total Protein: 6.2 g/dL (ref 6.0–8.3)

## 2022-07-27 LAB — GAMMA GT: GGT: 17 U/L (ref 7–51)

## 2022-07-27 NOTE — Assessment & Plan Note (Signed)
Encouraged diet and exercise.  Patient has declined statin therapy at this time.  We will plan on rechecking at her next visit.

## 2022-07-27 NOTE — Patient Instructions (Signed)
Nice to see you. Please try to start exercising 2 to 3 days a week for 20 to 30 minutes at a time.  Please work on [employer] as we discussed. We will contact you with your lab results.

## 2022-07-27 NOTE — Progress Notes (Signed)
Tommi Rumps, MD Phone: [phone]  Lindsey Carson is a 56 y.o. female who presents today for f/u.  HYPERTENSION Disease Monitoring Home BP Monitoring [id number] systolic Chest pain- no    Dyspnea- no Medications Compliance-  taking metoprolol.   BMET    Component Value Date/Time   NA 142 04/09/2022 1436   K 4.1 04/09/2022 1436   CL 107 04/09/2022 1436   CO2 24 04/09/2022 1436   GLUCOSE 142 (H) 04/09/2022 1436   BUN 18 04/09/2022 1436   CREATININE 0.94 04/09/2022 1436   CALCIUM 9.7 04/09/2022 1436   GFRNONAA >60 08/27/2018 1411   GFRAA >60 08/27/2018 1411   HYPOTHYROIDISM Disease Monitoring Weight changes: no  Skin Changes: no Heat/Cold intolerance: no  Medication Monitoring Compliance:  taking synthroid   Last TSH:   Lab Results  Component Value Date   TSH 3.75 04/06/2019   Hyperparathyroidism: Continues to follow with endocrinology.  She needs an alkaline phosphatase rechecked.  Anxiety/depression: Patient notes he things are situational with what is going on at home.  She tried hydroxyzine and it was not beneficial.  No SI or HI.  Obesity: She notes her diet is bad.  She does not exercise.  Family history of hereditary hemochromatosis: Patient has not been evaluated for this in the past.  Social History   Tobacco Use  Smoking Status Never  Smokeless Tobacco Never    Current Outpatient Medications on File Prior to Visit  Medication Sig Dispense Refill   Abaloparatide (TYMLOS) 3120 MCG/1.56ML SOPN INJECT 1 PEN (80 MCG) UNDER THE SKIN ONCE DAILY AS DIRECTED 1.56 mL 5   aspirin EC 81 MG tablet Take 1 tablet (81 mg total) by mouth daily. (Patient taking differently: Take 81 mg by mouth at bedtime.) 90 tablet 3   azelastine (ASTELIN) 0.1 % nasal spray Place 2 sprays into both nostrils 2 (two) times daily. Use in each nostril as directed 30 mL 12   cetirizine (ZYRTEC) 10 MG tablet Take 10 mg by mouth at bedtime.      Cholecalciferol (VITAMIN D) 2000 units CAPS  Take by mouth at bedtime.     hydrOXYzine (ATARAX) 10 MG tablet Take 1 tablet (10 mg total) by mouth 3 (three) times daily as needed. 30 tablet 0   Insulin Pen Needle 32G X 4 MM MISC Use as directed 100 each 11   levothyroxine (SYNTHROID) 100 MCG tablet 1 tablet in the morning on an empty stomach Orally Once a day 90 days 90 tablet 3   Magnesium 250 MG TABS Take 250 mg by mouth at bedtime.     metoprolol succinate (TOPROL-XL) 25 MG 24 hr tablet TAKE 1 TABLET BY MOUTH ONCE A DAY 30 tablet 12   nortriptyline (PAMELOR) 10 MG capsule TAKE 1 CAPSULE BY MOUTH AT BEDTIME. 90 capsule 1   triamcinolone (NASACORT) 55 MCG/ACT nasal inhaler Place 1 spray into the nose daily.      No current facility-administered medications on file prior to visit.     ROS see history of present illness  Objective  Physical Exam Vitals:   07/27/22 0933  BP: 110/70  Pulse: 77  Temp: 97.7 F (36.5 C)  SpO2: 97%    BP Readings from Last 3 Encounters:  07/27/22 110/70  04/09/22 120/80  05/23/21 125/79   Wt Readings from Last 3 Encounters:  07/27/22 172 lb 3.2 oz (78.1 kg)  04/09/22 171 lb 6.4 oz (77.7 kg)  06/09/21 169 lb (76.7 kg)    Physical Exam  Constitutional:      General: She is not in acute distress.    Appearance: She is not diaphoretic.  Cardiovascular:     Rate and Rhythm: Normal rate and regular rhythm.     Heart sounds: Normal heart sounds.  Pulmonary:     Effort: Pulmonary effort is normal.     Breath sounds: Normal breath sounds.  Skin:    General: Skin is warm and dry.  Neurological:     Mental Status: She is alert.      Assessment/Plan: Please see individual problem list.  Problem List Items Addressed This Visit     Anxiety and depression (Chronic)    Stable.  She will monitor as she declines increasing the dose of the hydroxyzine.      Hyperlipidemia (Chronic)    Encouraged diet and exercise.  Patient has declined statin therapy at this time.  We will plan on  rechecking at her next visit.      Hyperparathyroidism ([place]) (Chronic)    She will continue to follow with endocrinology.      Obesity (BMI 30.0-34.9) (Chronic)    Discussed adding in exercise 2 to 3 days a week for 20 to 30 minutes at a time.  Discussed reduction in fried fatty foods, sweets, carbohydrates.  Discussed increasing fruits and vegetables.      Elevated alkaline phosphatase level    Discussed this is likely related to her hyperparathyroidism and vitamin D deficiency.  We will recheck today and get a GGT to make sure there is no liver involvement.      Relevant Orders   Comp Met (CMET)   Gamma GT   Family history of hemochromatosis    Check labs.      Relevant Orders   Hemochromatosis DNA-PCR(c282y,h63d)   Hypothyroidism    Continue Synthroid 100 mcg once daily.  She will continue to see endocrinology.        Return in about 3 months (around 10/26/2022) for weight follow-up.   Tommi Rumps, MD [place]

## 2022-07-27 NOTE — Assessment & Plan Note (Signed)
Continue Synthroid 100 mcg once daily.  She will continue to see endocrinology.

## 2022-07-27 NOTE — Assessment & Plan Note (Signed)
She will continue to follow with endocrinology.

## 2022-07-27 NOTE — Assessment & Plan Note (Signed)
Discussed this is likely related to her hyperparathyroidism and vitamin D deficiency.  We will recheck today and get a GGT to make sure there is no liver involvement.

## 2022-07-27 NOTE — Assessment & Plan Note (Signed)
Check labs 

## 2022-07-27 NOTE — Assessment & Plan Note (Signed)
Discussed adding in exercise 2 to 3 days a week for 20 to 30 minutes at a time.  Discussed reduction in fried fatty foods, sweets, carbohydrates.  Discussed increasing fruits and vegetables.

## 2022-07-27 NOTE — Assessment & Plan Note (Signed)
Stable.  She will monitor as she declines increasing the dose of the hydroxyzine.

## 2022-08-02 ENCOUNTER — Other Ambulatory Visit: Payer: Self-pay

## 2022-08-02 ENCOUNTER — Other Ambulatory Visit (HOSPITAL_COMMUNITY): Payer: Self-pay

## 2022-08-03 ENCOUNTER — Other Ambulatory Visit (HOSPITAL_COMMUNITY): Payer: Self-pay

## 2022-08-04 ENCOUNTER — Other Ambulatory Visit (HOSPITAL_COMMUNITY): Payer: Self-pay

## 2022-08-06 ENCOUNTER — Ambulatory Visit: Payer: 59 | Attending: Anesthesiology | Admitting: Anesthesiology

## 2022-08-06 ENCOUNTER — Encounter: Payer: Self-pay | Admitting: Anesthesiology

## 2022-08-06 VITALS — BP 138/85 | HR 78 | Temp 97.2°F | Resp 18 | Ht 63.0 in | Wt 173.0 lb

## 2022-08-06 DIAGNOSIS — M5431 Sciatica, right side: Secondary | ICD-10-CM

## 2022-08-06 DIAGNOSIS — M5432 Sciatica, left side: Secondary | ICD-10-CM

## 2022-08-06 DIAGNOSIS — M5136 Other intervertebral disc degeneration, lumbar region: Secondary | ICD-10-CM

## 2022-08-06 NOTE — Progress Notes (Signed)
Safety precautions to be maintained throughout the outpatient stay will include: orient to surroundings, keep bed in low position, maintain call bell within reach at all times, provide assistance with transfer out of bed and ambulation.  

## 2022-08-07 LAB — HEMOCHROMATOSIS DNA-PCR(C282Y,H63D)

## 2022-08-08 ENCOUNTER — Other Ambulatory Visit (HOSPITAL_COMMUNITY): Payer: Self-pay

## 2022-08-09 ENCOUNTER — Other Ambulatory Visit (HOSPITAL_COMMUNITY): Payer: Self-pay

## 2022-08-10 ENCOUNTER — Other Ambulatory Visit (HOSPITAL_COMMUNITY): Payer: Self-pay

## 2022-08-13 MED FILL — Nortriptyline HCl Cap 10 MG: ORAL | 90 days supply | Qty: 90 | Fill #1 | Status: AC

## 2022-08-14 ENCOUNTER — Other Ambulatory Visit: Payer: Self-pay

## 2022-08-20 ENCOUNTER — Encounter: Payer: Self-pay | Admitting: Anesthesiology

## 2022-08-20 ENCOUNTER — Ambulatory Visit (HOSPITAL_BASED_OUTPATIENT_CLINIC_OR_DEPARTMENT_OTHER): Payer: 59 | Admitting: Anesthesiology

## 2022-08-20 ENCOUNTER — Ambulatory Visit
Admission: RE | Admit: 2022-08-20 | Discharge: 2022-08-20 | Disposition: A | Discharge status: Home / Self Care | Payer: 59 | Source: Ambulatory Visit | Attending: Anesthesiology | Admitting: Anesthesiology

## 2022-08-20 ENCOUNTER — Other Ambulatory Visit: Payer: Self-pay | Admitting: Anesthesiology

## 2022-08-20 VITALS — BP 138/84 | HR 84 | Temp 97.5°F | Resp 16 | Ht 63.0 in | Wt 170.0 lb

## 2022-08-20 DIAGNOSIS — M5441 Lumbago with sciatica, right side: Secondary | ICD-10-CM | POA: Diagnosis not present

## 2022-08-20 DIAGNOSIS — M5431 Sciatica, right side: Secondary | ICD-10-CM | POA: Insufficient documentation

## 2022-08-20 DIAGNOSIS — M47816 Spondylosis without myelopathy or radiculopathy, lumbar region: Secondary | ICD-10-CM | POA: Diagnosis not present

## 2022-08-20 DIAGNOSIS — M5442 Lumbago with sciatica, left side: Secondary | ICD-10-CM | POA: Insufficient documentation

## 2022-08-20 DIAGNOSIS — M5432 Sciatica, left side: Secondary | ICD-10-CM | POA: Diagnosis not present

## 2022-08-20 DIAGNOSIS — M5116 Intervertebral disc disorders with radiculopathy, lumbar region: Secondary | ICD-10-CM | POA: Diagnosis not present

## 2022-08-20 DIAGNOSIS — R52 Pain, unspecified: Secondary | ICD-10-CM | POA: Insufficient documentation

## 2022-08-20 DIAGNOSIS — G8929 Other chronic pain: Secondary | ICD-10-CM | POA: Insufficient documentation

## 2022-08-20 DIAGNOSIS — Z9889 Other specified postprocedural states: Secondary | ICD-10-CM | POA: Diagnosis present

## 2022-08-20 DIAGNOSIS — M5136 Other intervertebral disc degeneration, lumbar region: Secondary | ICD-10-CM | POA: Insufficient documentation

## 2022-08-20 DIAGNOSIS — M51369 Other intervertebral disc degeneration, lumbar region without mention of lumbar back pain or lower extremity pain: Secondary | ICD-10-CM

## 2022-08-20 MED ORDER — SODIUM CHLORIDE 0.9% FLUSH
10.0000 mL | Freq: Once | INTRAVENOUS | Status: AC
  Administered 2022-08-20: 10 mL

## 2022-08-20 MED ORDER — IOHEXOL 180 MG/ML  SOLN
10.0000 mL | Freq: Once | INTRAMUSCULAR | Status: AC | PRN
  Administered 2022-08-20: 10 mL via EPIDURAL
  Filled 2022-08-20: qty 20

## 2022-08-20 MED ORDER — LIDOCAINE HCL (PF) 1 % IJ SOLN
5.0000 mL | Freq: Once | INTRAMUSCULAR | Status: AC
  Administered 2022-08-20: 5 mL via SUBCUTANEOUS
  Filled 2022-08-20: qty 5

## 2022-08-20 MED ORDER — TRIAMCINOLONE ACETONIDE 40 MG/ML IJ SUSP
40.0000 mg | Freq: Once | INTRAMUSCULAR | Status: AC
  Administered 2022-08-20: 40 mg
  Filled 2022-08-20: qty 1

## 2022-08-20 MED ORDER — ROPIVACAINE HCL 2 MG/ML IJ SOLN
10.0000 mL | Freq: Once | INTRAMUSCULAR | Status: AC
  Administered 2022-08-20: 1 mL via EPIDURAL
  Filled 2022-08-20: qty 20

## 2022-08-20 NOTE — Patient Instructions (Signed)
Pain Management Discharge Instructions  General Discharge Instructions :  If you need to reach your doctor call: Monday-Friday 8:00 am - 4:00 pm at [phone] or toll free [phone].  After clinic hours [phone] to have operator reach doctor.  Bring all of your medication bottles to all your appointments in the pain clinic.  To cancel or reschedule your appointment with Pain Management please remember to call 24 hours in advance to avoid a fee.  Refer to the educational materials which you have been given on: General Risks, I had my Procedure. Discharge Instructions, Post Sedation.  Post Procedure Instructions:  The drugs you were given will stay in your system until tomorrow, so for the next 24 hours you should not drive, make any legal decisions or drink any alcoholic beverages.  You may eat anything you prefer, but it is better to start with liquids then soups and crackers, and gradually work up to solid foods.  Please notify your doctor immediately if you have any unusual bleeding, trouble breathing or pain that is not related to your normal pain.  Depending on the type of procedure that was done, some parts of your body may feel week and/or numb.  This usually clears up by tonight or the next day.  Walk with the use of an assistive device or accompanied by an adult for the 24 hours.  You may use ice on the affected area for the first 24 hours.  Put ice in a Ziploc bag and cover with a towel and place against area 15 minutes on 15 minutes off.  You may switch to heat after 24 hours.Epidural Steroid Injection Patient Information  Description: The epidural space surrounds the nerves as they exit the spinal cord.  In some patients, the nerves can be compressed and inflamed by a bulging disc or a tight spinal canal (spinal stenosis).  By injecting steroids into the epidural space, we can bring irritated nerves into direct contact with a potentially helpful medication.  These  steroids act directly on the irritated nerves and can reduce swelling and inflammation which often leads to decreased pain.  Epidural steroids may be injected anywhere along the spine and from the neck to the low back depending upon the location of your pain.   After numbing the skin with local anesthetic (like Novocaine), a small needle is passed into the epidural space slowly.  You may experience a sensation of pressure while this is being done.  The entire block usually last less than 10 minutes.  Conditions which may be treated by epidural steroids:  Low back and leg pain Neck and arm pain Spinal stenosis Post-laminectomy syndrome Herpes zoster (shingles) pain Pain from compression fractures  Preparation for the injection:  Do not eat any solid food or dairy products within 8 hours of your appointment.  You may drink clear liquids up to 3 hours before appointment.  Clear liquids include water, black coffee, juice or soda.  No milk or cream please. You may take your regular medication, including pain medications, with a sip of water before your appointment  Diabetics should hold regular insulin (if taken separately) and take 1/2 normal NPH dos the morning of the procedure.  Carry some sugar containing items with you to your appointment. A driver must accompany you and be prepared to drive you home after your procedure.  Bring all your current medications with your. An IV may be inserted and sedation may be given at the discretion of the physician.     A blood pressure cuff, EKG and other monitors will often be applied during the procedure.  Some patients may need to have extra oxygen administered for a short period. You will be asked to provide medical information, including your allergies, prior to the procedure.  We must know immediately if you are taking blood thinners (like Coumadin/Warfarin)  Or if you are allergic to IV iodine contrast (dye). We must know if you could possible be  pregnant.  Possible side-effects: Bleeding from needle site Infection (rare, may require surgery) Nerve injury (rare) Numbness & tingling (temporary) Difficulty urinating (rare, temporary) Spinal headache ( a headache worse with upright posture) Light -headedness (temporary) Pain at injection site (several days) Decreased blood pressure (temporary) Weakness in arm/leg (temporary) Pressure sensation in back/neck (temporary)  Call if you experience: Fever/chills associated with headache or increased back/neck pain. Headache worsened by an upright position. New onset weakness or numbness of an extremity below the injection site Hives or difficulty breathing (go to the emergency room) Inflammation or drainage at the infection site Severe back/neck pain Any new symptoms which are concerning to you  Please note:  Although the local anesthetic injected can often make your back or neck feel good for several hours after the injection, the pain will likely return.  It takes 3-7 days for steroids to work in the epidural space.  You may not notice any pain relief for at least that one week.  If effective, we will often do a series of three injections spaced 3-6 weeks apart to maximally decrease your pain.  After the initial series, we generally will wait several months before considering a repeat injection of the same type.  If you have any questions, please call [phone] [facility] 

## 2022-08-21 ENCOUNTER — Telehealth: Payer: Self-pay | Admitting: *Deleted

## 2022-08-21 NOTE — Telephone Encounter (Signed)
No problems post procedure. 

## 2022-08-22 ENCOUNTER — Encounter: Payer: Self-pay | Admitting: Anesthesiology

## 2022-08-22 NOTE — Progress Notes (Signed)
Subjective:  Patient ID: Lindsey Carson, female    DOB: Feb 04, 1966  Age: 56 y.o. MRN: [id number]  CC: Back Pain (lower)   Procedure: L4-L5 lumbar epidural steroid under fluoroscopic guidance with no sedation  HPI Lindsey Carson presents for reevaluation.  She was last seen several years ago and had an epidural for her low back pain.  She has since had recurrence of similar quality pain.  This is reported in the low back with radiation into both of her lower extremities with calf cramping and pain with prolonged standing.  In the past she has been able to control this with over-the-counter medication management.  A few years ago she had an epidural to help with her low back pain following a previous back surgery.  This gave her good relief lasting 6 months to year with significant improvement and she has generally been able to keep the pain under control with stretching and anti-inflammatories until recently.  She presents today secondary to failed physical therapy and conservative management requesting a repeat epidural.  No change in lower extremity strength function or bowel or bladder function is noted.  Outpatient Medications Prior to Visit  Medication Sig Dispense Refill   Abaloparatide (TYMLOS) 3120 MCG/1.56ML SOPN INJECT 1 PEN (80 MCG) UNDER THE SKIN ONCE DAILY AS DIRECTED 1.56 mL 5   aspirin EC 81 MG tablet Take 1 tablet (81 mg total) by mouth daily. (Patient taking differently: Take 81 mg by mouth at bedtime.) 90 tablet 3   cetirizine (ZYRTEC) 10 MG tablet Take 10 mg by mouth at bedtime.      Cholecalciferol (VITAMIN D) 2000 units CAPS Take by mouth at bedtime.     Insulin Pen Needle 32G X 4 MM MISC Use as directed 100 each 11   levothyroxine (SYNTHROID) 100 MCG tablet 1 tablet in the morning on an empty stomach Orally Once a day 90 days 90 tablet 3   Magnesium 250 MG TABS Take 250 mg by mouth at bedtime.     metoprolol succinate (TOPROL-XL) 25 MG 24 hr tablet TAKE 1 TABLET BY MOUTH ONCE A  DAY 30 tablet 12   nortriptyline (PAMELOR) 10 MG capsule TAKE 1 CAPSULE BY MOUTH AT BEDTIME. 90 capsule 1   triamcinolone (NASACORT) 55 MCG/ACT nasal inhaler Place 1 spray into the nose daily.      azelastine (ASTELIN) 0.1 % nasal spray Place 2 sprays into both nostrils 2 (two) times daily. Use in each nostril as directed 30 mL 12   hydrOXYzine (ATARAX) 10 MG tablet Take 1 tablet (10 mg total) by mouth 3 (three) times daily as needed. 30 tablet 0   No facility-administered medications prior to visit.    Review of Systems CNS: No confusion or sedation Cardiac: No angina or palpitations GI: No abdominal pain or constipation Constitutional: No nausea vomiting fevers or chills  Objective:  BP 138/84   Pulse 84   Temp (!) 97.5 F (36.4 C) (Temporal)   Resp 16   Ht '5\' 3"'$  (1.6 m)   Wt 170 lb (77.1 kg)   SpO2 95%   BMI 30.11 kg/m    BP Readings from Last 3 Encounters:  08/20/22 138/84  08/06/22 138/85  07/27/22 110/70     Wt Readings from Last 3 Encounters:  08/20/22 170 lb (77.1 kg)  08/06/22 173 lb (78.5 kg)  07/27/22 172 lb 3.2 oz (78.1 kg)     Physical Exam Pt is alert and oriented PERRL EOMI HEART IS RRR  no murmur or rub LCTA no wheezing or rales MUSCULOSKELETAL reveals some paraspinous muscle tenderness but no overt trigger points in the lumbosacral region.  She does have a positive straight leg raise on the right side negative on the left.  Muscle tone and bulk is good with no evidence of any sensory deficiency.  Labs  Lab Results  Component Value Date   HGBA1C 6.2 04/09/2022   HGBA1C 5.4 06/27/2017   HGBA1C 5.5 12/29/2015   Lab Results  Component Value Date   LDLCALC 125 (H) 04/06/2019   CREATININE 0.90 07/27/2022    -------------------------------------------------------------------------------------------------------------------- Lab Results  Component Value Date   WBC 5.3 04/06/2019   HGB 14.3 04/06/2019   HCT 40.6 04/06/2019   PLT 158.0  04/06/2019   GLUCOSE 101 (H) 07/27/2022   CHOL 235 (H) 04/09/2022   TRIG 248.0 (H) 04/09/2022   HDL 38.80 (L) 04/09/2022   LDLDIRECT 160.0 04/09/2022   LDLCALC 125 (H) 04/06/2019   ALT 25 07/27/2022   AST 20 07/27/2022   NA 141 07/27/2022   K 4.2 07/27/2022   CL 107 07/27/2022   CREATININE 0.90 07/27/2022   BUN 15 07/27/2022   CO2 26 07/27/2022   TSH 3.75 04/06/2019   HGBA1C 6.2 04/09/2022    --------------------------------------------------------------------------------------------------------------------- DG PAIN CLINIC C-ARM 1-60 MIN NO REPORT  Result Date: 08/20/2022 Fluoro was used, but no Radiologist interpretation will be provided. Please refer to "NOTES" tab for provider progress note.    Assessment & Plan:   Lindsey Carson was seen today for back pain.  Diagnoses and all orders for this visit:  Lumbar disc herniation with radiculopathy  DDD (degenerative disc disease), lumbar -     Lumbar Epidural Injection -     triamcinolone acetonide (KENALOG-40) injection 40 mg -     sodium chloride flush (NS) 0.9 % injection 10 mL -     ropivacaine (PF) 2 mg/mL (0.2%) (NAROPIN) injection 10 mL -     lidocaine (PF) (XYLOCAINE) 1 % injection 5 mL -     iohexol (OMNIPAQUE) 180 MG/ML injection 10 mL  Bilateral sciatica -     Lumbar Epidural Injection -     triamcinolone acetonide (KENALOG-40) injection 40 mg -     sodium chloride flush (NS) 0.9 % injection 10 mL -     ropivacaine (PF) 2 mg/mL (0.2%) (NAROPIN) injection 10 mL -     lidocaine (PF) (XYLOCAINE) 1 % injection 5 mL -     iohexol (OMNIPAQUE) 180 MG/ML injection 10 mL  Chronic bilateral low back pain with bilateral sciatica  Facet arthropathy, lumbar  Previous back surgery  Chronic bilateral low back pain with right-sided sciatica        ----------------------------------------------------------------------------------------------------------------------  Problem List Items Addressed This Visit        Unprioritized   Chronic bilateral low back pain with bilateral sciatica   Chronic low back pain   DDD (degenerative disc disease), lumbar   Facet arthropathy, lumbar   Lumbar disc herniation with radiculopathy - Primary   Previous back surgery   Other Visit Diagnoses     Bilateral sciatica       Relevant Medications   triamcinolone acetonide (KENALOG-40) injection 40 mg (Completed)   sodium chloride flush (NS) 0.9 % injection 10 mL (Completed)   ropivacaine (PF) 2 mg/mL (0.2%) (NAROPIN) injection 10 mL (Completed)   lidocaine (PF) (XYLOCAINE) 1 % injection 5 mL (Completed)   iohexol (OMNIPAQUE) 180 MG/ML injection 10 mL (Completed)         ----------------------------------------------------------------------------------------------------------------------  1. DDD (degenerative disc disease), lumbar We will proceed with a repeat epidural today.  The risks and benefits of the procedure with her in full detail all questions were answered.  Continue core stretching strengthening exercises with return to clinic scheduled in 1 month for repeat evaluation possible repeat injection. - Lumbar Epidural Injection - triamcinolone acetonide (KENALOG-40) injection 40 mg - sodium chloride flush (NS) 0.9 % injection 10 mL - ropivacaine (PF) 2 mg/mL (0.2%) (NAROPIN) injection 10 mL - lidocaine (PF) (XYLOCAINE) 1 % injection 5 mL - iohexol (OMNIPAQUE) 180 MG/ML injection 10 mL  2. Bilateral sciatica As above - Lumbar Epidural Injection - triamcinolone acetonide (KENALOG-40) injection 40 mg - sodium chloride flush (NS) 0.9 % injection 10 mL - ropivacaine (PF) 2 mg/mL (0.2%) (NAROPIN) injection 10 mL - lidocaine (PF) (XYLOCAINE) 1 % injection 5 mL - iohexol (OMNIPAQUE) 180 MG/ML injection 10 mL  3. Lumbar disc herniation with radiculopathy As above  4. Chronic bilateral low back pain with bilateral sciatica   5. Facet arthropathy, lumbar   6. Previous back surgery   7. Chronic  bilateral low back pain with right-sided sciatica As above    ----------------------------------------------------------------------------------------------------------------------  I am having Lindsey Carson maintain her triamcinolone, cetirizine, aspirin EC, Magnesium, Vitamin D, metoprolol succinate, Tymlos, hydrOXYzine, azelastine, Insulin Pen Needle, nortriptyline, and levothyroxine. We administered triamcinolone acetonide, sodium chloride flush, ropivacaine (PF) 2 mg/mL (0.2%), lidocaine (PF), and iohexol.   Meds ordered this encounter  Medications   triamcinolone acetonide (KENALOG-40) injection 40 mg   sodium chloride flush (NS) 0.9 % injection 10 mL   ropivacaine (PF) 2 mg/mL (0.2%) (NAROPIN) injection 10 mL   lidocaine (PF) (XYLOCAINE) 1 % injection 5 mL   iohexol (OMNIPAQUE) 180 MG/ML injection 10 mL   Patient's Medications  New Prescriptions   No medications on file  Previous Medications   ABALOPARATIDE (TYMLOS) 3120 MCG/1.56ML SOPN    INJECT 1 PEN (80 MCG) UNDER THE SKIN ONCE DAILY AS DIRECTED   ASPIRIN EC 81 MG TABLET    Take 1 tablet (81 mg total) by mouth daily.   AZELASTINE (ASTELIN) 0.1 % NASAL SPRAY    Place 2 sprays into both nostrils 2 (two) times daily. Use in each nostril as directed   CETIRIZINE (ZYRTEC) 10 MG TABLET    Take 10 mg by mouth at bedtime.    CHOLECALCIFEROL (VITAMIN D) 2000 UNITS CAPS    Take by mouth at bedtime.   HYDROXYZINE (ATARAX) 10 MG TABLET    Take 1 tablet (10 mg total) by mouth 3 (three) times daily as needed.   INSULIN PEN NEEDLE 32G X 4 MM MISC    Use as directed   LEVOTHYROXINE (SYNTHROID) 100 MCG TABLET    1 tablet in the morning on an empty stomach Orally Once a day 90 days   MAGNESIUM 250 MG TABS    Take 250 mg by mouth at bedtime.   METOPROLOL SUCCINATE (TOPROL-XL) 25 MG 24 HR TABLET    TAKE 1 TABLET BY MOUTH ONCE A DAY   NORTRIPTYLINE (PAMELOR) 10 MG CAPSULE    TAKE 1 CAPSULE BY MOUTH AT BEDTIME.   TRIAMCINOLONE (NASACORT) 55  MCG/ACT NASAL INHALER    Place 1 spray into the nose daily.   Modified Medications   No medications on file  Discontinued Medications   No medications on file   ----------------------------------------------------------------------------------------------------------------------  Follow-up: Return in about 1 month (around 09/20/2022) for evaluation, procedure.   Procedure: L4-L5 LESI with fluoroscopic guidance  and no moderate sedation  NOTE: The risks, benefits, and expectations of the procedure have been discussed and explained to the patient who was understanding and in agreement with suggested treatment plan. No guarantees were made.  DESCRIPTION OF PROCEDURE: Lumbar epidural steroid injection with no IV Versed, EKG, blood pressure, pulse, and pulse oximetry monitoring. The procedure was performed with the patient in the prone position under fluoroscopic guidance.  Sterile prep x3 was initiated and I then injected subcutaneous lidocaine to the overlying L L4-L5 site after its fluoroscopic identifictation.  Using strict aseptic technique, I then advanced an 18-gauge Tuohy epidural needle in the midline using interlaminar approach via loss-of-resistance to saline technique. There was negative aspiration for heme or  CSF.  I then confirmed position with both AP and Lateral fluoroscan.  2 cc of contrast dye were injected and a  total of 5 mL of Preservative-Free normal saline mixed with 40 mg of Kenalog and 1cc Ropicaine 0.2 percent were injected incrementally via the  epidurally placed needle. The needle was removed. The patient tolerated the injection well and was convalesced and discharged to home in stable condition. Should the patient have any post procedure difficulty they have been instructed on how to contact us for assistance.    Molli Barrows, MD

## 2022-08-29 ENCOUNTER — Encounter: Payer: Self-pay | Admitting: Internal Medicine

## 2022-09-04 ENCOUNTER — Other Ambulatory Visit (HOSPITAL_COMMUNITY): Payer: Self-pay

## 2022-09-06 ENCOUNTER — Other Ambulatory Visit (HOSPITAL_COMMUNITY): Payer: Self-pay

## 2022-09-11 ENCOUNTER — Other Ambulatory Visit (HOSPITAL_COMMUNITY): Payer: Self-pay

## 2022-09-12 ENCOUNTER — Other Ambulatory Visit (HOSPITAL_COMMUNITY): Payer: Self-pay

## 2022-09-13 ENCOUNTER — Other Ambulatory Visit (HOSPITAL_COMMUNITY): Payer: Self-pay

## 2022-09-14 ENCOUNTER — Other Ambulatory Visit (HOSPITAL_COMMUNITY): Payer: Self-pay

## 2022-09-20 ENCOUNTER — Other Ambulatory Visit: Payer: Self-pay

## 2022-09-20 DIAGNOSIS — E782 Mixed hyperlipidemia: Secondary | ICD-10-CM | POA: Diagnosis not present

## 2022-09-20 DIAGNOSIS — I10 Essential (primary) hypertension: Secondary | ICD-10-CM | POA: Diagnosis not present

## 2022-10-05 ENCOUNTER — Other Ambulatory Visit (HOSPITAL_COMMUNITY): Payer: Self-pay

## 2022-10-18 ENCOUNTER — Other Ambulatory Visit: Payer: Self-pay

## 2022-10-26 ENCOUNTER — Encounter: Payer: Self-pay | Admitting: Family Medicine

## 2022-10-26 ENCOUNTER — Ambulatory Visit: Payer: 59 | Admitting: Family Medicine

## 2022-10-26 VITALS — BP 120/80 | HR 83 | Temp 98.0°F | Ht 63.0 in | Wt 171.0 lb

## 2022-10-26 DIAGNOSIS — I10 Essential (primary) hypertension: Secondary | ICD-10-CM | POA: Diagnosis not present

## 2022-10-26 DIAGNOSIS — E669 Obesity, unspecified: Secondary | ICD-10-CM | POA: Diagnosis not present

## 2022-10-26 DIAGNOSIS — M79661 Pain in right lower leg: Secondary | ICD-10-CM | POA: Insufficient documentation

## 2022-10-26 DIAGNOSIS — K1379 Other lesions of oral mucosa: Secondary | ICD-10-CM | POA: Insufficient documentation

## 2022-10-26 DIAGNOSIS — M5441 Lumbago with sciatica, right side: Secondary | ICD-10-CM

## 2022-10-26 DIAGNOSIS — G8929 Other chronic pain: Secondary | ICD-10-CM | POA: Diagnosis not present

## 2022-10-26 DIAGNOSIS — M5442 Lumbago with sciatica, left side: Secondary | ICD-10-CM

## 2022-10-26 DIAGNOSIS — E039 Hypothyroidism, unspecified: Secondary | ICD-10-CM

## 2022-10-26 NOTE — Assessment & Plan Note (Signed)
Chronic issue.  Her right leg symptoms could be nerve impingement related though given her tenderness on exam we will check a D-dimer.  Discussed potential for getting an ultrasound if the D-dimer is positive.

## 2022-10-26 NOTE — Assessment & Plan Note (Signed)
Chronic issue.  I encouraged diet and exercise.

## 2022-10-26 NOTE — Progress Notes (Signed)
Tommi Rumps, MD Phone: [phone]  Lindsey Carson is a 56 y.o. female who presents today for follow-up.  Obesity: Patient has not made any dietary changes yet.  She has not been exercising related to some back issues she has had.  Hypothyroidism: She stopped her Synthroid in the middle of November due to her TSH being significantly low.  She reports her endocrinologist advised to take 50 mcg 1 day a week though she just discontinued the Synthroid.  Hypertension: Taking metoprolol.  No chest pain or shortness of breath.  Chronic back pain: Patient reports she has had an injection which did help with some discomfort radiating into her left leg.  She has continued to have some shooting pain in her right leg particularly in the calf if she stands up for too long or if she crosses her legs wrong.  She tried support hose though that did not help.  She notes it feels like a charley horse in her calf though there is no cramp and the discomfort is more persistent.  She did travel to [place] back in October though this started sometime after that trip.  Mouth burn: Patient notes she drinks very hot cocoa and burned the roof of her mouth in the posterior aspect of her mouth 5 days ago.  This has been improving.  There is some soreness.  Social History   Tobacco Use  Smoking Status Never  Smokeless Tobacco Never    Current Outpatient Medications on File Prior to Visit  Medication Sig Dispense Refill   aspirin EC 81 MG tablet Take 1 tablet (81 mg total) by mouth daily. (Patient taking differently: Take 81 mg by mouth at bedtime.) 90 tablet 3   cetirizine (ZYRTEC) 10 MG tablet Take 10 mg by mouth at bedtime.      Cholecalciferol (VITAMIN D) 2000 units CAPS Take by mouth at bedtime.     hydrOXYzine (ATARAX) 10 MG tablet Take 1 tablet (10 mg total) by mouth 3 (three) times daily as needed. 30 tablet 0   Insulin Pen Needle 32G X 4 MM MISC Use as directed 100 each 11   Magnesium 250 MG TABS Take 250  mg by mouth at bedtime.     metoprolol succinate (TOPROL-XL) 25 MG 24 hr tablet TAKE 1 TABLET BY MOUTH ONCE A DAY 30 tablet 12   nortriptyline (PAMELOR) 10 MG capsule TAKE 1 CAPSULE BY MOUTH AT BEDTIME. 90 capsule 1   triamcinolone (NASACORT) 55 MCG/ACT nasal inhaler Place 1 spray into the nose daily.      levothyroxine (SYNTHROID) 100 MCG tablet 1 tablet in the morning on an empty stomach Orally Once a day 90 days 90 tablet 3   No current facility-administered medications on file prior to visit.     ROS see history of present illness  Objective  Physical Exam Vitals:   10/26/22 0900  BP: 120/80  Pulse: 83  Temp: 98 F (36.7 C)  SpO2: 96%    BP Readings from Last 3 Encounters:  10/26/22 120/80  08/20/22 138/84  08/06/22 138/85   Wt Readings from Last 3 Encounters:  10/26/22 171 lb (77.6 kg)  08/20/22 170 lb (77.1 kg)  08/06/22 173 lb (78.5 kg)    Physical Exam Constitutional:      General: She is not in acute distress.    Appearance: She is not diaphoretic.  HENT:     Mouth/Throat:     Comments: Shallow ulceration on her palate, no signs of infection Cardiovascular:  Rate and Rhythm: Normal rate and regular rhythm.     Heart sounds: Normal heart sounds.  Pulmonary:     Effort: Pulmonary effort is normal.     Breath sounds: Normal breath sounds.  Musculoskeletal:     Right lower leg: No edema.     Left lower leg: No edema.     Comments: Some right calf tenderness with no cords palpated, there is discomfort in her right calf on Homans testing, negative Homans on the left, no left calf tenderness  Skin:    General: Skin is warm and dry.  Neurological:     Mental Status: She is alert.      Assessment/Plan: Please see individual problem list.  Primary hypertension Assessment & Plan: Chronic issue.  Adequately controlled.  She will continue metoprolol 25 mg daily.   Hypothyroidism, unspecified type Assessment & Plan: Chronic issue.  Will check TSH  given that she is been off of her Synthroid for about 6 weeks.  Orders: -     TSH  Chronic bilateral low back pain with bilateral sciatica Assessment & Plan: Chronic issue.  Her right leg symptoms could be nerve impingement related though given her tenderness on exam we will check a D-dimer.  Discussed potential for getting an ultrasound if the D-dimer is positive.   Obesity (BMI 30.0-34.9) Assessment & Plan: Chronic issue.  I encouraged diet and exercise.   Right calf pain Assessment & Plan: I suspect this this is related to nerve impingement in her back though given her tenderness we will check a D-dimer.  Discussed with positive we will have to proceed with an ultrasound.  Orders: -     D-dimer, quantitative  Mouth sores Assessment & Plan: Patient burned the inside of her mouth with hot cocoa.  Lesions are improving some.  Discussed she could try over-the-counter oral pain relief spray to see if that would be beneficial.     Return in about 3 months (around 01/25/2023) for Weight follow-up.   Tommi Rumps, MD [place]

## 2022-10-26 NOTE — Assessment & Plan Note (Signed)
I suspect this this is related to nerve impingement in her back though given her tenderness we will check a D-dimer.  Discussed with positive we will have to proceed with an ultrasound.

## 2022-10-26 NOTE — Assessment & Plan Note (Signed)
Patient burned the inside of her mouth with hot cocoa.  Lesions are improving some.  Discussed she could try over-the-counter oral pain relief spray to see if that would be beneficial.

## 2022-10-26 NOTE — Patient Instructions (Signed)
Nice to see you. Please try to work on diet and exercise in the new year. We will contact you with your lab results. You can try oral over-the-counter pain relief spray to see if that would help with your discomfort in your mouth.  The ulceration should improve in the next week or so.

## 2022-10-26 NOTE — Assessment & Plan Note (Signed)
Chronic issue.  Adequately controlled.  She will continue metoprolol 25 mg daily.

## 2022-10-26 NOTE — Assessment & Plan Note (Signed)
Chronic issue.  Will check TSH given that she is been off of her Synthroid for about 6 weeks.

## 2022-10-27 LAB — TSH: TSH: 5.92 u[IU]/mL — ABNORMAL HIGH (ref 0.35–5.50)

## 2022-10-27 LAB — D-DIMER, QUANTITATIVE: D-Dimer, Quant: 0.2 mcg/mL FEU (ref <0.50)

## 2022-11-12 ENCOUNTER — Ambulatory Visit: Payer: 59 | Attending: Family Medicine | Admitting: Pharmacist

## 2022-11-12 ENCOUNTER — Telehealth: Payer: Self-pay | Admitting: Pharmacist

## 2022-11-12 ENCOUNTER — Other Ambulatory Visit (HOSPITAL_COMMUNITY): Payer: Self-pay

## 2022-11-12 DIAGNOSIS — Z79899 Other long term (current) drug therapy: Secondary | ICD-10-CM

## 2022-11-12 MED ORDER — TYMLOS 3120 MCG/1.56ML ~~LOC~~ SOPN
PEN_INJECTOR | SUBCUTANEOUS | 12 refills | Status: DC
  Filled 2022-11-12: qty 1.56, 28d supply, fill #0
  Filled 2022-12-04: qty 1.56, 28d supply, fill #1
  Filled 2022-12-31: qty 1.56, 28d supply, fill #2
  Filled 2023-02-20: qty 1.56, 28d supply, fill #3
  Filled 2023-03-20: qty 1.56, 28d supply, fill #4
  Filled 2023-04-16: qty 1.56, 28d supply, fill #5
  Filled 2023-05-14: qty 1.56, 28d supply, fill #6
  Filled 2023-06-17: qty 1.56, 28d supply, fill #7
  Filled 2023-07-15: qty 1.56, 28d supply, fill #8

## 2022-11-12 MED ORDER — TYMLOS 3120 MCG/1.56ML ~~LOC~~ SOPN: PEN_INJECTOR | SUBCUTANEOUS | 12 refills | Status: DC

## 2022-11-12 NOTE — Progress Notes (Signed)
   S: Patient presents today for review of their specialty medication.   Patient is currently taking Tymlos for osteoporosis. Patient is managed by Dr. Forde Dandy for this.   Dosing: 80 mcg subq once daily  Adherence: confirmed  Efficacy: pt reports this is working okay for her  Monitoring:  - Pt w/ hx of tachycardia that starts after injecting Tymlos. She tells me that this has improved since changing her BP medication. - Orthostatic hypotension: none reported - Serum calcium: last level in Epic WNL - BMD: monitored by Dr. Forde Dandy    Current adverse effects: - No additional side effects reported other than tachycardia described above   O:  Lab Results  Component Value Date   WBC 5.3 04/06/2019   HGB 14.3 04/06/2019   HCT 40.6 04/06/2019   MCV 88.9 04/06/2019   PLT 158.0 04/06/2019      Chemistry      Component Value Date/Time   NA 141 07/27/2022 0958   K 4.2 07/27/2022 0958   CL 107 07/27/2022 0958   CO2 26 07/27/2022 0958   BUN 15 07/27/2022 0958   CREATININE 0.90 07/27/2022 0958      Component Value Date/Time   CALCIUM 9.4 07/27/2022 0958   ALKPHOS 106 07/27/2022 0958   AST 20 07/27/2022 0958   ALT 25 07/27/2022 0958   BILITOT 0.4 07/27/2022 0958       A/P: 1. Medication review: patient currently on Tymlos for osteoporosis. Reviewed the medication with the patient. She does not report any new side effects. She has no additional questions or concerns for me at this time. Benard Halsted, PharmD, [place], [place] [phone]

## 2022-11-12 NOTE — Telephone Encounter (Signed)
Called patient to schedule an appointment for the  [facility]. I was unable to reach the patient so I left a HIPAA-compliant message requesting that the patient return my call.   Luke Van Ausdall, PharmD, BCACP, CPP Clinical Pharmacist [facility] [phone]  

## 2022-11-13 ENCOUNTER — Other Ambulatory Visit (HOSPITAL_COMMUNITY): Payer: Self-pay

## 2022-11-13 ENCOUNTER — Other Ambulatory Visit: Payer: Self-pay

## 2022-11-15 ENCOUNTER — Other Ambulatory Visit: Payer: Self-pay | Admitting: Family Medicine

## 2022-11-15 ENCOUNTER — Other Ambulatory Visit: Payer: Self-pay

## 2022-11-16 ENCOUNTER — Other Ambulatory Visit: Payer: Self-pay

## 2022-11-16 ENCOUNTER — Other Ambulatory Visit: Payer: Self-pay | Admitting: Family Medicine

## 2022-11-16 MED FILL — Nortriptyline HCl Cap 10 MG: ORAL | 90 days supply | Qty: 90 | Fill #0 | Status: AC

## 2022-11-19 ENCOUNTER — Other Ambulatory Visit: Payer: Self-pay

## 2022-12-04 ENCOUNTER — Other Ambulatory Visit: Payer: Self-pay

## 2022-12-04 ENCOUNTER — Other Ambulatory Visit (HOSPITAL_COMMUNITY): Payer: Self-pay

## 2022-12-06 ENCOUNTER — Other Ambulatory Visit (HOSPITAL_COMMUNITY): Payer: Self-pay

## 2022-12-17 ENCOUNTER — Other Ambulatory Visit: Payer: Self-pay

## 2022-12-27 ENCOUNTER — Ambulatory Visit
Admission: EM | Admit: 2022-12-27 | Discharge: 2022-12-27 | Disposition: A | Discharge status: Home / Self Care | Payer: 59 | Attending: Urgent Care | Admitting: Urgent Care

## 2022-12-27 ENCOUNTER — Other Ambulatory Visit (HOSPITAL_COMMUNITY): Payer: Self-pay

## 2022-12-27 DIAGNOSIS — M795 Residual foreign body in soft tissue: Secondary | ICD-10-CM

## 2022-12-27 NOTE — ED Triage Notes (Signed)
Pt states 12/11/22 she stepped on something in her garage. The patient states she still has tenderness to the bottom of left foot and does not know if there is still a foreign object in the foot.

## 2022-12-27 NOTE — Discharge Instructions (Signed)
Follow up here or with your primary care provider if your symptoms are worsening or not improving.     

## 2022-12-27 NOTE — ED Provider Notes (Signed)
Roderic Palau    CSN: [id number] Arrival date & time: 12/27/22  1549      History   Chief Complaint Chief Complaint  Patient presents with   Foot Pain    HPI Lindsey Carson is a 57 y.o. female.    Foot Pain   Presents to urgent care with complaint of tenderness to the bottom of her left foot.  Patient states she "stepped on something in her garage" on 12/11/2022 (16 days) and is concerned for possible foreign body.   Past Medical History:  Diagnosis Date   Allergy    Arthritis    OA   Chronic allergic rhinitis    Severe, year long   Chronic back pain    HNP - history   Chronic low back pain 01/28/2020   Elevated blood pressure    History of   Facet arthropathy, lumbar 01/28/2020   Headache    Heart murmur    was told but doesn't require meds - never has been a problem   Hemorrhoids    History OF   Hypertension    OFF BP MEDS FOR 6 MONTHS   Joint pain    hands and knees, hips and shoulders   PONV (postoperative nausea and vomiting)    Previous back surgery 01/28/2020   Sciatica of right side 01/28/2020   Seasonal allergies    uses nasocort daily and zyrtec   Sinus infection    History of frequent   Spinal fracture YRS AGO   L5/S1    Patient Active Problem List   Diagnosis Date Noted   Mouth sores 10/26/2022   Right calf pain 10/26/2022   Elevated alkaline phosphatase level 07/27/2022   Hyperlipidemia 07/27/2022   Hypothyroidism 07/27/2022   Family history of hemochromatosis 07/27/2022   Obesity (BMI 30.0-34.9) 04/09/2022   Anxiety and depression 06/09/2021   Osteoporosis 05/23/2021   DDD (degenerative disc disease), lumbar 01/28/2020   Sciatica of right side 01/28/2020   Previous back surgery 01/28/2020   Facet arthropathy, lumbar 01/28/2020   Chronic bilateral low back pain with bilateral sciatica 11/20/2019   Tension headache 04/06/2019   Hyperparathyroidism ([place]) 01/16/2018   Multinodular goiter 01/16/2018   Seborrheic keratosis  06/27/2017   Hypertension 12/27/2016   Exertional dyspnea 12/27/2016   Encounter for general adult medical examination with abnormal findings 12/20/2015   Skin tag 12/20/2015   Lumbar disc herniation with radiculopathy 09/08/2012   Allergic rhinitis 10/27/2010    Past Surgical History:  Procedure Laterality Date   CESAREAN SECTION  1994 and 1998   x 2    COLONSCOPY  08/2017   DILATION AND CURETTAGE OF UTERUS  08/22/2017   LUMBAR LAMINECTOMY/DECOMPRESSION MICRODISCECTOMY  09/08/2012   Procedure: LUMBAR LAMINECTOMY/DECOMPRESSION MICRODISCECTOMY 1 LEVEL;  Surgeon: Charlie Pitter, MD;  Location: [facility];  Service: Neurosurgery;  Laterality: Left;  left Lumbar Five-Sacral one laminectomy/microdiscectomy   PARATHYROIDECTOMY Left 09/04/2018   Procedure: LEFT INFERIOR PARATHYROIDECTOMY WITH  NECK EXPLORATION;  Surgeon: Armandina Gemma, MD;  Location: [facility];  Service: General;  Laterality: Left;   [place]    OB History   No obstetric history on file.      Home Medications    Prior to Admission medications   Medication Sig Start Date End Date Taking? Authorizing Provider  Abaloparatide (TYMLOS) 3120 MCG/1.56ML SOPN INJECT 1 PEN (80 MCG) UNDER THE SKIN ONCE DAILY AS DIRECTED 11/12/22   Tresa Garter, MD  aspirin EC 81 MG  tablet Take 1 tablet (81 mg total) by mouth daily. Patient taking differently: Take 81 mg by mouth at bedtime. 01/10/17   Wende Bushy, MD  cetirizine (ZYRTEC) 10 MG tablet Take 10 mg by mouth at bedtime.     [provider]  Cholecalciferol (VITAMIN D) 2000 units CAPS Take by mouth at bedtime.    [provider]  hydrOXYzine (ATARAX) 10 MG tablet Take 1 tablet (10 mg total) by mouth 3 (three) times daily as needed. 04/09/22   Leone Haven, MD  Insulin Pen Needle 32G X 4 MM MISC Use as directed 05/17/22     Magnesium 250 MG TABS Take 250 mg by mouth at bedtime.    [provider]  metoprolol succinate (TOPROL-XL) 25  MG 24 hr tablet TAKE 1 TABLET BY MOUTH ONCE A DAY 04/05/22     nortriptyline (PAMELOR) 10 MG capsule Take 1 capsule (10 mg total) by mouth at bedtime. 11/16/22 11/16/23  Leone Haven, MD  triamcinolone (NASACORT) 55 MCG/ACT nasal inhaler Place 1 spray into the nose daily.     [provider]    Family History Family History  Problem Relation Age of Onset   Diabetes Mother    Hypertension Mother    Hypothyroidism Mother    Heart attack Father        MI   Coronary artery disease Father    Diabetes Father    Hemachromatosis Father    Diabetes Brother    Hemachromatosis Brother    Colon cancer Maternal Uncle    Colon cancer Maternal Grandfather     Social History Social History   Tobacco Use   Smoking status: Never   Smokeless tobacco: Never  Vaping Use   Vaping Use: Never used  Substance Use Topics   Alcohol use: No    Alcohol/week: 0.0 standard drinks of alcohol   Drug use: No     Allergies   Augmentin [amoxicillin-pot clavulanate], Lisinopril, and Latex   Review of Systems Review of Systems   Physical Exam Triage Vital Signs ED Triage Vitals  Enc Vitals Group     BP 12/27/22 1558 (!) 146/96     Pulse Rate 12/27/22 1558 78     Resp 12/27/22 1558 16     Temp 12/27/22 1558 98.1 F (36.7 C)     Temp Source 12/27/22 1558 Oral     SpO2 12/27/22 1558 96 %     Weight --      Height --      Head Circumference --      Peak Flow --      Pain Score 12/27/22 1556 8     Pain Loc --      Pain Edu? --      Excl. in [place]? --    No data found.  Updated Vital Signs BP (!) 146/96 (BP Location: Left Arm)   Pulse 78   Temp 98.1 F (36.7 C) (Oral)   Resp 16   SpO2 96%   Visual Acuity Right Eye Distance:   Left Eye Distance:   Bilateral Distance:    Right Eye Near:   Left Eye Near:    Bilateral Near:     Physical Exam Vitals reviewed.  Constitutional:      Appearance: Normal appearance.  Musculoskeletal:       Feet:  Skin:    General: Skin  is warm and dry.  Neurological:     General: No focal deficit present.  Mental Status: She is alert and oriented to person, place, and time.  Psychiatric:        Mood and Affect: Mood normal.        Behavior: Behavior normal.      UC Treatments / Results  Labs (all labs ordered are listed, but only abnormal results are displayed) Labs Reviewed - No data to display  EKG   Radiology No results found.  Procedures Foreign Body Removal  Date/Time: 12/27/2022 4:30 PM  Performed by: Rose Phi, FNP Authorized by: Rose Phi, FNP   Consent:    Consent obtained:  Verbal   Consent given by:  Patient   Risks, benefits, and alternatives were discussed: yes     Risks discussed:  Pain and incomplete removal   Alternatives discussed:  No treatment Universal protocol:    Procedure explained and questions answered to patient or proxy's satisfaction: yes     Relevant documents present and verified: yes     Test results available: no     Imaging studies available: no     Required blood products, implants, devices, and special equipment available: no     Site/side marked: no     Immediately prior to procedure, a time out was called: yes     Patient identity confirmed:  Verbally with patient Location:    Location:  Foot   Foot location:  L heel   Depth:  Intradermal   Tendon involvement:  None Pre-procedure details:    Imaging:  None   Neurovascular status: intact   Anesthesia:    Anesthesia method:  Local infiltration   Local anesthetic:  Lidocaine 1% w/o epi Procedure type:    Procedure complexity:  Simple Procedure details:    Incision length:  2 mm   Localization method:  Visualized   Dissection of underlying tissues: no     Bloodless field: no     Removal mechanism:  Forceps   Foreign bodies recovered:  1   Description:  Glass shard, triangular, dark in color, approx 2 mm   Intact foreign body removal: yes   Post-procedure details:    Neurovascular  status: intact     Confirmation:  No additional foreign bodies on visualization   Repair method: dermabond.   Dressing:  Adhesive bandage   Procedure completion:  Tolerated  (including critical care time)  Medications Ordered in UC Medications - No data to display  Initial Impression / Assessment and Plan / UC Course  I have reviewed the triage vital signs and the nursing notes.  Pertinent labs & imaging results that were available during my care of the patient were reviewed by me and considered in my medical decision making (see chart for details).   Foreign body identified, probably glass shard, in L heel. See procedure note.   Final Clinical Impressions(s) / UC Diagnoses   Final diagnoses:  None   Discharge Instructions   None    ED Prescriptions   None    PDMP not reviewed this encounter.   Rose Phi, [place] 12/27/22 [phone]

## 2022-12-31 ENCOUNTER — Other Ambulatory Visit (HOSPITAL_COMMUNITY): Payer: Self-pay

## 2023-01-01 ENCOUNTER — Other Ambulatory Visit (HOSPITAL_COMMUNITY): Payer: Self-pay

## 2023-01-11 ENCOUNTER — Encounter: Payer: Self-pay | Admitting: Internal Medicine

## 2023-01-14 ENCOUNTER — Telehealth: Payer: 59 | Admitting: Nurse Practitioner

## 2023-01-14 ENCOUNTER — Other Ambulatory Visit: Payer: Self-pay

## 2023-01-14 DIAGNOSIS — N3000 Acute cystitis without hematuria: Secondary | ICD-10-CM | POA: Diagnosis not present

## 2023-01-14 MED ORDER — NITROFURANTOIN MONOHYD MACRO 100 MG PO CAPS
100.0000 mg | ORAL_CAPSULE | Freq: Two times a day (BID) | ORAL | 0 refills | Status: AC
  Filled 2023-01-14: qty 10, 5d supply, fill #0

## 2023-01-14 NOTE — Progress Notes (Signed)

## 2023-01-20 ENCOUNTER — Ambulatory Visit
Admission: EM | Admit: 2023-01-20 | Discharge: 2023-01-20 | Disposition: A | Discharge status: Home / Self Care | Payer: 59 | Attending: Internal Medicine | Admitting: Internal Medicine

## 2023-01-20 ENCOUNTER — Ambulatory Visit (INDEPENDENT_AMBULATORY_CARE_PROVIDER_SITE_OTHER): Payer: 59

## 2023-01-20 ENCOUNTER — Telehealth: Payer: 59 | Admitting: Physician Assistant

## 2023-01-20 DIAGNOSIS — R6889 Other general symptoms and signs: Secondary | ICD-10-CM | POA: Insufficient documentation

## 2023-01-20 DIAGNOSIS — J029 Acute pharyngitis, unspecified: Secondary | ICD-10-CM | POA: Diagnosis not present

## 2023-01-20 DIAGNOSIS — Z1152 Encounter for screening for COVID-19: Secondary | ICD-10-CM | POA: Diagnosis not present

## 2023-01-20 DIAGNOSIS — J069 Acute upper respiratory infection, unspecified: Secondary | ICD-10-CM | POA: Insufficient documentation

## 2023-01-20 DIAGNOSIS — R509 Fever, unspecified: Secondary | ICD-10-CM | POA: Diagnosis not present

## 2023-01-20 DIAGNOSIS — R059 Cough, unspecified: Secondary | ICD-10-CM | POA: Diagnosis not present

## 2023-01-20 LAB — POCT INFLUENZA A/B
Influenza A, POC: NEGATIVE
Influenza B, POC: NEGATIVE

## 2023-01-20 NOTE — Discharge Instructions (Signed)
We will call you if the covid test is positive .  °

## 2023-01-20 NOTE — ED Triage Notes (Signed)
Patient presents to UC for cough, sinus pressure, sore throat, chills since Thursday. Fever since yesterday. Taking mucinex and tylenol. Negative covid test 3/15.

## 2023-01-20 NOTE — Progress Notes (Signed)
Because of your extreme fatigue, elevated heart rate and low grade fever, I feel your condition warrants further evaluation and I recommend that you be seen in a face to face visit.   NOTE: There will be NO CHARGE for this eVisit   If you are having a true medical emergency please call 911.      For an urgent face to face visit, [facility] has eight urgent care centers for your convenience:   NEW!! [facility] [place] [facility] [place] Get Driving Directions [id number] [address] [place], [place] [facility] [place] at [place] Get Driving Directions [id number] [place], [place] [address]   [facility] [place] [facility]) Get Driving Directions [id number] [address], [place] [address]  [facility] [place] ([place]) Get Driving Directions [id number] [address] [place],  [place]  [address]  [place] [facility] [place] [facility] - at [address] Get Driving Directions  [phone] [address] [place],  [place] [address]   [facility] Get Driving Directions [id number] [place], [place], [place] [address]   [facility] Get Driving Directions  [id number] [address] [place], [place] [address]   [facility] [place] Get Driving Directions [id number] [address], [place], [place] [address]  Your MyChart E-visit questionnaire answers were reviewed by a board certified advanced clinical practitioner to complete your personal care plan based on your specific symptoms.  Thank you for using e-Visits.    Inda Coke PA-C

## 2023-01-20 NOTE — ED Provider Notes (Addendum)
Roderic Palau    CSN: [id number] Arrival date & time: 01/20/23  1143      History   Chief Complaint Chief Complaint  Patient presents with   Cough   Fever   Chills    HPI Lindsey Carson is a 57 y.o. female who presents with hx of cough, ST, chills, sinus pressure x 3 days. Home Covid test done 3/15 which was negative. Had a fever of  101 yesterday and body aches started yesterday.  Has been taking Mucinex and Tylenol.  Has been fatigued x 6 days, but ended up having a UTI last week.  Has noticed mild wheezing yesterday, but denies SOB.  Has been around a coworker with covid. Pt has never had covid.     Past Medical History:  Diagnosis Date   Allergy    Arthritis    OA   Chronic allergic rhinitis    Severe, year long   Chronic back pain    HNP - history   Chronic low back pain 01/28/2020   Elevated blood pressure    History of   Facet arthropathy, lumbar 01/28/2020   Headache    Heart murmur    was told but doesn't require meds - never has been a problem   Hemorrhoids    History OF   Hypertension    OFF BP MEDS FOR 6 MONTHS   Joint pain    hands and knees, hips and shoulders   PONV (postoperative nausea and vomiting)    Previous back surgery 01/28/2020   Sciatica of right side 01/28/2020   Seasonal allergies    uses nasocort daily and zyrtec   Sinus infection    History of frequent   Spinal fracture YRS AGO   L5/S1    Patient Active Problem List   Diagnosis Date Noted   Mouth sores 10/26/2022   Right calf pain 10/26/2022   Elevated alkaline phosphatase level 07/27/2022   Hyperlipidemia 07/27/2022   Hypothyroidism 07/27/2022   Family history of hemochromatosis 07/27/2022   Obesity (BMI 30.0-34.9) 04/09/2022   Anxiety and depression 06/09/2021   Osteoporosis 05/23/2021   DDD (degenerative disc disease), lumbar 01/28/2020   Sciatica of right side 01/28/2020   Previous back surgery 01/28/2020   Facet arthropathy, lumbar 01/28/2020   Chronic  bilateral low back pain with bilateral sciatica 11/20/2019   Tension headache 04/06/2019   Hyperparathyroidism ([place]) 01/16/2018   Multinodular goiter 01/16/2018   Seborrheic keratosis 06/27/2017   Hypertension 12/27/2016   Exertional dyspnea 12/27/2016   Encounter for general adult medical examination with abnormal findings 12/20/2015   Skin tag 12/20/2015   Lumbar disc herniation with radiculopathy 09/08/2012   Allergic rhinitis 10/27/2010    Past Surgical History:  Procedure Laterality Date   CESAREAN SECTION  1994 and 1998   x 2    COLONSCOPY  08/2017   DILATION AND CURETTAGE OF UTERUS  08/22/2017   LUMBAR LAMINECTOMY/DECOMPRESSION MICRODISCECTOMY  09/08/2012   Procedure: LUMBAR LAMINECTOMY/DECOMPRESSION MICRODISCECTOMY 1 LEVEL;  Surgeon: Charlie Pitter, MD;  Location: [facility];  Service: Neurosurgery;  Laterality: Left;  left Lumbar Five-Sacral one laminectomy/microdiscectomy   PARATHYROIDECTOMY Left 09/04/2018   Procedure: LEFT INFERIOR PARATHYROIDECTOMY WITH  NECK EXPLORATION;  Surgeon: Armandina Gemma, MD;  Location: [facility];  Service: General;  Laterality: Left;   [place]    OB History   No obstetric history on file.      Home Medications    Prior to Admission medications  Medication Sig Start Date End Date Taking? Authorizing Provider  Abaloparatide (TYMLOS) 3120 MCG/1.56ML SOPN INJECT 1 PEN (80 MCG) UNDER THE SKIN ONCE DAILY AS DIRECTED 11/12/22   Tresa Garter, MD  aspirin EC 81 MG tablet Take 1 tablet (81 mg total) by mouth daily. Patient taking differently: Take 81 mg by mouth at bedtime. 01/10/17   Wende Bushy, MD  cetirizine (ZYRTEC) 10 MG tablet Take 10 mg by mouth at bedtime.     [provider]  Cholecalciferol (VITAMIN D) 2000 units CAPS Take by mouth at bedtime.    [provider]  hydrOXYzine (ATARAX) 10 MG tablet Take 1 tablet (10 mg total) by mouth 3 (three) times daily as needed. 04/09/22   Leone Haven,  MD  Insulin Pen Needle 32G X 4 MM MISC Use as directed 05/17/22     Magnesium 250 MG TABS Take 250 mg by mouth at bedtime.    [provider]  metoprolol succinate (TOPROL-XL) 25 MG 24 hr tablet TAKE 1 TABLET BY MOUTH ONCE A DAY 04/05/22     nortriptyline (PAMELOR) 10 MG capsule Take 1 capsule (10 mg total) by mouth at bedtime. 11/16/22 11/16/23  Leone Haven, MD  triamcinolone (NASACORT) 55 MCG/ACT nasal inhaler Place 1 spray into the nose daily.     [provider]    Family History Family History  Problem Relation Age of Onset   Diabetes Mother    Hypertension Mother    Hypothyroidism Mother    Heart attack Father        MI   Coronary artery disease Father    Diabetes Father    Hemachromatosis Father    Diabetes Brother    Hemachromatosis Brother    Colon cancer Maternal Uncle    Colon cancer Maternal Grandfather     Social History Social History   Tobacco Use   Smoking status: Never   Smokeless tobacco: Never  Vaping Use   Vaping Use: Never used  Substance Use Topics   Alcohol use: No    Alcohol/week: 0.0 standard drinks of alcohol   Drug use: No     Allergies   Augmentin [amoxicillin-pot clavulanate], Lisinopril, Prednisone, and Latex   Review of Systems Review of Systems As noted in HPI  Physical Exam Triage Vital Signs ED Triage Vitals [01/20/23 1246]  Enc Vitals Group     BP (!) 174/95     Pulse Rate (!) 103     Resp 18     Temp 97.8 F (36.6 C)     Temp Source Oral     SpO2 95 %     Weight      Height      Head Circumference      Peak Flow      Pain Score      Pain Loc      Pain Edu?      Excl. in [place]?    No data found.  Updated Vital Signs BP (!) 174/95 (BP Location: Left Arm)   Pulse (!) 103   Temp 97.8 F (36.6 C) (Oral)   Resp 18   SpO2 95%  Repeated pulse ox without a mask 97%  Visual Acuity Right Eye Distance:   Left Eye Distance:   Bilateral Distance:    Right Eye Near:   Left Eye Near:    Bilateral  Near:      Physical Exam Vitals signs and nursing note reviewed.  Constitutional:  General: She is not in acute distress.    Appearance: Normal appearance. She is not ill-appearing, toxic-appearing or diaphoretic.  HENT:     Head: Normocephalic.     Right Ear: Tympanic membrane, ear canal and external ear normal.     Left Ear: Tympanic membrane, ear canal and external ear normal.     Nose: external nose is irritated, has clear rhinitis     Mouth/Throat: clear    Mouth: Mucous membranes are moist.  Eyes:     General: No scleral icterus.       Right eye: No discharge.        Left eye: No discharge.     Conjunctiva/sclera: Conjunctivae normal.  Neck:     Musculoskeletal: Neck supple. No neck rigidity.  Cardiovascular:     Rate and Rhythm: Normal rate and regular rhythm.     Heart sounds: No murmur.  Pulmonary:     Effort: Pulmonary effort is normal.     Breath sounds: Normal breath sounds.  AMusculoskeletal: Normal range of motion.  Lymphadenopathy:     Cervical: No cervical adenopathy.  Skin:    General: Skin is warm and dry.     Coloration: Skin is not jaundiced.     Findings: No rash.  Neurological:     Mental Status: She is alert and oriented to person, place, and time.     Gait: Gait normal.  Psychiatric:        Mood and Affect: Mood normal.        Behavior: Behavior normal.        Thought Content: Thought content normal.        Judgment: Judgment normal.    UC Treatments / Results  Labs (all labs ordered are listed, but only abnormal results are displayed) Labs Reviewed  SARS CORONAVIRUS 2 (TAT 6-24 HRS)  POCT INFLUENZA A/B  FLU A&B negative  EKG   Radiology DG Chest 2 View  Result Date: 01/20/2023 CLINICAL DATA:  Cough, fever, sinus pressure, sore throat chills 3 days. EXAM: CHEST - 2 VIEW COMPARISON:  08/28/2012 FINDINGS: Lungs are adequately inflated without focal airspace consolidation or effusion. Cardiomediastinal silhouette and remainder of the  exam is unchanged. IMPRESSION: No active cardiopulmonary disease. Electronically Signed   By: Marin Olp M.D.   On: 01/20/2023 13:35    Procedures Procedures (including critical care time)  Medications Ordered in UC Medications - No data to display  Initial Impression / Assessment and Plan / UC Course  I have reviewed the triage vital signs and the nursing notes.  Pertinent labs & imaging results that were available during my care of the patient were reviewed by me and considered in my medical decision making (see chart for details).  URI Flu like illness  She declined Rx for cough. Has Nasonex at home and will use that for nose congestion. Also advised to do saline nose rinses bid prn.  We will call her with Covid test results if positive.    Final Clinical Impressions(s) / UC Diagnoses   Final diagnoses:  Flu-like symptoms     Discharge Instructions      We will call you if the covid test is positive.      ED Prescriptions   None    PDMP not reviewed this encounter.   Shelby Mattocks, PA-C 01/20/23 1351    Rod[place], [place], [place] 01/20/23 1706

## 2023-01-21 LAB — SARS CORONAVIRUS 2 (TAT 6-24 HRS): SARS Coronavirus 2: NEGATIVE

## 2023-01-24 ENCOUNTER — Other Ambulatory Visit (HOSPITAL_COMMUNITY): Payer: Self-pay

## 2023-01-28 ENCOUNTER — Ambulatory Visit
Admission: RE | Admit: 2023-01-28 | Discharge: 2023-01-28 | Disposition: A | Discharge status: Home / Self Care | Payer: 59 | Source: Ambulatory Visit | Attending: Anesthesiology | Admitting: Anesthesiology

## 2023-01-28 ENCOUNTER — Other Ambulatory Visit (HOSPITAL_COMMUNITY): Payer: Self-pay

## 2023-01-28 ENCOUNTER — Ambulatory Visit (HOSPITAL_BASED_OUTPATIENT_CLINIC_OR_DEPARTMENT_OTHER): Payer: 59 | Admitting: Anesthesiology

## 2023-01-28 ENCOUNTER — Encounter: Payer: Self-pay | Admitting: Anesthesiology

## 2023-01-28 ENCOUNTER — Other Ambulatory Visit: Payer: Self-pay | Admitting: Anesthesiology

## 2023-01-28 VITALS — BP 125/82 | HR 109 | Temp 97.2°F | Resp 16 | Ht 63.0 in | Wt 170.0 lb

## 2023-01-28 DIAGNOSIS — G894 Chronic pain syndrome: Secondary | ICD-10-CM | POA: Insufficient documentation

## 2023-01-28 DIAGNOSIS — M5431 Sciatica, right side: Secondary | ICD-10-CM

## 2023-01-28 DIAGNOSIS — M5441 Lumbago with sciatica, right side: Secondary | ICD-10-CM | POA: Diagnosis not present

## 2023-01-28 DIAGNOSIS — Z9889 Other specified postprocedural states: Secondary | ICD-10-CM | POA: Insufficient documentation

## 2023-01-28 DIAGNOSIS — M5116 Intervertebral disc disorders with radiculopathy, lumbar region: Secondary | ICD-10-CM | POA: Diagnosis not present

## 2023-01-28 DIAGNOSIS — M5442 Lumbago with sciatica, left side: Secondary | ICD-10-CM | POA: Insufficient documentation

## 2023-01-28 DIAGNOSIS — M47816 Spondylosis without myelopathy or radiculopathy, lumbar region: Secondary | ICD-10-CM

## 2023-01-28 DIAGNOSIS — M5432 Sciatica, left side: Secondary | ICD-10-CM

## 2023-01-28 DIAGNOSIS — G8929 Other chronic pain: Secondary | ICD-10-CM | POA: Diagnosis not present

## 2023-01-28 DIAGNOSIS — M5136 Other intervertebral disc degeneration, lumbar region: Secondary | ICD-10-CM

## 2023-01-28 MED ORDER — TRIAMCINOLONE ACETONIDE 40 MG/ML IJ SUSP
INTRAMUSCULAR | Status: AC
  Filled 2023-01-28: qty 1

## 2023-01-28 MED ORDER — SODIUM CHLORIDE 0.9% FLUSH
10.0000 mL | Freq: Once | INTRAVENOUS | Status: AC
  Administered 2023-01-28: 10 mL

## 2023-01-28 MED ORDER — ROPIVACAINE HCL 2 MG/ML IJ SOLN
INTRAMUSCULAR | Status: AC
  Filled 2023-01-28: qty 20

## 2023-01-28 MED ORDER — LIDOCAINE HCL (PF) 1 % IJ SOLN
INTRAMUSCULAR | Status: AC
  Filled 2023-01-28: qty 10

## 2023-01-28 MED ORDER — SODIUM CHLORIDE (PF) 0.9 % IJ SOLN
INTRAMUSCULAR | Status: AC
  Filled 2023-01-28: qty 10

## 2023-01-28 MED ORDER — ROPIVACAINE HCL 2 MG/ML IJ SOLN
10.0000 mL | Freq: Once | INTRAMUSCULAR | Status: AC
  Administered 2023-01-28: 10 mL via EPIDURAL

## 2023-01-28 MED ORDER — LIDOCAINE HCL (PF) 1 % IJ SOLN
5.0000 mL | Freq: Once | INTRAMUSCULAR | Status: AC
  Administered 2023-01-28: 5 mL via SUBCUTANEOUS

## 2023-01-28 MED ORDER — IOHEXOL 180 MG/ML  SOLN
10.0000 mL | Freq: Once | INTRAMUSCULAR | Status: AC | PRN
  Administered 2023-01-28: 10 mL via EPIDURAL
  Filled 2023-01-28: qty 20

## 2023-01-28 MED ORDER — TRIAMCINOLONE ACETONIDE 40 MG/ML IJ SUSP
40.0000 mg | Freq: Once | INTRAMUSCULAR | Status: AC
  Administered 2023-01-28: 40 mg

## 2023-01-28 NOTE — Progress Notes (Signed)
Safety precautions to be maintained throughout the outpatient stay will include: orient to surroundings, keep bed in low position, maintain call bell within reach at all times, provide assistance with transfer out of bed and ambulation.  

## 2023-01-28 NOTE — Progress Notes (Signed)
Subjective:  Patient ID: Lindsey Carson, female    DOB: 06-14-1966  Age: 57 y.o. MRN: [id number]  CC: Leg Pain (Right calf)   Procedure: L5-S1 epidural steroid under fluoroscopic guidance with no sedation  HPI Lindsey Carson presents for reevaluation.  She was last seen back in October and had an epidural at that time which gave her significant improvement in her left lower leg pain which is still resolved at this point.  She has had some recurrence of her back pain and is now having right posterior lateral calf pain which has been severe to the point where she has had to leave work on a few occasions and is having difficulty performing her daily activity.  She presents today requesting an epidural steroid injection to see if this can help with back pain.  No change in bowel or bladder function is noted.  Otherwise she is taking some Tylenol to assist with pain but no other medications.  No reported weakness is noted.  Outpatient Medications Prior to Visit  Medication Sig Dispense Refill   Abaloparatide (TYMLOS) 3120 MCG/1.56ML SOPN INJECT 1 PEN (80 MCG) UNDER THE SKIN ONCE DAILY AS DIRECTED 1.56 mL 12   aspirin EC 81 MG tablet Take 1 tablet (81 mg total) by mouth daily. (Patient taking differently: Take 81 mg by mouth at bedtime.) 90 tablet 3   cetirizine (ZYRTEC) 10 MG tablet Take 10 mg by mouth at bedtime.      Cholecalciferol (VITAMIN D) 2000 units CAPS Take by mouth at bedtime.     hydrOXYzine (ATARAX) 10 MG tablet Take 1 tablet (10 mg total) by mouth 3 (three) times daily as needed. 30 tablet 0   Insulin Pen Needle 32G X 4 MM MISC Use as directed 100 each 11   Magnesium 250 MG TABS Take 250 mg by mouth at bedtime.     metoprolol succinate (TOPROL-XL) 25 MG 24 hr tablet TAKE 1 TABLET BY MOUTH ONCE A DAY 30 tablet 12   nortriptyline (PAMELOR) 10 MG capsule Take 1 capsule (10 mg total) by mouth at bedtime. 90 capsule 1   triamcinolone (NASACORT) 55 MCG/ACT nasal inhaler Place 1 spray into the  nose daily.      No facility-administered medications prior to visit.    Review of Systems CNS: No confusion or sedation Cardiac: No angina or palpitations GI: No abdominal pain or constipation Constitutional: No nausea vomiting fevers or chills  Objective:  BP 125/82   Pulse (!) 109   Temp (!) 97.2 F (36.2 C)   Resp 16   Ht 5\' 3"  (1.6 m)   Wt 170 lb (77.1 kg)   SpO2 98%   BMI 30.11 kg/m    BP Readings from Last 3 Encounters:  01/28/23 125/82  01/20/23 (!) 145/90  12/27/22 (!) 146/96     Wt Readings from Last 3 Encounters:  01/28/23 170 lb (77.1 kg)  10/26/22 171 lb (77.6 kg)  08/20/22 170 lb (77.1 kg)     Physical Exam Pt is alert and oriented PERRL EOMI HEART IS RRR no murmur or rub LCTA no wheezing or rales MUSCULOSKELETAL reveals a positive straight leg raise on the right side negative on the left.  Muscle tone and bulk is good.  No trigger points are noted in the lumbar region.  Labs  Lab Results  Component Value Date   HGBA1C 6.2 04/09/2022   HGBA1C 5.4 06/27/2017   HGBA1C 5.5 12/29/2015   Lab Results  Component Value  Date   LDLCALC 125 (H) 04/06/2019   CREATININE 0.90 07/27/2022    -------------------------------------------------------------------------------------------------------------------- Lab Results  Component Value Date   WBC 5.3 04/06/2019   HGB 14.3 04/06/2019   HCT 40.6 04/06/2019   PLT 158.0 04/06/2019   GLUCOSE 101 (H) 07/27/2022   CHOL 235 (H) 04/09/2022   TRIG 248.0 (H) 04/09/2022   HDL 38.80 (L) 04/09/2022   LDLDIRECT 160.0 04/09/2022   LDLCALC 125 (H) 04/06/2019   ALT 25 07/27/2022   AST 20 07/27/2022   NA 141 07/27/2022   K 4.2 07/27/2022   CL 107 07/27/2022   CREATININE 0.90 07/27/2022   BUN 15 07/27/2022   CO2 26 07/27/2022   TSH 5.92 (H) 10/26/2022   HGBA1C 6.2 04/09/2022    --------------------------------------------------------------------------------------------------------------------- DG PAIN  CLINIC C-ARM 1-60 MIN NO REPORT  Result Date: 01/28/2023 Fluoro was used, but no Radiologist interpretation will be provided. Please refer to "NOTES" tab for provider progress note.    Assessment & Plan:   Lindsey Carson was seen today for leg pain.  Diagnoses and all orders for this visit:  DDD (degenerative disc disease), lumbar  Bilateral sciatica  Lumbar disc herniation with radiculopathy  Chronic bilateral low back pain with bilateral sciatica  Facet arthropathy, lumbar  Previous back surgery  Chronic bilateral low back pain with right-sided sciatica  Other orders -     triamcinolone acetonide (KENALOG-40) injection 40 mg -     sodium chloride flush (NS) 0.9 % injection 10 mL -     ropivacaine (PF) 2 mg/mL (0.2%) (NAROPIN) injection 10 mL -     lidocaine (PF) (XYLOCAINE) 1 % injection 5 mL -     iohexol (OMNIPAQUE) 180 MG/ML injection 10 mL        ----------------------------------------------------------------------------------------------------------------------  Problem List Items Addressed This Visit       Unprioritized   Chronic bilateral low back pain with bilateral sciatica   DDD (degenerative disc disease), lumbar - Primary   Facet arthropathy, lumbar   Lumbar disc herniation with radiculopathy   Previous back surgery   Other Visit Diagnoses     Bilateral sciatica       Chronic bilateral low back pain with right-sided sciatica       Relevant Medications   triamcinolone acetonide (KENALOG-40) injection 40 mg (Completed)   ropivacaine (PF) 2 mg/mL (0.2%) (NAROPIN) injection 10 mL (Completed)   lidocaine (PF) (XYLOCAINE) 1 % injection 5 mL (Completed)         ----------------------------------------------------------------------------------------------------------------------  1. DDD (degenerative disc disease), lumbar We will proceed with a repeat epidural injection today.  Will likely plan on doing this at the L5-S1 level to see if we can help with  some of the L5-S1 symptoms she is experiencing.  We gone over the risks and benefits of the procedure with her in full detail all questions were answered.  I want her to continue with core stretching strengthening exercises and we will schedule I month return for follow-up.  2. Bilateral sciatica As above  3. Lumbar disc herniation with radiculopathy As above  4. Chronic bilateral low back pain with bilateral sciatica   5. Facet arthropathy, lumbar   6. Previous back surgery   7. Chronic bilateral low back pain with right-sided sciatica Additionally we talked about some core stretching strengthening exercises and the need to continue with aerobic conditioning and occluding possible elliptical training and walking.    ----------------------------------------------------------------------------------------------------------------------  I am having Faythe Dingwall D. Martha maintain her triamcinolone, cetirizine, aspirin EC, Magnesium,  Vitamin D, metoprolol succinate, hydrOXYzine, Insulin Pen Needle, Tymlos, and nortriptyline. We administered triamcinolone acetonide, sodium chloride flush, ropivacaine (PF) 2 mg/mL (0.2%), lidocaine (PF), and iohexol.   Meds ordered this encounter  Medications   triamcinolone acetonide (KENALOG-40) injection 40 mg   sodium chloride flush (NS) 0.9 % injection 10 mL   ropivacaine (PF) 2 mg/mL (0.2%) (NAROPIN) injection 10 mL   lidocaine (PF) (XYLOCAINE) 1 % injection 5 mL   iohexol (OMNIPAQUE) 180 MG/ML injection 10 mL   Patient's Medications  New Prescriptions   No medications on file  Previous Medications   ABALOPARATIDE (TYMLOS) 3120 MCG/1.56ML SOPN    INJECT 1 PEN (80 MCG) UNDER THE SKIN ONCE DAILY AS DIRECTED   ASPIRIN EC 81 MG TABLET    Take 1 tablet (81 mg total) by mouth daily.   CETIRIZINE (ZYRTEC) 10 MG TABLET    Take 10 mg by mouth at bedtime.    CHOLECALCIFEROL (VITAMIN D) 2000 UNITS CAPS    Take by mouth at bedtime.   HYDROXYZINE (ATARAX) 10 MG  TABLET    Take 1 tablet (10 mg total) by mouth 3 (three) times daily as needed.   INSULIN PEN NEEDLE 32G X 4 MM MISC    Use as directed   MAGNESIUM 250 MG TABS    Take 250 mg by mouth at bedtime.   METOPROLOL SUCCINATE (TOPROL-XL) 25 MG 24 HR TABLET    TAKE 1 TABLET BY MOUTH ONCE A DAY   NORTRIPTYLINE (PAMELOR) 10 MG CAPSULE    Take 1 capsule (10 mg total) by mouth at bedtime.   TRIAMCINOLONE (NASACORT) 55 MCG/ACT NASAL INHALER    Place 1 spray into the nose daily.   Modified Medications   No medications on file  Discontinued Medications   No medications on file   ----------------------------------------------------------------------------------------------------------------------  Follow-up: Return in about 1 month (around 02/28/2023) for evaluation, med refill.   Procedure: L5-S1 LESI with fluoroscopic guidance and without moderate sedation  NOTE: The risks, benefits, and expectations of the procedure have been discussed and explained to the patient who was understanding and in agreement with suggested treatment plan. No guarantees were made.  DESCRIPTION OF PROCEDURE: Lumbar epidural steroid injection with no IV Versed, EKG, blood pressure, pulse, and pulse oximetry monitoring. The procedure was performed with the patient in the prone position under fluoroscopic guidance.  Sterile prep x3 was initiated and I then injected subcutaneous lidocaine to the overlying L5-S1 site after its fluoroscopic identifictation.  Using strict aseptic technique, I then advanced an 18-gauge Tuohy epidural needle in the midline using interlaminar approach via loss-of-resistance to saline technique. There was negative aspiration for heme or  CSF.  I then confirmed position with both AP and Lateral fluoroscan.  2 cc of contrast dye were injected and a  total of 5 mL of Preservative-Free normal saline mixed with 40 mg of Kenalog and 1cc Ropicaine 0.2 percent were injected incrementally via the  epidurally placed  needle. The needle was removed. The patient tolerated the injection well and was convalesced and discharged to home in stable condition. Should the patient have any post procedure difficulty they have been instructed on how to contact us for assistance.   Molli Barrows, MD

## 2023-01-29 ENCOUNTER — Encounter: Payer: Self-pay | Admitting: Family Medicine

## 2023-01-29 ENCOUNTER — Ambulatory Visit (INDEPENDENT_AMBULATORY_CARE_PROVIDER_SITE_OTHER): Payer: 59 | Admitting: Family Medicine

## 2023-01-29 VITALS — BP 124/76 | HR 88 | Temp 98.0°F | Resp 16 | Ht 63.0 in | Wt 172.6 lb

## 2023-01-29 DIAGNOSIS — F32A Depression, unspecified: Secondary | ICD-10-CM | POA: Diagnosis not present

## 2023-01-29 DIAGNOSIS — M5442 Lumbago with sciatica, left side: Secondary | ICD-10-CM

## 2023-01-29 DIAGNOSIS — E039 Hypothyroidism, unspecified: Secondary | ICD-10-CM

## 2023-01-29 DIAGNOSIS — J989 Respiratory disorder, unspecified: Secondary | ICD-10-CM | POA: Diagnosis not present

## 2023-01-29 DIAGNOSIS — F419 Anxiety disorder, unspecified: Secondary | ICD-10-CM

## 2023-01-29 DIAGNOSIS — I10 Essential (primary) hypertension: Secondary | ICD-10-CM | POA: Diagnosis not present

## 2023-01-29 DIAGNOSIS — E7800 Pure hypercholesterolemia, unspecified: Secondary | ICD-10-CM

## 2023-01-29 DIAGNOSIS — M810 Age-related osteoporosis without current pathological fracture: Secondary | ICD-10-CM

## 2023-01-29 DIAGNOSIS — E213 Hyperparathyroidism, unspecified: Secondary | ICD-10-CM

## 2023-01-29 DIAGNOSIS — G8929 Other chronic pain: Secondary | ICD-10-CM

## 2023-01-29 DIAGNOSIS — M5441 Lumbago with sciatica, right side: Secondary | ICD-10-CM | POA: Diagnosis not present

## 2023-01-29 LAB — TSH: TSH: 0.19 u[IU]/mL — ABNORMAL LOW (ref 0.35–5.50)

## 2023-01-29 NOTE — Patient Instructions (Addendum)
Nice to see you. Will check a TSH and contact you with the results. Please start checking her blood pressure at home.  Your goal blood pressure is less than 140/90.  If it is running above that please let me know.

## 2023-01-29 NOTE — Assessment & Plan Note (Signed)
Chronic issue.  Adequately controlled.  Continue metoprolol 25 mg daily.

## 2023-01-29 NOTE — Assessment & Plan Note (Signed)
Improving.  She will monitor for any worsening symptoms.

## 2023-01-29 NOTE — Progress Notes (Signed)
Lindsey Rumps, MD Phone: [phone]  Lindsey Carson is a 57 y.o. female who presents today for f/u.  HYPERTENSION Disease Monitoring Home BP Monitoring not checking at home Chest pain- no    Dyspnea- no Medications Compliance-  taking metoprolol.  Edema- no BMET    Component Value Date/Time   NA 141 07/27/2022 0958   K 4.2 07/27/2022 0958   CL 107 07/27/2022 0958   CO2 26 07/27/2022 0958   GLUCOSE 101 (H) 07/27/2022 0958   BUN 15 07/27/2022 0958   CREATININE 0.90 07/27/2022 0958   CALCIUM 9.4 07/27/2022 0958   GFRNONAA >60 08/27/2018 1411   GFRAA >60 08/27/2018 1411   The 10-year ASCVD risk score (Arnett DK, et al., 2019) is: 4.4%   Values used to calculate the score:     Age: 13 years     Sex: Female     Is Non-Hispanic African American: No     Diabetic: No     Tobacco smoker: No     Systolic Blood Pressure: [id number] mmHg     Is BP treated: Yes     HDL Cholesterol: 38.8 mg/dL     Total Cholesterol: 235 mg/dL   HYPOTHYROIDISM Disease Monitoring Weight changes: no  Skin Changes: no Heat/Cold intolerance: no Fatigue: no  Medication Monitoring Compliance:  taking synthroid 25 mcg on Friday and 50 mcg the other days of the week   Last TSH:   Lab Results  Component Value Date   TSH 5.92 (H) 10/26/2022   Anxiety/depression: Patient notes this is better.  It is mild.  She rarely takes hydroxyzine.  She continues on nortriptyline.  No SI.  Respiratory illness: Patient recently evaluated and treated for this through urgent care.  She notes she is getting better.  She reports she had an episode of glass in her foot and had that removed at urgent care.  Chronic back pain: Patient reports having an epidural yesterday.  She has not had any benefit from it yet.  She continues to have right calf pain.  She had an orthopedist look at her as a curbside consult in the hospital and she notes they advised that the calf pain was not really an orthopedic issue.  Social History    Tobacco Use  Smoking Status Never  Smokeless Tobacco Never    Current Outpatient Medications on File Prior to Visit  Medication Sig Dispense Refill   Abaloparatide (TYMLOS) 3120 MCG/1.56ML SOPN INJECT 1 PEN (80 MCG) UNDER THE SKIN ONCE DAILY AS DIRECTED 1.56 mL 12   aspirin EC 81 MG tablet Take 1 tablet (81 mg total) by mouth daily. (Patient taking differently: Take 81 mg by mouth at bedtime.) 90 tablet 3   cetirizine (ZYRTEC) 10 MG tablet Take 10 mg by mouth at bedtime.      Cholecalciferol (VITAMIN D) 2000 units CAPS Take by mouth at bedtime.     hydrOXYzine (ATARAX) 10 MG tablet Take 1 tablet (10 mg total) by mouth 3 (three) times daily as needed. 30 tablet 0   Insulin Pen Needle 32G X 4 MM MISC Use as directed 100 each 11   Magnesium 250 MG TABS Take 250 mg by mouth at bedtime.     metoprolol succinate (TOPROL-XL) 25 MG 24 hr tablet TAKE 1 TABLET BY MOUTH ONCE A DAY 30 tablet 12   nortriptyline (PAMELOR) 10 MG capsule Take 1 capsule (10 mg total) by mouth at bedtime. 90 capsule 1   triamcinolone (NASACORT) 55 MCG/ACT nasal inhaler  Place 1 spray into the nose daily.      No current facility-administered medications on file prior to visit.     ROS see history of present illness  Objective  Physical Exam Vitals:   01/29/23 0830  BP: 124/76  Pulse: 88  Resp: 16  Temp: 98 F (36.7 C)  SpO2: 98%    BP Readings from Last 3 Encounters:  01/29/23 124/76  01/28/23 125/82  01/20/23 (!) 145/90   Wt Readings from Last 3 Encounters:  01/29/23 172 lb 9.6 oz (78.3 kg)  01/28/23 170 lb (77.1 kg)  10/26/22 171 lb (77.6 kg)    Physical Exam Constitutional:      General: She is not in acute distress.    Appearance: She is not diaphoretic.  Cardiovascular:     Rate and Rhythm: Normal rate and regular rhythm.     Heart sounds: Normal heart sounds.  Pulmonary:     Effort: Pulmonary effort is normal.     Breath sounds: Normal breath sounds.  Musculoskeletal:     Comments:  Slight right calf tenderness to palpation, no left calf tenderness to palpation  Skin:    General: Skin is warm and dry.  Neurological:     Mental Status: She is alert.     Comments: 5/5 strength bilateral quads, hamstrings, plantarflexion, and dorsiflexion, sensation light touch intact bilateral lower extremities      Assessment/Plan: Please see individual problem list.  Primary hypertension Assessment & Plan: Chronic issue.  Adequately controlled.  Continue metoprolol 25 mg daily.   Hypothyroidism, unspecified type Assessment & Plan: Chronic issue.  Check TSH.  Continue Synthroid 25 mcg on Fridays and 50 mcg the other days of the week.  Orders: -     TSH  Anxiety and depression Assessment & Plan: Chronic issue.  Somewhat better.  She can continue nortriptyline 10 mg nightly and hydroxyzine 10 mg 3 times daily as needed for anxiety.   Chronic bilateral low back pain with bilateral sciatica Assessment & Plan: Chronic issue.  Right calf symptoms could be nerve impingement related.  Less likely muscular related given her report of orthopedic evaluation.  Prior D-dimer was negative.  She will see how this improves after her epidural injection.   Respiratory illness Assessment & Plan: Improving.  She will monitor for any worsening symptoms.     Return in about 6 months (around 08/01/2023) for physical.   Lindsey Rumps, MD [facility]

## 2023-01-29 NOTE — Assessment & Plan Note (Signed)
Chronic issue.  Somewhat better.  She can continue nortriptyline 10 mg nightly and hydroxyzine 10 mg 3 times daily as needed for anxiety.

## 2023-01-29 NOTE — Assessment & Plan Note (Signed)
Chronic issue.  Right calf symptoms could be nerve impingement related.  Less likely muscular related given her report of orthopedic evaluation.  Prior D-dimer was negative.  She will see how this improves after her epidural injection.

## 2023-01-29 NOTE — Assessment & Plan Note (Signed)
Chronic issue.  Check TSH.  Continue Synthroid 25 mcg on Fridays and 50 mcg the other days of the week.

## 2023-02-14 ENCOUNTER — Other Ambulatory Visit (HOSPITAL_COMMUNITY): Payer: Self-pay

## 2023-02-14 MED FILL — Nortriptyline HCl Cap 10 MG: ORAL | 90 days supply | Qty: 90 | Fill #1 | Status: AC

## 2023-02-15 ENCOUNTER — Other Ambulatory Visit: Payer: Self-pay

## 2023-02-18 ENCOUNTER — Other Ambulatory Visit: Payer: Self-pay

## 2023-02-18 MED ORDER — LEVOTHYROXINE SODIUM 50 MCG PO TABS
50.0000 ug | ORAL_TABLET | Freq: Every day | ORAL | 3 refills | Status: DC
  Filled 2023-02-18: qty 90, 90d supply, fill #0
  Filled 2023-06-04: qty 90, 90d supply, fill #1
  Filled 2023-09-10: qty 90, 90d supply, fill #2
  Filled 2023-12-26: qty 90, 90d supply, fill #3

## 2023-02-20 ENCOUNTER — Other Ambulatory Visit (HOSPITAL_COMMUNITY): Payer: Self-pay

## 2023-02-20 ENCOUNTER — Other Ambulatory Visit: Payer: Self-pay

## 2023-02-20 DIAGNOSIS — D1801 Hemangioma of skin and subcutaneous tissue: Secondary | ICD-10-CM | POA: Diagnosis not present

## 2023-02-20 DIAGNOSIS — D2262 Melanocytic nevi of left upper limb, including shoulder: Secondary | ICD-10-CM | POA: Diagnosis not present

## 2023-02-20 DIAGNOSIS — D225 Melanocytic nevi of trunk: Secondary | ICD-10-CM | POA: Diagnosis not present

## 2023-02-20 DIAGNOSIS — L814 Other melanin hyperpigmentation: Secondary | ICD-10-CM | POA: Diagnosis not present

## 2023-02-20 DIAGNOSIS — L821 Other seborrheic keratosis: Secondary | ICD-10-CM | POA: Diagnosis not present

## 2023-02-20 DIAGNOSIS — D2239 Melanocytic nevi of other parts of face: Secondary | ICD-10-CM | POA: Diagnosis not present

## 2023-02-20 DIAGNOSIS — L720 Epidermal cyst: Secondary | ICD-10-CM | POA: Diagnosis not present

## 2023-02-21 ENCOUNTER — Ambulatory Visit (AMBULATORY_SURGERY_CENTER): Payer: 59 | Admitting: *Deleted

## 2023-02-21 ENCOUNTER — Encounter: Payer: Self-pay | Admitting: Internal Medicine

## 2023-02-21 ENCOUNTER — Other Ambulatory Visit: Payer: Self-pay

## 2023-02-21 VITALS — Ht 63.0 in | Wt 172.0 lb

## 2023-02-21 DIAGNOSIS — Z800 Family history of malignant neoplasm of digestive organs: Secondary | ICD-10-CM

## 2023-02-21 MED ORDER — NA SULFATE-K SULFATE-MG SULF 17.5-3.13-1.6 GM/177ML PO SOLN
1.0000 | Freq: Once | ORAL | 0 refills | Status: AC
  Filled 2023-02-21: qty 354, 1d supply, fill #0

## 2023-02-21 NOTE — Progress Notes (Signed)

## 2023-02-26 ENCOUNTER — Other Ambulatory Visit (HOSPITAL_COMMUNITY): Payer: Self-pay

## 2023-02-28 ENCOUNTER — Ambulatory Visit: Payer: 59 | Attending: Pain Medicine | Admitting: Anesthesiology

## 2023-03-01 ENCOUNTER — Other Ambulatory Visit: Payer: Self-pay

## 2023-03-07 ENCOUNTER — Ambulatory Visit (AMBULATORY_SURGERY_CENTER): Payer: 59 | Admitting: Internal Medicine

## 2023-03-07 ENCOUNTER — Other Ambulatory Visit: Payer: 59

## 2023-03-07 ENCOUNTER — Encounter: Payer: Self-pay | Admitting: Internal Medicine

## 2023-03-07 VITALS — BP 120/73 | HR 76 | Temp 98.0°F | Resp 15 | Ht 63.0 in | Wt 172.0 lb

## 2023-03-07 DIAGNOSIS — D120 Benign neoplasm of cecum: Secondary | ICD-10-CM | POA: Diagnosis not present

## 2023-03-07 DIAGNOSIS — I10 Essential (primary) hypertension: Secondary | ICD-10-CM | POA: Diagnosis not present

## 2023-03-07 DIAGNOSIS — D122 Benign neoplasm of ascending colon: Secondary | ICD-10-CM

## 2023-03-07 DIAGNOSIS — Z800 Family history of malignant neoplasm of digestive organs: Secondary | ICD-10-CM

## 2023-03-07 DIAGNOSIS — R112 Nausea with vomiting, unspecified: Secondary | ICD-10-CM | POA: Diagnosis not present

## 2023-03-07 DIAGNOSIS — Z1211 Encounter for screening for malignant neoplasm of colon: Secondary | ICD-10-CM

## 2023-03-07 DIAGNOSIS — F419 Anxiety disorder, unspecified: Secondary | ICD-10-CM | POA: Diagnosis not present

## 2023-03-07 DIAGNOSIS — E039 Hypothyroidism, unspecified: Secondary | ICD-10-CM | POA: Diagnosis not present

## 2023-03-07 MED ORDER — SODIUM CHLORIDE 0.9 % IV SOLN: 500.0000 mL | Freq: Once | INTRAVENOUS | Status: DC

## 2023-03-07 NOTE — Op Note (Signed)
[facility] Patient Name: Lindsey Carson Procedure Date: 03/07/2023 2:07 PM MRN: [id number] Endoscopist: Beverley Fiedler , MD, [id number] Age: 57 Referring MD:  Date of Birth: [birth year] Gender: Female Account #: [id number] Procedure:                Colonoscopy Indications:              Screening patient at increased risk: Family history                            of colorectal cancer in multiple 2nd-degree                            relatives, Last colonoscopy: October 2018 Medicines:                Monitored Anesthesia Care Procedure:                Pre-Anesthesia Assessment:                           - Prior to the procedure, a History and Physical                            was performed, and patient medications and                            allergies were reviewed. The patient's tolerance of                            previous anesthesia was also reviewed. The risks                            and benefits of the procedure and the sedation                            options and risks were discussed with the patient.                            All questions were answered, and informed consent                            was obtained. Prior Anticoagulants: The patient has                            taken no anticoagulant or antiplatelet agents. ASA                            Grade Assessment: II - A patient with mild systemic                            disease. After reviewing the risks and benefits,                            the patient was deemed in satisfactory condition to  undergo the procedure.                           After obtaining informed consent, the colonoscope                            was passed under direct vision. Throughout the                            procedure, the patient's blood pressure, pulse, and                            oxygen saturations were monitored continuously. The                            Olympus [id number] SN  [id number] was introduced through                            the anus and advanced to the cecum, identified by                            palpation. The colonoscopy was performed without                            difficulty. The patient tolerated the procedure                            well. The quality of the bowel preparation was                            good. The ileocecal valve, appendiceal orifice, and                            rectum were photographed. Scope In: 2:18:33 PM Scope Out: 2:32:00 PM Scope Withdrawal Time: 0 hours 10 minutes 6 seconds  Total Procedure Duration: 0 hours 13 minutes 27 seconds  Findings:                 The digital rectal exam was normal.                           Two sessile polyps were found in the cecum. The                            polyps were 2 to 3 mm in size. These polyps were                            removed with a cold snare. Resection and retrieval                            were complete.                           Two sessile polyps were found in the ascending  colon. The polyps were 4 to 5 mm in size. These                            polyps were removed with a cold snare. Resection                            and retrieval were complete.                           A few small-mouthed diverticula were found in the                            sigmoid colon.                           The retroflexed view of the distal rectum and anal                            verge was normal and showed no anal or rectal                            abnormalities. Complications:            No immediate complications. Estimated Blood Loss:     Estimated blood loss was minimal. Impression:               - Two 2 to 3 mm polyps in the cecum, removed with a                            cold snare. Resected and retrieved.                           - Two 4 to 5 mm polyps in the ascending colon,                            removed with a cold  snare. Resected and retrieved.                           - Mild diverticulosis in the sigmoid colon.                           - The distal rectum and anal verge are normal on                            retroflexion view. Recommendation:           - Patient has a contact number available for                            emergencies. The signs and symptoms of potential                            delayed complications were discussed with the  patient. Return to normal activities tomorrow.                            Written discharge instructions were provided to the                            patient.                           - Resume previous diet.                           - Continue present medications.                           - Await pathology results.                           - Repeat colonoscopy is recommended. The                            colonoscopy date will be determined after pathology                            results from today's exam become available for                            review. Beverley Fiedler, MD 03/07/2023 2:35:29 PM This report has been signed electronically.

## 2023-03-07 NOTE — Progress Notes (Signed)
Called to room to assist during endoscopic procedure.  Patient ID and intended procedure confirmed with present staff. Received instructions for my participation in the procedure from the performing physician.  

## 2023-03-07 NOTE — Progress Notes (Signed)
Pt's states no medical or surgical changes since previsit or office visit. 

## 2023-03-07 NOTE — Patient Instructions (Signed)
YOU HAD AN ENDOSCOPIC PROCEDURE TODAY AT THE [facility]:   Refer to the procedure report that was given to you for any specific questions about what was found during the examination.  If the procedure report does not answer your questions, please call your gastroenterologist to clarify.  If you requested that your care partner not be given the details of your procedure findings, then the procedure report has been included in a sealed envelope for you to review at your convenience later.  **Handouts given on polyps and diverticulosis**  YOU SHOULD EXPECT: Some feelings of bloating in the abdomen. Passage of more gas than usual.  Walking can help get rid of the air that was put into your GI tract during the procedure and reduce the bloating. If you had a lower endoscopy (such as a colonoscopy or flexible sigmoidoscopy) you may notice spotting of blood in your stool or on the toilet paper. If you underwent a bowel prep for your procedure, you may not have a normal bowel movement for a few days.  Please Note:  You might notice some irritation and congestion in your nose or some drainage.  This is from the oxygen used during your procedure.  There is no need for concern and it should clear up in a day or so.  SYMPTOMS TO REPORT IMMEDIATELY:  Following lower endoscopy (colonoscopy or flexible sigmoidoscopy):  Excessive amounts of blood in the stool  Significant tenderness or worsening of abdominal pains  Swelling of the abdomen that is new, acute  Fever of 100F or higher  For urgent or emergent issues, a gastroenterologist can be reached at any hour by calling [phone]. Do not use MyChart messaging for urgent concerns.    DIET:  We do recommend a small meal at first, but then you may proceed to your regular diet.  Drink plenty of fluids but you should avoid alcoholic beverages for 24 hours.  ACTIVITY:  You should plan to take it easy for the rest of today and you should NOT DRIVE  or use heavy machinery until tomorrow (because of the sedation medicines used during the test).    FOLLOW UP: Our staff will call the number listed on your records the next business day following your procedure.  We will call around 7:15- 8:00 am to check on you and address any questions or concerns that you may have regarding the information given to you following your procedure. If we do not reach you, we will leave a message.     If any biopsies were taken you will be contacted by phone or by letter within the next 1-3 weeks.  Please call us at [phone] if you have not heard about the biopsies in 3 weeks.    SIGNATURES/CONFIDENTIALITY: You and/or your care partner have signed paperwork which will be entered into your electronic medical record.  These signatures attest to the fact that that the information above on your After Visit Summary has been reviewed and is understood.  Full responsibility of the confidentiality of this discharge information lies with you and/or your care-partner. 

## 2023-03-07 NOTE — Progress Notes (Signed)
GASTROENTEROLOGY PROCEDURE H&P NOTE   Primary Care Physician: Glori Luis, MD    Reason for Procedure:  Family history of colon cancer in multiple second-degree relatives  Plan:    Colonoscopy  Patient is appropriate for endoscopic procedure(s) in the ambulatory ([facility]) setting.  The nature of the procedure, as well as the risks, benefits, and alternatives were carefully and thoroughly reviewed with the patient. Ample time for discussion and questions allowed. The patient understood, was satisfied, and agreed to proceed.     HPI: Lindsey Carson is a 57 y.o. female who presents for colonoscopy.  Medical history as below.  Tolerated the prep.  No recent chest pain or shortness of breath.  No abdominal pain today.  Past Medical History:  Diagnosis Date   Allergy    Anxiety    Arthritis    OA   Chronic allergic rhinitis    Severe, year long   Chronic back pain    HNP - history   Chronic low back pain 01/28/2020   Elevated blood pressure    History of   Facet arthropathy, lumbar 01/28/2020   Headache    Heart murmur    was told but doesn't require meds - never has been a problem   Hemorrhoids    History OF   Hypertension    OFF BP MEDS FOR 6 MONTHS   Joint pain    hands and knees, hips and shoulders   Osteoporosis    PONV (postoperative nausea and vomiting)    Previous back surgery 01/28/2020   Sciatica of right side 01/28/2020   Seasonal allergies    uses nasocort daily and zyrtec   Sinus infection    History of frequent   Spinal fracture YRS AGO   L5/S1   Thyroid disease     Past Surgical History:  Procedure Laterality Date   CESAREAN SECTION  1994 and 1998   x 2    COLONSCOPY  08/2017   DILATION AND CURETTAGE OF UTERUS  08/22/2017   LUMBAR LAMINECTOMY/DECOMPRESSION MICRODISCECTOMY  09/08/2012   Procedure: LUMBAR LAMINECTOMY/DECOMPRESSION MICRODISCECTOMY 1 LEVEL;  Surgeon: Temple Pacini, MD;  Location: [facility];  Service: Neurosurgery;   Laterality: Left;  left Lumbar Five-Sacral one laminectomy/microdiscectomy   PARATHYROIDECTOMY Left 09/04/2018   Procedure: LEFT INFERIOR PARATHYROIDECTOMY WITH  NECK EXPLORATION;  Surgeon: Darnell Level, MD;  Location: [facility];  Service: General;  Laterality: Left;   WISDOM TOOTH EXTRACTION  1984    Prior to Admission medications   Medication Sig Start Date End Date Taking? Authorizing Provider  Abaloparatide (TYMLOS) 3120 MCG/1.56ML SOPN INJECT 1 PEN (80 MCG) UNDER THE SKIN ONCE DAILY AS DIRECTED 11/12/22  Yes Quentin Angst, MD  aspirin EC 81 MG tablet Take 1 tablet (81 mg total) by mouth daily. Patient taking differently: Take 81 mg by mouth at bedtime. 01/10/17  Yes Almond Lint, MD  cetirizine (ZYRTEC) 10 MG tablet Take 10 mg by mouth at bedtime.    Yes [provider]  Cholecalciferol (VITAMIN D) 2000 units CAPS Take by mouth at bedtime.   Yes [provider]  Insulin Pen Needle 32G X 4 MM MISC Use as directed 05/17/22  Yes   levothyroxine (SYNTHROID) 50 MCG tablet Take 1 tablet (50 mcg total) by mouth daily 6 days of the week 02/18/23  Yes   Magnesium 250 MG TABS Take 250 mg by mouth at bedtime.   Yes [provider]  metoprolol succinate (TOPROL-XL) 25 MG 24  hr tablet Take 1 tablet (25 mg total) by mouth daily. 04/05/22  Yes   nortriptyline (PAMELOR) 10 MG capsule Take 1 capsule (10 mg total) by mouth at bedtime. 11/16/22 11/16/23 Yes Glori Luis, MD  triamcinolone (NASACORT) 55 MCG/ACT nasal inhaler Place 1 spray into the nose daily.    Yes [provider]    Current Outpatient Medications  Medication Sig Dispense Refill   Abaloparatide (TYMLOS) 3120 MCG/1.56ML SOPN INJECT 1 PEN (80 MCG) UNDER THE SKIN ONCE DAILY AS DIRECTED 1.56 mL 12   aspirin EC 81 MG tablet Take 1 tablet (81 mg total) by mouth daily. (Patient taking differently: Take 81 mg by mouth at bedtime.) 90 tablet 3   cetirizine (ZYRTEC) 10 MG tablet Take 10 mg by mouth at bedtime.       Cholecalciferol (VITAMIN D) 2000 units CAPS Take by mouth at bedtime.     Insulin Pen Needle 32G X 4 MM MISC Use as directed 100 each 11   levothyroxine (SYNTHROID) 50 MCG tablet Take 1 tablet (50 mcg total) by mouth daily 6 days of the week 90 tablet 3   Magnesium 250 MG TABS Take 250 mg by mouth at bedtime.     metoprolol succinate (TOPROL-XL) 25 MG 24 hr tablet Take 1 tablet (25 mg total) by mouth daily. 30 tablet 12   nortriptyline (PAMELOR) 10 MG capsule Take 1 capsule (10 mg total) by mouth at bedtime. 90 capsule 1   triamcinolone (NASACORT) 55 MCG/ACT nasal inhaler Place 1 spray into the nose daily.      Current Facility-Administered Medications  Medication Dose Route Frequency Provider Last Rate Last Admin   0.9 %  sodium chloride infusion  500 mL Intravenous Once Yuchen Fedor, Carie Caddy, MD        Allergies as of 03/07/2023 - Review Complete 03/07/2023  Allergen Reaction Noted   Augmentin [amoxicillin-pot clavulanate] Diarrhea 12/27/2016   Lisinopril Cough 08/29/2017   Prednisone  01/20/2023   Latex Rash 12/27/2016    Family History  Problem Relation Age of Onset   Diabetes Mother    Hypertension Mother    Hypothyroidism Mother    Heart attack Father        MI   Coronary artery disease Father    Diabetes Father    Hemachromatosis Father    Diabetes Brother    Hemachromatosis Brother    Colon cancer Maternal Uncle    Colon cancer Maternal Grandfather    Colon polyps Neg Hx    Crohn's disease Neg Hx    Esophageal cancer Neg Hx    Rectal cancer Neg Hx    Stomach cancer Neg Hx    Ulcerative colitis Neg Hx     Social History   Socioeconomic History   Marital status: Married    Spouse name: Not on file   Number of children: Not on file   Years of education: Not on file   Highest education level: Bachelor's degree (e.g., BA, AB, BS)  Occupational History   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    Employer: [employer]  Tobacco Use   Smoking status: Never   Smokeless tobacco: Never   Vaping Use   Vaping Use: Never used  Substance and Sexual Activity   Alcohol use: No    Alcohol/week: 0.0 standard drinks of alcohol   Drug use: No   Sexual activity: Yes    Birth control/protection: Post-menopausal  Other Topics Concern   Not on file  Social History Narrative   Pt  is married   She has a Energy manager degree    She has 2 daughters    Social Determinants of Corporate investment banker Strain: Low Risk  (01/28/2023)   Overall Financial Resource Strain (CARDIA)    Difficulty of Paying Living Expenses: Not hard at all  Food Insecurity: No Food Insecurity (01/28/2023)   Hunger Vital Sign    Worried About Running Out of Food in the Last Year: Never true    Ran Out of Food in the Last Year: Never true  Transportation Needs: No Transportation Needs (01/28/2023)   PRAPARE - Administrator, Civil Service (Medical): No    Lack of Transportation (Non-Medical): No  Physical Activity: Insufficiently Active (01/28/2023)   Exercise Vital Sign    Days of Exercise per Week: 2 days    Minutes of Exercise per Session: 20 min  Stress: No Stress Concern Present (01/28/2023)   [employer] of Occupational Health - Occupational Stress Questionnaire    Feeling of Stress : Only a little  Social Connections: Socially Integrated (01/28/2023)   Social Connection and Isolation Panel [NHANES]    Frequency of Communication with Friends and Family: More than three times a week    Frequency of Social Gatherings with Friends and Family: Twice a week    Attends Religious Services: 1 to 4 times per year    Active Member of [employer] or Organizations: Yes    Attends Banker Meetings: 1 to 4 times per year    Marital Status: Married  Catering manager Violence: Not on file    Physical Exam: Vital signs in last 24 hours: @BP  (!) 143/82   Pulse 72   Temp 98 F (36.7 C) (Temporal)   Ht 5\' 3"  (1.6 m)   Wt 172 lb (78 kg)   SpO2 99%   BMI 30.47 kg/m  GEN: NAD EYE:  Sclerae anicteric ENT: MMM CV: Non-tachycardic Pulm: CTA b/l GI: Soft, NT/ND NEURO:  Alert & Oriented x 3   Erick Blinks, MD [facility]  03/07/2023 2:05 PM

## 2023-03-07 NOTE — Progress Notes (Signed)
Report to PACU, RN, vss, BBS= Clear.  

## 2023-03-08 ENCOUNTER — Telehealth: Payer: Self-pay

## 2023-03-08 NOTE — Telephone Encounter (Signed)
Unable to leave message mailbox full.

## 2023-03-12 ENCOUNTER — Ambulatory Visit: Payer: 59

## 2023-03-12 DIAGNOSIS — R112 Nausea with vomiting, unspecified: Secondary | ICD-10-CM

## 2023-03-13 ENCOUNTER — Encounter: Payer: Self-pay | Admitting: Internal Medicine

## 2023-03-13 LAB — HELICOBACTER PYLORI  SPECIAL ANTIGEN
MICRO NUMBER:: 14923980
SPECIMEN QUALITY: ADEQUATE

## 2023-03-15 ENCOUNTER — Other Ambulatory Visit: Payer: Self-pay

## 2023-03-15 DIAGNOSIS — R109 Unspecified abdominal pain: Secondary | ICD-10-CM

## 2023-03-15 NOTE — Telephone Encounter (Signed)
Abd [place] rule out gallstones

## 2023-03-20 ENCOUNTER — Other Ambulatory Visit (HOSPITAL_COMMUNITY): Payer: Self-pay

## 2023-03-21 ENCOUNTER — Other Ambulatory Visit: Payer: Self-pay

## 2023-03-21 ENCOUNTER — Other Ambulatory Visit (HOSPITAL_COMMUNITY): Payer: Self-pay

## 2023-03-22 ENCOUNTER — Ambulatory Visit
Admission: RE | Admit: 2023-03-22 | Discharge: 2023-03-22 | Disposition: A | Discharge status: Home / Self Care | Payer: 59 | Source: Ambulatory Visit | Attending: Internal Medicine | Admitting: Internal Medicine

## 2023-03-22 DIAGNOSIS — R109 Unspecified abdominal pain: Secondary | ICD-10-CM | POA: Diagnosis not present

## 2023-03-22 DIAGNOSIS — K7689 Other specified diseases of liver: Secondary | ICD-10-CM | POA: Diagnosis not present

## 2023-03-25 ENCOUNTER — Encounter: Payer: Self-pay | Admitting: Internal Medicine

## 2023-03-25 DIAGNOSIS — Z6830 Body mass index (BMI) 30.0-30.9, adult: Secondary | ICD-10-CM | POA: Diagnosis not present

## 2023-03-25 DIAGNOSIS — M810 Age-related osteoporosis without current pathological fracture: Secondary | ICD-10-CM | POA: Diagnosis not present

## 2023-03-25 DIAGNOSIS — N952 Postmenopausal atrophic vaginitis: Secondary | ICD-10-CM | POA: Diagnosis not present

## 2023-03-25 DIAGNOSIS — N393 Stress incontinence (female) (male): Secondary | ICD-10-CM | POA: Diagnosis not present

## 2023-03-25 DIAGNOSIS — Z1231 Encounter for screening mammogram for malignant neoplasm of breast: Secondary | ICD-10-CM | POA: Diagnosis not present

## 2023-03-25 DIAGNOSIS — Z01419 Encounter for gynecological examination (general) (routine) without abnormal findings: Secondary | ICD-10-CM | POA: Diagnosis not present

## 2023-03-26 ENCOUNTER — Other Ambulatory Visit (HOSPITAL_COMMUNITY): Payer: Self-pay

## 2023-03-29 ENCOUNTER — Encounter: Payer: 59 | Admitting: Internal Medicine

## 2023-03-31 DIAGNOSIS — S52501A Unspecified fracture of the lower end of right radius, initial encounter for closed fracture: Secondary | ICD-10-CM | POA: Diagnosis not present

## 2023-04-04 DIAGNOSIS — S52501A Unspecified fracture of the lower end of right radius, initial encounter for closed fracture: Secondary | ICD-10-CM | POA: Diagnosis not present

## 2023-04-11 DIAGNOSIS — S52501A Unspecified fracture of the lower end of right radius, initial encounter for closed fracture: Secondary | ICD-10-CM | POA: Diagnosis not present

## 2023-04-16 ENCOUNTER — Other Ambulatory Visit (HOSPITAL_COMMUNITY): Payer: Self-pay

## 2023-04-16 ENCOUNTER — Other Ambulatory Visit: Payer: Self-pay

## 2023-04-17 ENCOUNTER — Other Ambulatory Visit (HOSPITAL_COMMUNITY): Payer: Self-pay

## 2023-04-18 ENCOUNTER — Other Ambulatory Visit (HOSPITAL_COMMUNITY): Payer: Self-pay

## 2023-04-18 ENCOUNTER — Other Ambulatory Visit: Payer: Self-pay

## 2023-04-19 ENCOUNTER — Other Ambulatory Visit: Payer: Self-pay

## 2023-04-19 MED ORDER — METOPROLOL SUCCINATE ER 25 MG PO TB24
25.0000 mg | ORAL_TABLET | Freq: Every day | ORAL | 12 refills | Status: DC
  Filled 2023-04-19: qty 30, 30d supply, fill #0
  Filled 2023-05-20: qty 90, 90d supply, fill #1
  Filled 2023-08-19: qty 90, 90d supply, fill #2
  Filled 2023-11-17: qty 90, 90d supply, fill #3
  Filled 2024-02-20: qty 90, 90d supply, fill #4

## 2023-04-22 DIAGNOSIS — S52501D Unspecified fracture of the lower end of right radius, subsequent encounter for closed fracture with routine healing: Secondary | ICD-10-CM | POA: Diagnosis not present

## 2023-04-29 ENCOUNTER — Other Ambulatory Visit: Payer: Self-pay

## 2023-05-08 DIAGNOSIS — S52501D Unspecified fracture of the lower end of right radius, subsequent encounter for closed fracture with routine healing: Secondary | ICD-10-CM | POA: Diagnosis not present

## 2023-05-10 ENCOUNTER — Other Ambulatory Visit: Payer: Self-pay

## 2023-05-14 ENCOUNTER — Other Ambulatory Visit (HOSPITAL_COMMUNITY): Payer: Self-pay

## 2023-05-16 ENCOUNTER — Other Ambulatory Visit (HOSPITAL_COMMUNITY): Payer: Self-pay

## 2023-05-20 ENCOUNTER — Other Ambulatory Visit: Payer: Self-pay

## 2023-05-21 ENCOUNTER — Other Ambulatory Visit: Payer: Self-pay

## 2023-05-22 ENCOUNTER — Other Ambulatory Visit (HOSPITAL_COMMUNITY): Payer: Self-pay

## 2023-05-22 ENCOUNTER — Other Ambulatory Visit: Payer: Self-pay

## 2023-05-22 DIAGNOSIS — S52501D Unspecified fracture of the lower end of right radius, subsequent encounter for closed fracture with routine healing: Secondary | ICD-10-CM | POA: Diagnosis not present

## 2023-05-22 MED ORDER — MELOXICAM 15 MG PO TABS
15.0000 mg | ORAL_TABLET | Freq: Every day | ORAL | 2 refills | Status: DC
  Filled 2023-05-22: qty 30, 30d supply, fill #0
  Filled 2023-07-08: qty 30, 30d supply, fill #1

## 2023-05-23 ENCOUNTER — Other Ambulatory Visit: Payer: Self-pay

## 2023-05-26 ENCOUNTER — Other Ambulatory Visit: Payer: Self-pay | Admitting: Family Medicine

## 2023-05-27 ENCOUNTER — Other Ambulatory Visit: Payer: Self-pay

## 2023-05-27 ENCOUNTER — Other Ambulatory Visit: Payer: Self-pay | Admitting: Family Medicine

## 2023-05-28 ENCOUNTER — Other Ambulatory Visit: Payer: Self-pay

## 2023-05-30 ENCOUNTER — Other Ambulatory Visit: Payer: Self-pay

## 2023-05-30 MED FILL — Nortriptyline HCl Cap 10 MG: ORAL | 90 days supply | Qty: 90 | Fill #0 | Status: AC

## 2023-06-04 ENCOUNTER — Other Ambulatory Visit: Payer: Self-pay

## 2023-06-04 DIAGNOSIS — S52501D Unspecified fracture of the lower end of right radius, subsequent encounter for closed fracture with routine healing: Secondary | ICD-10-CM | POA: Diagnosis not present

## 2023-06-06 ENCOUNTER — Other Ambulatory Visit (HOSPITAL_COMMUNITY): Payer: Self-pay

## 2023-06-06 DIAGNOSIS — S52501D Unspecified fracture of the lower end of right radius, subsequent encounter for closed fracture with routine healing: Secondary | ICD-10-CM | POA: Diagnosis not present

## 2023-06-10 DIAGNOSIS — S52501D Unspecified fracture of the lower end of right radius, subsequent encounter for closed fracture with routine healing: Secondary | ICD-10-CM | POA: Diagnosis not present

## 2023-06-13 ENCOUNTER — Other Ambulatory Visit (HOSPITAL_COMMUNITY): Payer: Self-pay

## 2023-06-13 ENCOUNTER — Other Ambulatory Visit: Payer: Self-pay

## 2023-06-13 DIAGNOSIS — S52501D Unspecified fracture of the lower end of right radius, subsequent encounter for closed fracture with routine healing: Secondary | ICD-10-CM | POA: Diagnosis not present

## 2023-06-13 MED ORDER — GABAPENTIN 300 MG PO CAPS
300.0000 mg | ORAL_CAPSULE | Freq: Three times a day (TID) | ORAL | 2 refills | Status: DC
  Filled 2023-06-13: qty 90, 30d supply, fill #0

## 2023-06-17 ENCOUNTER — Other Ambulatory Visit (HOSPITAL_COMMUNITY): Payer: Self-pay

## 2023-06-17 DIAGNOSIS — S52501D Unspecified fracture of the lower end of right radius, subsequent encounter for closed fracture with routine healing: Secondary | ICD-10-CM | POA: Diagnosis not present

## 2023-06-18 ENCOUNTER — Other Ambulatory Visit: Payer: Self-pay

## 2023-06-19 ENCOUNTER — Other Ambulatory Visit: Payer: Self-pay

## 2023-06-20 ENCOUNTER — Encounter (INDEPENDENT_AMBULATORY_CARE_PROVIDER_SITE_OTHER): Payer: Self-pay

## 2023-06-20 DIAGNOSIS — S52501D Unspecified fracture of the lower end of right radius, subsequent encounter for closed fracture with routine healing: Secondary | ICD-10-CM | POA: Diagnosis not present

## 2023-06-25 DIAGNOSIS — M810 Age-related osteoporosis without current pathological fracture: Secondary | ICD-10-CM | POA: Diagnosis not present

## 2023-06-25 DIAGNOSIS — E039 Hypothyroidism, unspecified: Secondary | ICD-10-CM | POA: Diagnosis not present

## 2023-06-25 DIAGNOSIS — G43909 Migraine, unspecified, not intractable, without status migrainosus: Secondary | ICD-10-CM | POA: Diagnosis not present

## 2023-06-25 DIAGNOSIS — E559 Vitamin D deficiency, unspecified: Secondary | ICD-10-CM | POA: Diagnosis not present

## 2023-06-25 DIAGNOSIS — R000 Tachycardia, unspecified: Secondary | ICD-10-CM | POA: Diagnosis not present

## 2023-06-25 DIAGNOSIS — K760 Fatty (change of) liver, not elsewhere classified: Secondary | ICD-10-CM | POA: Diagnosis not present

## 2023-06-25 DIAGNOSIS — E782 Mixed hyperlipidemia: Secondary | ICD-10-CM | POA: Diagnosis not present

## 2023-06-25 DIAGNOSIS — R7302 Impaired glucose tolerance (oral): Secondary | ICD-10-CM | POA: Diagnosis not present

## 2023-06-25 DIAGNOSIS — E213 Hyperparathyroidism, unspecified: Secondary | ICD-10-CM | POA: Diagnosis not present

## 2023-06-25 DIAGNOSIS — S52501D Unspecified fracture of the lower end of right radius, subsequent encounter for closed fracture with routine healing: Secondary | ICD-10-CM | POA: Diagnosis not present

## 2023-06-25 DIAGNOSIS — I10 Essential (primary) hypertension: Secondary | ICD-10-CM | POA: Diagnosis not present

## 2023-06-27 DIAGNOSIS — S52501D Unspecified fracture of the lower end of right radius, subsequent encounter for closed fracture with routine healing: Secondary | ICD-10-CM | POA: Diagnosis not present

## 2023-07-01 DIAGNOSIS — S52501D Unspecified fracture of the lower end of right radius, subsequent encounter for closed fracture with routine healing: Secondary | ICD-10-CM | POA: Diagnosis not present

## 2023-07-08 ENCOUNTER — Other Ambulatory Visit: Payer: Self-pay

## 2023-07-11 DIAGNOSIS — S52501D Unspecified fracture of the lower end of right radius, subsequent encounter for closed fracture with routine healing: Secondary | ICD-10-CM | POA: Diagnosis not present

## 2023-07-15 ENCOUNTER — Other Ambulatory Visit (HOSPITAL_COMMUNITY): Payer: Self-pay

## 2023-07-16 DIAGNOSIS — S52501D Unspecified fracture of the lower end of right radius, subsequent encounter for closed fracture with routine healing: Secondary | ICD-10-CM | POA: Diagnosis not present

## 2023-07-22 ENCOUNTER — Other Ambulatory Visit (HOSPITAL_COMMUNITY): Payer: Self-pay

## 2023-07-26 DIAGNOSIS — S52501D Unspecified fracture of the lower end of right radius, subsequent encounter for closed fracture with routine healing: Secondary | ICD-10-CM | POA: Diagnosis not present

## 2023-07-27 ENCOUNTER — Encounter (HOSPITAL_COMMUNITY): Payer: Self-pay

## 2023-08-02 ENCOUNTER — Telehealth: Payer: Self-pay

## 2023-08-02 ENCOUNTER — Ambulatory Visit (INDEPENDENT_AMBULATORY_CARE_PROVIDER_SITE_OTHER): Payer: 59 | Admitting: Family Medicine

## 2023-08-02 ENCOUNTER — Encounter: Payer: Self-pay | Admitting: Family Medicine

## 2023-08-02 VITALS — BP 122/78 | HR 72 | Temp 98.4°F | Ht 63.0 in | Wt 178.6 lb

## 2023-08-02 DIAGNOSIS — J989 Respiratory disorder, unspecified: Secondary | ICD-10-CM | POA: Diagnosis not present

## 2023-08-02 DIAGNOSIS — E669 Obesity, unspecified: Secondary | ICD-10-CM | POA: Diagnosis not present

## 2023-08-02 DIAGNOSIS — Z0001 Encounter for general adult medical examination with abnormal findings: Secondary | ICD-10-CM

## 2023-08-02 DIAGNOSIS — E7800 Pure hypercholesterolemia, unspecified: Secondary | ICD-10-CM

## 2023-08-02 DIAGNOSIS — Z1159 Encounter for screening for other viral diseases: Secondary | ICD-10-CM

## 2023-08-02 LAB — HEMOGLOBIN A1C: Hgb A1c MFr Bld: 6.3 % (ref 4.6–6.5)

## 2023-08-02 LAB — COMPREHENSIVE METABOLIC PANEL
ALT: 38 U/L — ABNORMAL HIGH (ref 0–35)
AST: 31 U/L (ref 0–37)
Albumin: 4.2 g/dL (ref 3.5–5.2)
Alkaline Phosphatase: 101 U/L (ref 39–117)
BUN: 19 mg/dL (ref 6–23)
CO2: 26 meq/L (ref 19–32)
Calcium: 9.6 mg/dL (ref 8.4–10.5)
Chloride: 108 meq/L (ref 96–112)
Creatinine, Ser: 0.85 mg/dL (ref 0.40–1.20)
GFR: 76.03 mL/min (ref >60.00)
Glucose, Bld: 122 mg/dL — ABNORMAL HIGH (ref 70–99)
Potassium: 4.2 meq/L (ref 3.5–5.1)
Sodium: 143 meq/L (ref 135–145)
Total Bilirubin: 0.6 mg/dL (ref 0.2–1.2)
Total Protein: 6.2 g/dL (ref 6.0–8.3)

## 2023-08-02 LAB — LIPID PANEL
Cholesterol: 208 mg/dL — ABNORMAL HIGH (ref 0–200)
HDL: 34.2 mg/dL — ABNORMAL LOW (ref >39.00)
LDL Cholesterol: 129 mg/dL — ABNORMAL HIGH (ref 0–99)
NonHDL: 174.2
Total CHOL/HDL Ratio: 6
Triglycerides: 225 mg/dL — ABNORMAL HIGH (ref 0.0–149.0)
VLDL: 45 mg/dL — ABNORMAL HIGH (ref 0.0–40.0)

## 2023-08-02 NOTE — Progress Notes (Signed)
Marikay Alar, MD Phone: [phone]  Lindsey Carson is a 57 y.o. female who presents today for CPE.  Diet: Has been working on [employer] with more lean meats and vegetables, lower portion sizes, no soda or sweet tea, tries to limit sweets Exercise: None, no she broke one of her arms and had trouble recovering, notes she is close to normal now Pap smear: Up-to-date through gynecology Colonoscopy: Up-to-date, 5-year recall Mammogram: Reports up-to-date through gynecology Family history-  Colon cancer: Maternal uncle and maternal grandfather  Breast cancer: no  Ovarian cancer: no Menses: postmenopausal Vaccines-   Flu: through work  Tetanus: through work  Shingles: due  COVID19: x2-3 HIV screening: reports done in the past Hep C Screening: due Tobacco use: no Alcohol use: no Illicit Drug use: no Dentist: yes Ophthalmology: no  Reports she was sick with a respiratory illness a couple of weeks ago.  Notes it was likely COVID as her husband tested positive with similar symptoms.  Since then she has had a little bit of right ear discomfort and some trouble swallowing that she wonders if this could be related to residual congestion.  She notes neither of these things was going on prior to her illness.   Active Ambulatory Problems    Diagnosis Date Noted  . Allergic rhinitis 10/27/2010  . Lumbar disc herniation with radiculopathy 09/08/2012  . Encounter for general adult medical examination with abnormal findings 12/20/2015  . Skin tag 12/20/2015  . Hypertension 12/27/2016  . Exertional dyspnea 12/27/2016  . Seborrheic keratosis 06/27/2017  . Hyperparathyroidism (HCC) 01/16/2018  . Multinodular goiter 01/16/2018  . Tension headache 04/06/2019  . Chronic bilateral low back pain with bilateral sciatica 11/20/2019  . DDD (degenerative disc disease), lumbar 01/28/2020  . Previous back surgery 01/28/2020  . Facet arthropathy, lumbar 01/28/2020  . Osteoporosis 05/23/2021  .  Anxiety and depression 06/09/2021  . Obesity (BMI 30.0-34.9) 04/09/2022  . Elevated alkaline phosphatase level 07/27/2022  . Hyperlipidemia 07/27/2022  . Hypothyroidism 07/27/2022  . Family history of hemochromatosis 07/27/2022  . Mouth sores 10/26/2022  . Right calf pain 10/26/2022  . Respiratory illness 01/29/2023   Resolved Ambulatory Problems    Diagnosis Date Noted  . Abdominal pain, epigastric 11/25/2015  . Encounter to establish care 11/25/2015  . Overweight (BMI 25.0-29.9) 06/27/2017  . Sciatica of right side 01/28/2020  . Chronic low back pain 01/28/2020   Past Medical History:  Diagnosis Date  . Allergy   . Anxiety   . Arthritis   . Chronic allergic rhinitis   . Chronic back pain   . Elevated blood pressure   . Headache   . Heart murmur   . Hemorrhoids   . Joint pain   . PONV (postoperative nausea and vomiting)   . Seasonal allergies   . Sinus infection   . Spinal fracture YRS AGO  . Thyroid disease     Family History  Problem Relation Age of Onset  . Diabetes Mother   . Hypertension Mother   . Hypothyroidism Mother   . Heart attack Father        MI  . Coronary artery disease Father   . Diabetes Father   . Hemachromatosis Father   . Diabetes Brother   . Hemachromatosis Brother   . Colon cancer Maternal Uncle   . Colon cancer Maternal Grandfather   . Colon polyps Neg Hx   . Crohn's disease Neg Hx   . Esophageal cancer Neg Hx   . Rectal cancer  Neg Hx   . Stomach cancer Neg Hx   . Ulcerative colitis Neg Hx     Social History   Socioeconomic History  . Marital status: Married    Spouse name: Not on file  . Number of children: Not on file  . Years of education: Not on file  . Highest education level: Bachelor's degree (e.g., BA, AB, BS)  Occupational History  . 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 Smoking status: Never  . Smokeless tobacco: Never  Vaping Use  . Vaping status: Never Used  Substance and Sexual  Activity  . Alcohol use: No    Alcohol/week: 0.0 standard drinks of alcohol  . Drug use: No  . Sexual activity: Yes    Birth control/protection: Post-menopausal  Other Topics Concern  . Not on file  Social History Narrative   Pt is married   She has a Energy manager degree    She has 2 daughters    Social Determinants of Corporate investment banker Strain: Low Risk  (01/28/2023)   Overall Financial Resource Strain (CARDIA)   . Difficulty of Paying Living Expenses: Not hard at all  Food Insecurity: No Food Insecurity (01/28/2023)   Hunger Vital Sign   . Worried About Programme researcher, broadcasting/film/video in the Last Year: Never true   . Ran Out of Food in the Last Year: Never true  Transportation Needs: No Transportation Needs (01/28/2023)   PRAPARE - Transportation   . Lack of Transportation (Medical): No   . Lack of Transportation (Non-Medical): No  Physical Activity: Insufficiently Active (01/28/2023)   Exercise Vital Sign   . Days of Exercise per Week: 2 days   . Minutes of Exercise per Session: 20 min  Stress: No Stress Concern Present (01/28/2023)   [employer] of Occupational Health - Occupational Stress Questionnaire   . Feeling of Stress : Only a little  Social Connections: Socially Integrated (01/28/2023)   Social Connection and Isolation Panel [NHANES]   . Frequency of Communication with Friends and Family: More than three times a week   . Frequency of Social Gatherings with Friends and Family: Twice a week   . Attends Religious Services: 1 to 4 times per year   . Active Member of Clubs or Organizations: Yes   . Attends Banker Meetings: 1 to 4 times per year   . Marital Status: Married  Catering manager Violence: Not on file    ROS  General:  Negative for nexplained weight loss, fever Skin: Negative for new or changing mole, sore that won't heal HEENT: Negative for trouble hearing, trouble seeing, ringing in ears, mouth sores, hoarseness, change in voice. CV:   Negative for chest pain, dyspnea, edema, palpitations Resp: Negative for cough, dyspnea, hemoptysis GI: Positive for trouble swallowing, negative for nausea, vomiting, diarrhea, constipation, abdominal pain, melena, hematochezia. GU: Negative for dysuria, incontinence, urinary hesitance, hematuria, vaginal or penile discharge, polyuria, sexual difficulty, lumps in testicle or breasts MSK: Negative for muscle cramps or aches, joint pain or swelling Neuro: Negative for headaches, weakness, numbness, dizziness, passing out/fainting Psych: Negative for depression, anxiety, memory problems  Objective  Physical Exam Vitals:   08/02/23 1109  BP: 122/78  Pulse: 72  Temp: 98.4 F (36.9 C)  SpO2: 97%    BP Readings from Last 3 Encounters:  08/02/23 122/78  03/07/23 120/73  01/29/23 124/76   Wt Readings from Last 3 Encounters:  08/02/23 178 lb 9.6 oz (81  kg)  03/07/23 172 lb (78 kg)  02/21/23 172 lb (78 kg)    Physical Exam Constitutional:      General: She is not in acute distress.    Appearance: She is not diaphoretic.  HENT:     Head: Normocephalic and atraumatic.     Right Ear: Tympanic membrane normal.     Left Ear: Tympanic membrane normal.  Cardiovascular:     Rate and Rhythm: Normal rate and regular rhythm.     Heart sounds: Normal heart sounds.  Pulmonary:     Effort: Pulmonary effort is normal.     Breath sounds: Normal breath sounds.  Abdominal:     General: Bowel sounds are normal. There is no distension.     Palpations: Abdomen is soft.     Tenderness: There is no abdominal tenderness.  Musculoskeletal:     Right lower leg: No edema.     Left lower leg: No edema.  Lymphadenopathy:     Cervical: No cervical adenopathy.  Skin:    General: Skin is warm and dry.  Neurological:     Mental Status: She is alert.  Psychiatric:        Mood and Affect: Mood normal.     Assessment/Plan:   Encounter for general adult medical examination with abnormal  findings Assessment & Plan: Physical exam completed.  Encouraged healthy diet and exercise.  Discussed try to add in exercise again.  Cancer screening is up-to-date.  She will confirm tetanus vaccine status with health at work.  She will get flu vaccine through work.  She defers further COVID vaccines.  She will consider getting the Shingrix vaccine.  Hepatitis C screening completed today.  I did encourage her to see an eye doctor.  Lab work as outlined.   Pure hypercholesterolemia -     Comprehensive metabolic panel -     Lipid panel  Obesity (BMI 30.0-34.9) -     Hemoglobin A1c  Need for hepatitis C screening test -     Hepatitis C antibody  Respiratory illness Assessment & Plan: It sounds as though the patient had COVID recently.  Suspect her right ear symptom and swallowing are possibly related to residual inflammation and congestion from this.  If not resolving over the next 2 weeks she will let us know.     Return in about 1 year (around 08/01/2024) for physical.   Marikay Alar, MD [facility]

## 2023-08-02 NOTE — Assessment & Plan Note (Signed)
Physical exam completed.  Encouraged healthy diet and exercise.  Discussed try to add in exercise again.  Cancer screening is up-to-date.  She will confirm tetanus vaccine status with health at work.  She will get flu vaccine through work.  She defers further COVID vaccines.  She will consider getting the Shingrix vaccine.  Hepatitis C screening completed today.  I did encourage her to see an eye doctor.  Lab work as outlined.

## 2023-08-02 NOTE — Telephone Encounter (Signed)
 Left message to call the office back regarding the lab results below.

## 2023-08-02 NOTE — Telephone Encounter (Signed)
-----   Message from Marikay Alar sent at 08/02/2023  4:06 PM EDT ----- Please let the patient know that her A1c remains in the prediabetic range.  She needs to work on diet and exercise for this.  One of her liver enzymes is minimally elevated.  Please confirm that she is not having any abdominal pain.  I would suggest rechecking this in a month.  If she develops abdominal pain during that timeframe she needs to let us know.  Her cholesterol is generally stable.  The 10-year ASCVD risk score (Arnett DK, et al., 2019) is: 4.6%   Values used to calculate the score:     Age: 57 years     Sex: Female     Is Non-Hispanic African American: No     Diabetic: No     Tobacco smoker: No     Systolic Blood Pressure: 122 mmHg     Is BP treated: Yes     HDL Cholesterol: 34.2 mg/dL     Total Cholesterol: 208 mg/dL

## 2023-08-02 NOTE — Assessment & Plan Note (Signed)
It sounds as though the patient had COVID recently.  Suspect her right ear symptom and swallowing are possibly related to residual inflammation and congestion from this.  If not resolving over the next 2 weeks she will let us know.

## 2023-08-03 LAB — HEPATITIS C ANTIBODY: Hepatitis C Ab: NONREACTIVE

## 2023-08-06 NOTE — Telephone Encounter (Signed)
Left message to call back to schedule a CMET & Lipid in one month

## 2023-08-06 NOTE — Telephone Encounter (Signed)
Patient called office back and note was read. Patient stated she is not having any abdominal pain. Front office was unable to schedule lab appointment, no orders.

## 2023-08-06 NOTE — Addendum Note (Signed)
Addended by: Prince Solian A on: 08/06/2023 11:32 AM   Modules accepted: Orders

## 2023-08-08 ENCOUNTER — Encounter (HOSPITAL_COMMUNITY): Payer: Self-pay

## 2023-08-08 ENCOUNTER — Other Ambulatory Visit (HOSPITAL_COMMUNITY): Payer: Self-pay

## 2023-08-08 ENCOUNTER — Other Ambulatory Visit: Payer: Self-pay

## 2023-08-08 ENCOUNTER — Ambulatory Visit: Payer: 59 | Attending: Family Medicine | Admitting: Pharmacist

## 2023-08-08 ENCOUNTER — Other Ambulatory Visit: Payer: Self-pay | Admitting: Pharmacist

## 2023-08-08 DIAGNOSIS — M810 Age-related osteoporosis without current pathological fracture: Secondary | ICD-10-CM | POA: Diagnosis not present

## 2023-08-08 DIAGNOSIS — E559 Vitamin D deficiency, unspecified: Secondary | ICD-10-CM | POA: Diagnosis not present

## 2023-08-08 DIAGNOSIS — Z79899 Other long term (current) drug therapy: Secondary | ICD-10-CM

## 2023-08-08 DIAGNOSIS — I10 Essential (primary) hypertension: Secondary | ICD-10-CM | POA: Diagnosis not present

## 2023-08-08 MED ORDER — PROLIA 60 MG/ML ~~LOC~~ SOSY
PREFILLED_SYRINGE | SUBCUTANEOUS | 1 refills | Status: DC
Start: 2023-08-08 — End: 2023-08-08

## 2023-08-08 MED ORDER — PROLIA 60 MG/ML ~~LOC~~ SOSY
PREFILLED_SYRINGE | SUBCUTANEOUS | 1 refills | Status: DC
  Filled 2023-08-08 – 2023-08-12 (×3): qty 1, 180d supply, fill #0
  Filled 2024-06-18: qty 1, 180d supply, fill #1
  Filled 2024-06-19: qty 1, 30d supply, fill #1

## 2023-08-08 NOTE — Progress Notes (Signed)
See separate encounter under today's date for documentation.  Butch Penny, PharmD, Patsy [place], CPP Clinical Pharmacist [facility] [phone]

## 2023-08-08 NOTE — Progress Notes (Signed)
S:  Patient presents for review of their specialty medication therapy.  Patient is about to start taking Prolia for osteoporosis. Patient is managed by Dr. Evlyn Kanner for this.   Adherence: has not yet started  Efficacy: has not yet started   Dosing: 60 mg once every 6 months subcutaneous  Dose adjustments: Renal: Monitor patients with severe impairment (CrCl <30 mL/minute or on dialysis) closely, as significant and prolonged hypocalcemia (incidence of 29% and potentially lasting weeks to months) and marked elevations of serum parathyroid hormone are serious risks in this population. Ensure adequate calcium and vitamin D intake/supplementation. CrCl >=30 mL/minute: No dosage adjustment necessary. CrCl <30 mL/minute: No dosage adjustment necessary; use in conjunction with guidance from patient's nephrology team. Hepatic: no dose adjustments (has not been studied)  Drug-drug interactions: none  Screening: TB test: completed  Hepatitis: completed   Monitoring: S/sx of infection: has not started S/sx of hypersensitivity: has not started S/sx of hypocalcemia/hypercalcemia: has not started Dermatitis/skin rash: has not started Peripheral edema: has not started HA:has not started GI upset: has not started  Other side effects: has not started  Last bone density study: 2023   O:      Lab Results  Component Value Date   WBC 5.3 04/06/2019   HGB 14.3 04/06/2019   HCT 40.6 04/06/2019   MCV 88.9 04/06/2019   PLT 158.0 04/06/2019      Chemistry      Component Value Date/Time   NA 143 08/02/2023 1132   K 4.2 08/02/2023 1132   CL 108 08/02/2023 1132   CO2 26 08/02/2023 1132   BUN 19 08/02/2023 1132   CREATININE 0.85 08/02/2023 1132      Component Value Date/Time   CALCIUM 9.6 08/02/2023 1132   ALKPHOS 101 08/02/2023 1132   AST 31 08/02/2023 1132   ALT 38 (H) 08/02/2023 1132   BILITOT 0.6 08/02/2023 1132       A/P: 1. Medication review: Patient currently on Prolia for  osteoporosis. Reviewed the medication with the patient, including the following: Prolia (denosumab) is a monoclonal antibody with affinity for nuclear factor-kappa ligand (RANKL). Prolia binds to RANKL and prevents osteoclast formation, leading to decreased bone resorption and increased bone mass in osteoporosis. Patient educated on purpose, proper use, and potential adverse effects of Prolia. The most common adverse effects are hypersensitivities, peripheral edema, dermatitis/skin rash, GI upset, HA, joint pain, and infection. There is the possibility of atypical femur fracture, serum calcium disturbances, and osteonecrosis of the jaw. Patients should monitor for and report hip, thigh, or groin pain. Additionally, patients should monitor for and report jaw pain, tooth/periodontal infection, toothache, and/or gingival ulceration/erosion. Prolia exists as a solution prefilled syringe for SQ administration. Administration: Denosumab is intended for SubQ route only and should not be administered IV, IM, or intradermally. Prior to administration, bring to room temperature in original container (allow to stand ~15 to 30 minutes); do not warm by any other method. Solution may contain trace amounts of translucent to white protein particles; do not use if cloudy, discolored (normal solution should be clear and colorless to pale yellow), or contains excessive particles or foreign matter. Avoid vigorous shaking. Administer via SubQ injection in the upper arm, upper thigh, or abdomen; should only be administered by a health care professional. No recommendations for any changes at this time.  Butch Penny, PharmD, Patsy [place], CPP Clinical Pharmacist [facility] [phone]

## 2023-08-09 ENCOUNTER — Other Ambulatory Visit: Payer: Self-pay

## 2023-08-09 DIAGNOSIS — S52501D Unspecified fracture of the lower end of right radius, subsequent encounter for closed fracture with routine healing: Secondary | ICD-10-CM | POA: Diagnosis not present

## 2023-08-09 NOTE — Telephone Encounter (Signed)
Patient is scheduled for 09/09/23.

## 2023-08-09 NOTE — Progress Notes (Signed)
Pharmacy Patient Advocate Encounter   Received notification from Patient Pharmacy that prior authorization for Prolia is required/requested.   Insurance verification completed.   The patient is insured through [facility] .   PA submitted via fax to [employer]. Status is pending.

## 2023-08-12 ENCOUNTER — Other Ambulatory Visit: Payer: Self-pay

## 2023-08-12 NOTE — Progress Notes (Signed)
Specialty Pharmacy Initial Fill Coordination Note  Lindsey Carson is a 57 y.o. female contacted today regarding refills of specialty medication(s) Denosumab   Patient requested Courier to Provider Office   Delivery date: 08/14/23   Verified address: [facility] [address] [place] [address]   Medication will be filled on 10/8.   Patient is aware of $0 copayment. Prolia copay card on file

## 2023-08-12 NOTE — Progress Notes (Signed)
Pharmacy Patient Advocate Encounter  Received notification from [facility] that Prior Authorization for Prolia has been APPROVED from 10/724 to 08/10/24-max of 2 fills   PA #/Case ID/Reference #: [id number]

## 2023-08-20 ENCOUNTER — Other Ambulatory Visit: Payer: Self-pay

## 2023-08-27 DIAGNOSIS — M810 Age-related osteoporosis without current pathological fracture: Secondary | ICD-10-CM | POA: Diagnosis not present

## 2023-08-27 DIAGNOSIS — E559 Vitamin D deficiency, unspecified: Secondary | ICD-10-CM | POA: Diagnosis not present

## 2023-08-27 DIAGNOSIS — I10 Essential (primary) hypertension: Secondary | ICD-10-CM | POA: Diagnosis not present

## 2023-09-05 ENCOUNTER — Other Ambulatory Visit: Payer: Self-pay

## 2023-09-05 MED ORDER — SHINGRIX 50 MCG/0.5ML IM SUSR
0.5000 mL | INTRAMUSCULAR | 1 refills | Status: AC
  Filled 2023-09-05: qty 0.5, 1d supply, fill #0

## 2023-09-09 ENCOUNTER — Other Ambulatory Visit (INDEPENDENT_AMBULATORY_CARE_PROVIDER_SITE_OTHER): Payer: 59

## 2023-09-09 DIAGNOSIS — E7800 Pure hypercholesterolemia, unspecified: Secondary | ICD-10-CM

## 2023-09-09 LAB — COMPREHENSIVE METABOLIC PANEL
ALT: 27 U/L (ref 0–35)
AST: 24 U/L (ref 0–37)
Albumin: 3.9 g/dL (ref 3.5–5.2)
Alkaline Phosphatase: 94 U/L (ref 39–117)
BUN: 17 mg/dL (ref 6–23)
CO2: 25 meq/L (ref 19–32)
Calcium: 8.9 mg/dL (ref 8.4–10.5)
Chloride: 110 meq/L (ref 96–112)
Creatinine, Ser: 0.8 mg/dL (ref 0.40–1.20)
GFR: 81.71 mL/min (ref >60.00)
Glucose, Bld: 95 mg/dL (ref 70–99)
Potassium: 4 meq/L (ref 3.5–5.1)
Sodium: 142 meq/L (ref 135–145)
Total Bilirubin: 0.5 mg/dL (ref 0.2–1.2)
Total Protein: 6.4 g/dL (ref 6.0–8.3)

## 2023-09-09 LAB — LIPID PANEL
Cholesterol: 198 mg/dL (ref 0–200)
HDL: 32.2 mg/dL — ABNORMAL LOW (ref >39.00)
LDL Cholesterol: 135 mg/dL — ABNORMAL HIGH (ref 0–99)
NonHDL: 166.29
Total CHOL/HDL Ratio: 6
Triglycerides: 154 mg/dL — ABNORMAL HIGH (ref 0.0–149.0)
VLDL: 30.8 mg/dL (ref 0.0–40.0)

## 2023-09-10 ENCOUNTER — Other Ambulatory Visit: Payer: Self-pay

## 2023-09-10 MED FILL — Nortriptyline HCl Cap 10 MG: ORAL | 90 days supply | Qty: 90 | Fill #1 | Status: AC

## 2023-09-12 ENCOUNTER — Other Ambulatory Visit: Payer: Self-pay

## 2023-11-04 ENCOUNTER — Ambulatory Visit: Payer: 59 | Admitting: Podiatry

## 2023-11-18 ENCOUNTER — Ambulatory Visit: Payer: 59 | Admitting: Podiatry

## 2023-11-18 ENCOUNTER — Other Ambulatory Visit: Payer: Self-pay

## 2023-11-27 ENCOUNTER — Ambulatory Visit: Payer: Self-pay | Admitting: Family Medicine

## 2023-11-27 ENCOUNTER — Telehealth: Payer: Self-pay

## 2023-11-27 ENCOUNTER — Other Ambulatory Visit: Payer: Self-pay

## 2023-11-27 ENCOUNTER — Telehealth: Payer: Self-pay | Admitting: Family Medicine

## 2023-11-27 ENCOUNTER — Emergency Department (HOSPITAL_BASED_OUTPATIENT_CLINIC_OR_DEPARTMENT_OTHER)
Admission: EM | Admit: 2023-11-27 | Discharge: 2023-11-27 | Disposition: A | Discharge status: Home / Self Care | Payer: Commercial Managed Care - PPO | Attending: Emergency Medicine | Admitting: Emergency Medicine

## 2023-11-27 ENCOUNTER — Emergency Department (HOSPITAL_BASED_OUTPATIENT_CLINIC_OR_DEPARTMENT_OTHER): Payer: Commercial Managed Care - PPO | Admitting: Radiology

## 2023-11-27 ENCOUNTER — Encounter (HOSPITAL_BASED_OUTPATIENT_CLINIC_OR_DEPARTMENT_OTHER): Payer: Self-pay | Admitting: Emergency Medicine

## 2023-11-27 DIAGNOSIS — R918 Other nonspecific abnormal finding of lung field: Secondary | ICD-10-CM | POA: Diagnosis not present

## 2023-11-27 DIAGNOSIS — R0789 Other chest pain: Secondary | ICD-10-CM | POA: Diagnosis not present

## 2023-11-27 DIAGNOSIS — Z91040 Latex allergy status: Secondary | ICD-10-CM | POA: Insufficient documentation

## 2023-11-27 DIAGNOSIS — R079 Chest pain, unspecified: Secondary | ICD-10-CM

## 2023-11-27 DIAGNOSIS — Z7982 Long term (current) use of aspirin: Secondary | ICD-10-CM | POA: Insufficient documentation

## 2023-11-27 LAB — COMPREHENSIVE METABOLIC PANEL
ALT: 25 U/L (ref 0–44)
AST: 22 U/L (ref 15–41)
Albumin: 4.3 g/dL (ref 3.5–5.0)
Alkaline Phosphatase: 75 U/L (ref 38–126)
Anion gap: 8 (ref 5–15)
BUN: 22 mg/dL — ABNORMAL HIGH (ref 6–20)
CO2: 26 mmol/L (ref 22–32)
Calcium: 10 mg/dL (ref 8.9–10.3)
Chloride: 107 mmol/L (ref 98–111)
Creatinine, Ser: 0.89 mg/dL (ref 0.44–1.00)
GFR, Estimated: >60 mL/min (ref >60)
Glucose, Bld: 101 mg/dL — ABNORMAL HIGH (ref 70–99)
Potassium: 3.7 mmol/L (ref 3.5–5.1)
Sodium: 141 mmol/L (ref 135–145)
Total Bilirubin: 0.4 mg/dL (ref 0.0–1.2)
Total Protein: 6.5 g/dL (ref 6.5–8.1)

## 2023-11-27 LAB — CBC WITH DIFFERENTIAL/PLATELET
Abs Immature Granulocytes: 0.03 10*3/uL (ref 0.00–0.07)
Basophils Absolute: 0 10*3/uL (ref 0.0–0.1)
Basophils Relative: 0 %
Eosinophils Absolute: 0.1 10*3/uL (ref 0.0–0.5)
Eosinophils Relative: 2 %
HCT: 39.3 % (ref 36.0–46.0)
Hemoglobin: 13.5 g/dL (ref 12.0–15.0)
Immature Granulocytes: 0 %
Lymphocytes Relative: 22 %
Lymphs Abs: 1.5 10*3/uL (ref 0.7–4.0)
MCH: 29 pg (ref 26.0–34.0)
MCHC: 34.4 g/dL (ref 30.0–36.0)
MCV: 84.5 fL (ref 80.0–100.0)
Monocytes Absolute: 0.7 10*3/uL (ref 0.1–1.0)
Monocytes Relative: 10 %
Neutro Abs: 4.4 10*3/uL (ref 1.7–7.7)
Neutrophils Relative %: 66 %
Platelets: 202 10*3/uL (ref 150–400)
RBC: 4.65 MIL/uL (ref 3.87–5.11)
RDW: 13 % (ref 11.5–15.5)
WBC: 6.7 10*3/uL (ref 4.0–10.5)
nRBC: 0 % (ref 0.0–0.2)

## 2023-11-27 LAB — TROPONIN I (HIGH SENSITIVITY): Troponin I (High Sensitivity): 3 ng/L (ref <18)

## 2023-11-27 LAB — D-DIMER, QUANTITATIVE: D-Dimer, Quant: <0.27 ug{FEU}/mL (ref 0.00–0.50)

## 2023-11-27 MED ORDER — NITROGLYCERIN 0.4 MG SL SUBL: 0.4000 mg | SUBLINGUAL_TABLET | SUBLINGUAL | Status: DC | PRN

## 2023-11-27 NOTE — Telephone Encounter (Signed)
Copied from CRM [id number]. Topic: Clinical - Red Word Triage >> Nov 27, 2023  1:31 PM Elizebeth Brooking wrote: [facility] that prompted transfer to Nurse Triage: Patient message nurse via mychart stating that she had chest pressure, nurse recommended her to call in so she can be triage  Chief Complaint: chest pressure Symptoms: chest pressure that radiates to back , SOB at times Frequency: started yesterday Pertinent Negatives: Patient denies n/v Disposition: [x] ED /[] Urgent Care (no appt availability in office) / [] Appointment(In office/virtual)/ []  [place] [facility]/ [] Home Care/ [] Refused Recommended Disposition /[] [facility]/ []  Follow-up with PCP Additional Notes: pt also stated having anxiety  Reason for Disposition  [1] Chest pain (or "angina") comes and goes AND [2] is happening more often (increasing in frequency) or getting worse (increasing in severity)  (Exception: Chest pains that last only a few seconds.)    Chest pain that also radiates to back at times  Answer Assessment - Initial Assessment Questions 1. LOCATION: "Where does it hurt?"       Upper chest / under clavicle area pressure 2. RADIATION: "Does the pain go anywhere else?" (e.g., into neck, jaw, arms, back)     Radiates to back at times 3. ONSET: "When did the chest pain begin?" (Minutes, hours or days)      yesterday 4. PATTERN: "Does the pain come and go, or has it been constant since it started?"  "Does it get worse with exertion?"     Comes and goes 5. DURATION: "How long does it last" (e.g., seconds, minutes, hours)     N/a 6. SEVERITY: "How bad is the pain?"  (e.g., Scale 1-10; mild, moderate, or severe)    - MILD (1-3): doesn't interfere with normal activities     - MODERATE (4-7): interferes with normal activities or awakens from sleep    - SEVERE (8-10): excruciating pain, unable to do any normal activities       Mild to moderate 7. CARDIAC RISK FACTORS: "Do you have any history of heart problems  or risk factors for heart disease?" (e.g., angina, prior heart attack; diabetes, high blood pressure, high cholesterol, smoker, or strong family history of heart disease)     N/a 8. PULMONARY RISK FACTORS: "Do you have any history of lung disease?"  (e.g., blood clots in lung, asthma, emphysema, birth control pills)     N/a 9. CAUSE: "What do you think is causing the chest pain?"     unknown 10. OTHER SYMPTOMS: "Do you have any other symptoms?" (e.g., dizziness, nausea, vomiting, sweating, fever, difficulty breathing, cough)       SOB while walking 11. PREGNANCY: "Is there any chance you are pregnant?" "When was your last menstrual period?"       N/a  Protocols used: Chest Pain-A-AH

## 2023-11-27 NOTE — Telephone Encounter (Signed)
Error

## 2023-11-27 NOTE — Telephone Encounter (Signed)
Noted.  Please try to follow-up again with the patient to make sure she gets evaluated for this.  Thanks.

## 2023-11-27 NOTE — Discharge Instructions (Signed)
Your blood work today is reassuring.  There are no acute findings.  Please follow-up with your PCP for further evaluation.

## 2023-11-27 NOTE — Telephone Encounter (Signed)
I left voicemail for patient letting her know that we received her message regarding chest pressure and we would like to follow-up with her.  I asked patient to please call us.

## 2023-11-27 NOTE — Telephone Encounter (Signed)
MyChart:    CHARLAYNE KOPECKY  P [facility] Admin27 minutes ago (12:08 PM)   Appointment for: TANAIJAH KINZLER ([id number]) Visit type: OFFICE VISIT [phone]) 11/29/2023 3:00 PM (20 minutes) with Marikay Alar in [facility]   Patient comments: CHest pressure since 0500 1/21..Thinking I may need an chest xray and possibly an EKG, no cough or congestion, No pain,,.Thanks

## 2023-11-27 NOTE — ED Provider Notes (Signed)
[place] EMERGENCY DEPARTMENT AT [facility] Provider Note   CSN: [id number] Arrival date & time: 11/27/23  1457     History  Chief Complaint  Patient presents with   Chest Pain    Lindsey Carson is a 58 y.o. female, history of hyperlipidemia, who presents to the ED secondary to chest heaviness, that woke her up out of her sleep starting at 5 AM yesterday.  She states she has had this persistent chest heaviness, for the last day and a half, that waxes and wanes in nature.  Can be so uncomfortable that it takes her breath away at times.  Denies any kind of nausea, vomiting, radiation of the pain.  Has not taken anything for the pain.  Initially thought it was her anxiety or panic attack, but states it has not gone away.  She states it just feels heavy, like someone is sitting on her chest.  No history of heart attacks. Denies any recent surgeries or trauma.   Home Medications Prior to Admission medications   Medication Sig Start Date End Date Taking? Authorizing Provider  aspirin EC 81 MG tablet Take 1 tablet (81 mg total) by mouth daily. Patient taking differently: Take 81 mg by mouth at bedtime. 01/10/17   Almond Lint, MD  cetirizine (ZYRTEC) 10 MG tablet Take 10 mg by mouth at bedtime.     [provider]  Cholecalciferol (VITAMIN D) 2000 units CAPS Take by mouth at bedtime.    [provider]  denosumab (PROLIA) 60 MG/ML SOSY injection inject 60mg  Subcutaneous every 6 months (start October 2024) 180 days 08/08/23   Quentin Angst, MD  Insulin Pen Needle 32G X 4 MM MISC Use as directed 05/17/22     levothyroxine (SYNTHROID) 50 MCG tablet Take 1 tablet (50 mcg total) by mouth daily 6 days of the week 02/18/23     Magnesium 250 MG TABS Take 250 mg by mouth at bedtime.    [provider]  meloxicam (MOBIC) 15 MG tablet Take 1 tablet (15 mg total) by mouth daily. 05/22/23     metoprolol succinate (TOPROL-XL) 25 MG 24 hr tablet Take 1 tablet (25 mg  total) by mouth daily. 04/19/23     nortriptyline (PAMELOR) 10 MG capsule Take 1 capsule (10 mg total) by mouth at bedtime. 05/30/23 05/29/24  Glori Luis, MD  triamcinolone (NASACORT) 55 MCG/ACT nasal inhaler Place 1 spray into the nose daily.     [provider]  Zoster Vaccine Adjuvanted [facility]) injection Inject 0.5 mLs into the muscle. 09/05/23   Judyann Munson, MD      Allergies    Augmentin [amoxicillin-pot clavulanate], Lisinopril, Prednisone, and Latex    Review of Systems   Review of Systems  Cardiovascular:  Positive for chest pain.  Gastrointestinal:  Negative for abdominal pain.    Physical Exam Updated Vital Signs BP 102/80 (BP Location: Right Arm)   Pulse 97   Temp 98.4 F (36.9 C) (Oral)   Resp 14   Wt 77.1 kg   SpO2 100%   BMI 30.11 kg/m  Physical Exam Vitals and nursing note reviewed.  Constitutional:      General: She is not in acute distress.    Appearance: She is well-developed.  HENT:     Head: Normocephalic and atraumatic.  Eyes:     Conjunctiva/sclera: Conjunctivae normal.  Cardiovascular:     Rate and Rhythm: Normal rate and regular rhythm.     Heart sounds: No murmur  heard. Pulmonary:     Effort: Pulmonary effort is normal. No respiratory distress.     Breath sounds: Normal breath sounds.  Abdominal:     Palpations: Abdomen is soft.     Tenderness: There is no abdominal tenderness.  Musculoskeletal:        General: No swelling.     Cervical back: Neck supple.  Skin:    General: Skin is warm and dry.     Capillary Refill: Capillary refill takes less than 2 seconds.  Neurological:     Mental Status: She is alert.  Psychiatric:        Mood and Affect: Mood normal.     ED Results / Procedures / Treatments   Labs (all labs ordered are listed, but only abnormal results are displayed) Labs Reviewed  COMPREHENSIVE METABOLIC PANEL - Abnormal; Notable for the following components:      Result Value   Glucose, Bld 101 (*)     BUN 22 (*)    All other components within normal limits  CBC WITH DIFFERENTIAL/PLATELET  D-DIMER, QUANTITATIVE  TROPONIN I (HIGH SENSITIVITY)  TROPONIN I (HIGH SENSITIVITY)    EKG None  Radiology DG Chest 2 View Result Date: 11/27/2023 CLINICAL DATA:  Chest pain EXAM: CHEST - 2 VIEW COMPARISON:  X-ray 01/20/2023. FINDINGS: No consolidation, pneumothorax or effusion. No edema. Normal cardiopericardial silhouette. There is some linear opacity left lung base likely scar or atelectasis. Degenerative changes along the spine. Overlapping cardiac leads. IMPRESSION: Hyperinflation. Mild left basilar scar or atelectasis. No consolidation or effusion. Electronically Signed   By: Karen Kays M.D.   On: 11/27/2023 16:05    Procedures Procedures    Medications Ordered in ED Medications  nitroGLYCERIN (NITROSTAT) SL tablet 0.4 mg (has no administration in time range)    ED Course/ Medical Decision Making/ A&P             HEART Score: 4                    Medical Decision Making Patient is a 59 year old female, here for chest pressure, this been going on since 5 AM yesterday.  She states it is in the upper left side of her chest, to the middle of her chest.  Not associated with any shortness of breath.  Denies any trauma, recent surgeries.  She is tachycardic, offered D-dimer, which she initially declined, as she is PERC positive, Dr. Elayne Snare, spoke to her, and she is willing to undergo testing to rule out PE.  We will obtain troponins and D-dimer for further evaluation.  Amount and/or Complexity of Data Reviewed Labs: ordered.    Details: D-dimer less than 0.27, troponin within normal limits Radiology: ordered.    Details: Chest x-ray clear ECG/medicine tests:  Decision-making details documented in ED Course.    Details: Sinus tachycardia Discussion of management or test interpretation with external provider(s): Discussed with patient, chest x-ray is clear, D-dimer less than 0.27,  troponin within normal limits.  Her symptoms have been going on for longer than 24 hours, thus no second troponin ordered.  Discussed with Dr. Elayne Snare, he is okay with discharge.  Heart score 4.  Instructed patient to follow-up with PCP, and instructed on return precautions  Risk Prescription drug management.    Final Clinical Impression(s) / ED Diagnoses Final diagnoses:  Chest pain, unspecified type    Rx / DC Orders ED Discharge Orders     None  Pete Pelt, PA 11/27/23 1749    Royanne Foots, DO 12/02/23 1143

## 2023-11-27 NOTE — Telephone Encounter (Signed)
Third attempt to call patient message left to call office. When  patient calls back please send to triage patient left message in my chart appointment having chest Pressure with out sickness,  or congestion.

## 2023-11-27 NOTE — ED Notes (Signed)
Patient transported to X-ray 

## 2023-11-27 NOTE — Telephone Encounter (Signed)
Copied from CRM [id number]. Topic: General - Call Back - No Documentation >> Nov 27, 2023  2:30 PM Lindsey Carson wrote: Reason for CRM: Patient is calling back to inform the office that she is headed to the ED now - she received a message from the office to confirm.

## 2023-11-27 NOTE — Telephone Encounter (Signed)
Left message to return call to our office. Pt needs to be triaged for chest pressure.

## 2023-11-27 NOTE — ED Triage Notes (Signed)
Pt c/o upper LT side "chest heaviness" that woke pt up yesterday at 0500. Denies radiation, denies n/v, endorses mild shob

## 2023-11-28 ENCOUNTER — Ambulatory Visit: Payer: Commercial Managed Care - PPO | Admitting: Podiatry

## 2023-11-28 ENCOUNTER — Telehealth: Payer: Self-pay

## 2023-11-28 ENCOUNTER — Other Ambulatory Visit: Payer: Self-pay

## 2023-11-28 ENCOUNTER — Encounter: Payer: Self-pay | Admitting: Podiatry

## 2023-11-28 DIAGNOSIS — Z79899 Other long term (current) drug therapy: Secondary | ICD-10-CM

## 2023-11-28 DIAGNOSIS — R079 Chest pain, unspecified: Secondary | ICD-10-CM

## 2023-11-28 DIAGNOSIS — H04123 Dry eye syndrome of bilateral lacrimal glands: Secondary | ICD-10-CM | POA: Diagnosis not present

## 2023-11-28 DIAGNOSIS — B351 Tinea unguium: Secondary | ICD-10-CM | POA: Diagnosis not present

## 2023-11-28 DIAGNOSIS — B880 Other acariasis: Secondary | ICD-10-CM | POA: Diagnosis not present

## 2023-11-28 MED ORDER — TERBINAFINE HCL 250 MG PO TABS
250.0000 mg | ORAL_TABLET | Freq: Every day | ORAL | 0 refills | Status: DC
  Filled 2023-11-28: qty 90, 90d supply, fill #0

## 2023-11-28 NOTE — Addendum Note (Signed)
Addended by: Birdie Sons, Shann Lewellyn G on: 11/28/2023 10:12 AM   Modules accepted: Orders

## 2023-11-28 NOTE — Telephone Encounter (Signed)
TSH has been ordered

## 2023-11-28 NOTE — Telephone Encounter (Signed)
Copied from CRM [id number]. Topic: Clinical - Request for Lab/Test Order >> Nov 28, 2023  8:59 AM Drema Balzarine wrote: Reason for CRM: Patient seen at ED yesterday and was recommended to get her thyroid level checked - asked if dr can put in order so results will be in for her appt tomorrow with Dr. Birdie Sons

## 2023-11-28 NOTE — Progress Notes (Signed)
Subjective:  Patient ID: Lindsey Carson, female    DOB: 1966-07-30,  MRN: [id number]  Chief Complaint  Patient presents with   Nail Problem    "I been having a lot of toenail pain.  Sometimes the covers bother them.  I want him to check my big toenail on my left foot too because my nail tech said I may have fungus."    58 y.o. female presents with the above complaint. Patient presents with left hallux thickened elongated dystrophic mycotic nail x 1.  She wanted get it evaluated she has not seen anyone else prior to seeing me denies any acute complaints.  She had a recent liver function test done which is normal.  She denies any liver issues   Review of Systems: Negative except as noted in the HPI. Denies N/V/F/Ch.  Past Medical History:  Diagnosis Date   Allergy    Anxiety    Arthritis    OA   Chronic allergic rhinitis    Severe, year long   Chronic back pain    HNP - history   Chronic low back pain 01/28/2020   Elevated blood pressure    History of   Facet arthropathy, lumbar 01/28/2020   Headache    Heart murmur    was told but doesn't require meds - never has been a problem   Hemorrhoids    History OF   Hypertension    OFF BP MEDS FOR 6 MONTHS   Joint pain    hands and knees, hips and shoulders   Osteoporosis    PONV (postoperative nausea and vomiting)    Previous back surgery 01/28/2020   Sciatica of right side 01/28/2020   Seasonal allergies    uses nasocort daily and zyrtec   Sinus infection    History of frequent   Spinal fracture YRS AGO   L5/S1   Thyroid disease     Current Outpatient Medications:    aspirin EC 81 MG tablet, Take 1 tablet (81 mg total) by mouth daily. (Patient taking differently: Take 81 mg by mouth at bedtime.), Disp: 90 tablet, Rfl: 3   cetirizine (ZYRTEC) 10 MG tablet, Take 10 mg by mouth at bedtime. , Disp: , Rfl:    Cholecalciferol (VITAMIN D) 2000 units CAPS, Take by mouth at bedtime., Disp: , Rfl:    denosumab (PROLIA) 60 MG/ML  SOSY injection, inject 60mg  Subcutaneous every 6 months (start October 2024) 180 days, Disp: 1 mL, Rfl: 1   levothyroxine (SYNTHROID) 50 MCG tablet, Take 1 tablet (50 mcg total) by mouth daily 6 days of the week, Disp: 90 tablet, Rfl: 3   Magnesium 250 MG TABS, Take 250 mg by mouth at bedtime., Disp: , Rfl:    metoprolol succinate (TOPROL-XL) 25 MG 24 hr tablet, Take 1 tablet (25 mg total) by mouth daily., Disp: 30 tablet, Rfl: 12   nortriptyline (PAMELOR) 10 MG capsule, Take 1 capsule (10 mg total) by mouth at bedtime., Disp: 90 capsule, Rfl: 1   terbinafine (LAMISIL) 250 MG tablet, Take 1 tablet (250 mg total) by mouth daily., Disp: 90 tablet, Rfl: 0   triamcinolone (NASACORT) 55 MCG/ACT nasal inhaler, Place 1 spray into the nose daily. , Disp: , Rfl:    Zoster Vaccine Adjuvanted (SHINGRIX) injection, Inject 0.5 mLs into the muscle., Disp: 0.5 mL, Rfl: 1   Insulin Pen Needle 32G X 4 MM MISC, Use as directed, Disp: 100 each, Rfl: 11   meloxicam (MOBIC) 15 MG tablet, Take 1  tablet (15 mg total) by mouth daily., Disp: 30 tablet, Rfl: 2  Social History   Tobacco Use  Smoking Status Never  Smokeless Tobacco Never    Allergies  Allergen Reactions   Augmentin [Amoxicillin-Pot Clavulanate] Diarrhea    Has patient had a PCN reaction causing immediate rash, facial/tongue/throat swelling, SOB or lightheadedness with hypotension: No Has patient had a PCN reaction causing severe rash involving mucus membranes or skin necrosis: No Has patient had a PCN reaction that required hospitalization: No Has patient had a PCN reaction occurring within the last 10 years: Yes If all of the above answers are "NO", then may proceed with Cephalosporin use.    Lisinopril Cough   Prednisone    Latex Rash   Objective:  There were no vitals filed for this visit. There is no height or weight on file to calculate BMI. Constitutional Well developed. Well nourished.  Vascular Dorsalis pedis pulses palpable  bilaterally. Posterior tibial pulses palpable bilaterally. Capillary refill normal to all digits.  No cyanosis or clubbing noted. Pedal hair growth normal.  Neurologic Normal speech. Oriented to person, place, and time. Epicritic sensation to light touch grossly present bilaterally.  Dermatologic Nails left thickened onychodystrophy mycotic toenails x 1 mild pain on palpation Skin within normal limits  Orthopedic: Normal joint ROM without pain or crepitus bilaterally. No visible deformities. No bony tenderness.   Radiographs: None Assessment:   1. Encounter for long-term (current) use of high-risk medication   2. Nail fungus   3. Onychomycosis due to dermatophyte    Plan:  Patient was evaluated and treated and all questions answered.  Left hallux onychomycosis Educated the patient on the etiology of onychomycosis and various treatment options associated with improving the fungal load.  I explained to the patient that there is 3 treatment options available to treat the onychomycosis including topical, p.o., laser treatment.  Patient elected to undergo p.o. options with Lamisil/terbinafine therapy.  In order for me to start the medication therapy, I explained to the patient the importance of evaluating the liver and obtaining the liver function test.  Once the liver function test comes back normal I will start him on 60-month course of Lamisil therapy.  Patient understood all risk and would like to proceed with Lamisil therapy.  I have asked the patient to immediately stop the Lamisil therapy if she has any reactions to it and call the office or go to the emergency room right away.  Patient states understanding -  No follow-ups on file.

## 2023-11-29 ENCOUNTER — Other Ambulatory Visit: Payer: Self-pay

## 2023-11-29 ENCOUNTER — Ambulatory Visit: Payer: Commercial Managed Care - PPO | Admitting: Family Medicine

## 2023-11-29 ENCOUNTER — Encounter: Payer: Self-pay | Admitting: Family Medicine

## 2023-11-29 VITALS — BP 142/94 | HR 91 | Temp 98.0°F | Resp 18 | Ht 63.0 in | Wt 170.5 lb

## 2023-11-29 DIAGNOSIS — R0789 Other chest pain: Secondary | ICD-10-CM | POA: Diagnosis not present

## 2023-11-29 DIAGNOSIS — I10 Essential (primary) hypertension: Secondary | ICD-10-CM

## 2023-11-29 MED ORDER — VALSARTAN 160 MG PO TABS
160.0000 mg | ORAL_TABLET | Freq: Every day | ORAL | 2 refills | Status: DC
  Filled 2023-11-29 (×2): qty 30, 30d supply, fill #0
  Filled 2023-12-26: qty 30, 30d supply, fill #1
  Filled 2024-01-27: qty 30, 30d supply, fill #2

## 2023-11-29 NOTE — Assessment & Plan Note (Signed)
Chronic issue with exacerbation.  We will add valsartan 160 mg daily and continue metoprolol 25 mg daily.  Follow-up in 10 days for recheck.

## 2023-11-29 NOTE — Assessment & Plan Note (Signed)
I suspect this is musculoskeletal although we will check a TSH and a CRP to evaluate for hyperthyroidism and pericarditis as potential causes.  Advised to seek medical attention if she develops any new or changing symptoms.  Discussed if it is musculoskeletal we could treat with ibuprofen and it should improve over the next 2 to 3 weeks.

## 2023-11-29 NOTE — Progress Notes (Signed)
Marikay Alar, MD Phone: [phone]  Lindsey Carson is a 58 y.o. female who presents today for follow-up.  Chest pain: Patient notes onset of chest pressure Tuesday morning.  She woke up and felt like there is an elephant on her chest and she had some shortness of breath.  It got worse on Wednesday.  She went to the emergency department and had a negative workup for this.  She does not feel overly anxious.  She still having discomfort that waxes and wanes.  She notes laying down and bending over makes it worse.  Blood pressure has been elevated most of this week.  Social History   Tobacco Use  Smoking Status Never  Smokeless Tobacco Never    Current Outpatient Medications on File Prior to Visit  Medication Sig Dispense Refill   aspirin EC 81 MG tablet Take 1 tablet (81 mg total) by mouth daily. (Patient taking differently: Take 81 mg by mouth at bedtime.) 90 tablet 3   cetirizine (ZYRTEC) 10 MG tablet Take 10 mg by mouth at bedtime.      Cholecalciferol (VITAMIN D) 2000 units CAPS Take by mouth at bedtime.     denosumab (PROLIA) 60 MG/ML SOSY injection inject 60mg  Subcutaneous every 6 months (start October 2024) 180 days 1 mL 1   levothyroxine (SYNTHROID) 50 MCG tablet Take 1 tablet (50 mcg total) by mouth daily 6 days of the week 90 tablet 3   Magnesium 250 MG TABS Take 250 mg by mouth at bedtime.     metoprolol succinate (TOPROL-XL) 25 MG 24 hr tablet Take 1 tablet (25 mg total) by mouth daily. 30 tablet 12   nortriptyline (PAMELOR) 10 MG capsule Take 1 capsule (10 mg total) by mouth at bedtime. 90 capsule 1   terbinafine (LAMISIL) 250 MG tablet Take 1 tablet (250 mg total) by mouth daily. 90 tablet 0   triamcinolone (NASACORT) 55 MCG/ACT nasal inhaler Place 1 spray into the nose daily.      Zoster Vaccine Adjuvanted [facility]) injection Inject 0.5 mLs into the muscle. 0.5 mL 1   No current facility-administered medications on file prior to visit.     ROS see history of  present illness  Objective  Physical Exam Vitals:   11/29/23 1506 11/29/23 1531  BP: (!) 130/110 (!) 142/94  Pulse: 91   Resp: 18   Temp: 98 F (36.7 C)   SpO2: 100%     BP Readings from Last 3 Encounters:  11/29/23 (!) 142/94  11/27/23 102/80  08/02/23 122/78   Wt Readings from Last 3 Encounters:  11/29/23 170 lb 8 oz (77.3 kg)  11/27/23 170 lb (77.1 kg)  08/02/23 178 lb 9.6 oz (81 kg)    Physical Exam Constitutional:      General: She is not in acute distress.    Appearance: She is not diaphoretic.  Cardiovascular:     Rate and Rhythm: Normal rate and regular rhythm.     Heart sounds: Normal heart sounds.  Pulmonary:     Effort: Pulmonary effort is normal.     Breath sounds: Normal breath sounds.  Chest:     Chest wall: Tenderness (Some tenderness at her upper costochondral joints) present.  Skin:    General: Skin is warm and dry.  Neurological:     Mental Status: She is alert.      Assessment/Plan: Please see individual problem list.  Chest pressure Assessment & Plan: I suspect this is musculoskeletal although we will check a TSH  and a CRP to evaluate for hyperthyroidism and pericarditis as potential causes.  Advised to seek medical attention if she develops any new or changing symptoms.  Discussed if it is musculoskeletal we could treat with ibuprofen and it should improve over the next 2 to 3 weeks.  Orders: -     C-reactive protein -     TSH  Primary hypertension Assessment & Plan: Chronic issue with exacerbation.  We will add valsartan 160 mg daily and continue metoprolol 25 mg daily.  Follow-up in 10 days for recheck.  Orders: -     Valsartan; Take 1 tablet (160 mg total) by mouth daily.  Dispense: 30 tablet; Refill: 2     Return in about 10 days (around 12/09/2023) for Recheck BP with Saul Fabiano, 3 months transfer of care.   Marikay Alar, MD [facility] Primary Care [facility]

## 2023-11-30 LAB — C-REACTIVE PROTEIN: CRP: 38.6 mg/L — ABNORMAL HIGH (ref <8.0)

## 2023-11-30 LAB — TSH: TSH: 2.4 m[IU]/L (ref 0.40–4.50)

## 2023-12-02 ENCOUNTER — Other Ambulatory Visit: Payer: Self-pay | Admitting: Family Medicine

## 2023-12-02 ENCOUNTER — Other Ambulatory Visit: Payer: Self-pay

## 2023-12-02 DIAGNOSIS — R079 Chest pain, unspecified: Secondary | ICD-10-CM

## 2023-12-03 ENCOUNTER — Telehealth: Payer: Self-pay

## 2023-12-03 NOTE — Telephone Encounter (Signed)
Copied from CRM [id number]. Topic: Clinical - Medication Question >> Dec 03, 2023 12:29 PM Sonny Dandy B wrote: Reason for CRM: pt returned a call for [place]. Pt is requesting a call back please call [phone]

## 2023-12-03 NOTE — Telephone Encounter (Signed)
-----   Message from Lindsey Carson sent at 12/02/2023 10:45 AM EST ----- Echo ordered and referral to cardiology placed. If she is not having any chest pain at this time then we can see what the echo shows, though if it recurs she needs to let us know.

## 2023-12-03 NOTE — Telephone Encounter (Signed)
Called pt and read Dr. Birdie Sons note to pt.

## 2023-12-03 NOTE — Telephone Encounter (Signed)
Left message to call the office back regarding Dr. Purvis Sheffield message below. Okay to give the message.

## 2023-12-04 ENCOUNTER — Telehealth: Payer: Self-pay

## 2023-12-04 NOTE — Telephone Encounter (Signed)
Left message to call the office back regarding Dr. Purvis Sheffield message below. Okay to give message.

## 2023-12-04 NOTE — Telephone Encounter (Signed)
-----   Message from Marikay Alar sent at 12/02/2023 10:45 AM EST ----- Echo ordered and referral to cardiology placed. If she is not having any chest pain at this time then we can see what the echo shows, though if it recurs she needs to let us know.

## 2023-12-05 ENCOUNTER — Encounter: Payer: Self-pay | Admitting: *Deleted

## 2023-12-05 ENCOUNTER — Telehealth: Payer: Self-pay

## 2023-12-05 NOTE — Telephone Encounter (Signed)
Copied from CRM [id number]. Topic: General - Other >> Dec 05, 2023 12:45 PM Corin V wrote: Reason for CRM: Please call patient back when the echo is ready to be scheduled.

## 2023-12-13 ENCOUNTER — Other Ambulatory Visit: Payer: Self-pay | Admitting: Family Medicine

## 2023-12-13 ENCOUNTER — Other Ambulatory Visit: Payer: Self-pay

## 2023-12-13 ENCOUNTER — Ambulatory Visit: Payer: Commercial Managed Care - PPO | Admitting: Family Medicine

## 2023-12-13 VITALS — BP 116/82 | HR 71 | Temp 98.4°F | Ht 63.0 in | Wt 175.4 lb

## 2023-12-13 DIAGNOSIS — R0609 Other forms of dyspnea: Secondary | ICD-10-CM

## 2023-12-13 DIAGNOSIS — I10 Essential (primary) hypertension: Secondary | ICD-10-CM | POA: Diagnosis not present

## 2023-12-13 LAB — BASIC METABOLIC PANEL
BUN: 16 mg/dL (ref 6–23)
CO2: 26 meq/L (ref 19–32)
Calcium: 8.9 mg/dL (ref 8.4–10.5)
Chloride: 108 meq/L (ref 96–112)
Creatinine, Ser: 0.81 mg/dL (ref 0.40–1.20)
GFR: 80.35 mL/min (ref >60.00)
Glucose, Bld: 108 mg/dL — ABNORMAL HIGH (ref 70–99)
Potassium: 4.3 meq/L (ref 3.5–5.1)
Sodium: 144 meq/L (ref 135–145)

## 2023-12-13 MED ORDER — NORTRIPTYLINE HCL 10 MG PO CAPS
10.0000 mg | ORAL_CAPSULE | Freq: Every day | ORAL | 0 refills | Status: DC
  Filled 2023-12-13: qty 90, 90d supply, fill #0

## 2023-12-13 NOTE — Patient Instructions (Signed)
 Nice to see you. Your goal blood pressure is less than 140/90.  If you start to run above that please let us  know.

## 2023-12-13 NOTE — Progress Notes (Signed)
 Camellia Her, MD Phone: [phone]  Lindsey Carson is a 58 y.o. female who presents today for f/u.  HYPERTENSION Disease Monitoring Home BP Monitoring 130s-140/80s Chest pain- no    Dyspnea- on exertion Medications Compliance-  taking valsartan  and metoprolol   Edema- no BMET    Component Value Date/Time   NA 141 11/27/2023 1533   K 3.7 11/27/2023 1533   CL 107 11/27/2023 1533   CO2 26 11/27/2023 1533   GLUCOSE 101 (H) 11/27/2023 1533   BUN 22 (H) 11/27/2023 1533   CREATININE 0.89 11/27/2023 1533   CALCIUM  10.0 11/27/2023 1533   GFRNONAA >60 11/27/2023 1533   GFRAA >60 08/27/2018 1411   The 10-year ASCVD risk score (Arnett DK, et al., 2019) is: 4.2%   Values used to calculate the score:     Age: 27 years     Sex: Female     Is Non-Hispanic African American: No     Diabetic: No     Tobacco smoker: No     Systolic Blood Pressure: 116 mmHg     Is BP treated: Yes     HDL Cholesterol: 32.2 mg/dL     Total Cholesterol: 198 mg/dL   Social History   Tobacco Use  Smoking Status Never  Smokeless Tobacco Never    Current Outpatient Medications on File Prior to Visit  Medication Sig Dispense Refill   aspirin  EC 81 MG tablet Take 1 tablet (81 mg total) by mouth daily. (Patient taking differently: Take 81 mg by mouth at bedtime.) 90 tablet 3   cetirizine (ZYRTEC) 10 MG tablet Take 10 mg by mouth at bedtime.      Cholecalciferol (VITAMIN D ) 2000 units CAPS Take by mouth at bedtime.     denosumab  (PROLIA ) 60 MG/ML SOSY injection inject 60mg  Subcutaneous every 6 months (start October 2024) 180 days 1 mL 1   levothyroxine  (SYNTHROID ) 50 MCG tablet Take 1 tablet (50 mcg total) by mouth daily 6 days of the week 90 tablet 3   Magnesium 250 MG TABS Take 250 mg by mouth at bedtime.     metoprolol  succinate (TOPROL -XL) 25 MG 24 hr tablet Take 1 tablet (25 mg total) by mouth daily. 30 tablet 12   nortriptyline  (PAMELOR ) 10 MG capsule Take 1 capsule (10 mg total) by mouth at bedtime.  90 capsule 1   terbinafine  (LAMISIL ) 250 MG tablet Take 1 tablet (250 mg total) by mouth daily. 90 tablet 0   triamcinolone  (NASACORT ) 55 MCG/ACT nasal inhaler Place 1 spray into the nose daily.      valsartan  (DIOVAN ) 160 MG tablet Take 1 tablet (160 mg total) by mouth daily. 30 tablet 2   Zoster Vaccine Adjuvanted (SHINGRIX ) injection Inject 0.5 mLs into the muscle. 0.5 mL 1   No current facility-administered medications on file prior to visit.     ROS see history of present illness  Objective  Physical Exam Vitals:   12/13/23 0857  BP: 116/82  Pulse: 71  Temp: 98.4 F (36.9 C)  SpO2: 97%    BP Readings from Last 3 Encounters:  12/13/23 116/82  11/29/23 (!) 142/94  11/27/23 102/80   Wt Readings from Last 3 Encounters:  12/13/23 175 lb 6.4 oz (79.6 kg)  11/29/23 170 lb 8 oz (77.3 kg)  11/27/23 170 lb (77.1 kg)    Physical Exam Constitutional:      General: She is not in acute distress.    Appearance: She is not diaphoretic.  Cardiovascular:  Rate and Rhythm: Normal rate and regular rhythm.     Heart sounds: Normal heart sounds.  Pulmonary:     Effort: Pulmonary effort is normal.     Breath sounds: Normal breath sounds.  Musculoskeletal:     Right lower leg: No edema.     Left lower leg: No edema.  Skin:    General: Skin is warm and dry.  Neurological:     Mental Status: She is alert.      Assessment/Plan: Please see individual problem list.  Primary hypertension Assessment & Plan: Chronic issue.  At goal.  Patient will continue valsartan  160 mg daily and metoprolol  25 mg daily.  Check labs today.  Orders: -     Basic metabolic panel  Exertional dyspnea Assessment & Plan: Chronic issue.  This may be related to deconditioning.  She is going to have her echo completed given the dyspnea on exertion and her prior chest pain in combination with her elevated CRP.  Advised to hold off on excessive exercise until this evaluation is  completed.      Return for As scheduled.   Camellia Her, MD [facility] Primary Care [facility]

## 2023-12-13 NOTE — Assessment & Plan Note (Signed)
 Chronic issue.  At goal.  Patient will continue valsartan  160 mg daily and metoprolol  25 mg daily.  Check labs today.

## 2023-12-13 NOTE — Assessment & Plan Note (Signed)
 Chronic issue.  This may be related to deconditioning.  She is going to have her echo completed given the dyspnea on exertion and her prior chest pain in combination with her elevated CRP.  Advised to hold off on excessive exercise until this evaluation is completed.

## 2023-12-16 ENCOUNTER — Encounter: Payer: Self-pay | Admitting: Family Medicine

## 2023-12-26 ENCOUNTER — Other Ambulatory Visit: Payer: Self-pay

## 2023-12-26 ENCOUNTER — Ambulatory Visit: Payer: Commercial Managed Care - PPO

## 2024-01-10 ENCOUNTER — Ambulatory Visit: Payer: Commercial Managed Care - PPO | Admitting: Cardiology

## 2024-01-10 DIAGNOSIS — H04123 Dry eye syndrome of bilateral lacrimal glands: Secondary | ICD-10-CM | POA: Diagnosis not present

## 2024-01-10 DIAGNOSIS — B880 Other acariasis: Secondary | ICD-10-CM | POA: Diagnosis not present

## 2024-01-20 ENCOUNTER — Ambulatory Visit: Payer: Commercial Managed Care - PPO | Attending: Family Medicine

## 2024-01-20 DIAGNOSIS — R079 Chest pain, unspecified: Secondary | ICD-10-CM | POA: Diagnosis not present

## 2024-01-20 LAB — ECHOCARDIOGRAM COMPLETE
AV Mean grad: 6 mmHg
AV Peak grad: 10.9 mmHg
Ao pk vel: 1.65 m/s
Area-P 1/2: 3.21 cm2

## 2024-01-27 ENCOUNTER — Other Ambulatory Visit: Payer: Self-pay

## 2024-01-29 ENCOUNTER — Other Ambulatory Visit (HOSPITAL_COMMUNITY): Payer: Self-pay

## 2024-01-31 ENCOUNTER — Other Ambulatory Visit (HOSPITAL_COMMUNITY): Payer: Self-pay

## 2024-02-03 ENCOUNTER — Other Ambulatory Visit: Payer: Self-pay

## 2024-02-03 ENCOUNTER — Other Ambulatory Visit (HOSPITAL_COMMUNITY): Payer: Self-pay

## 2024-02-06 ENCOUNTER — Ambulatory Visit: Payer: Commercial Managed Care - PPO | Attending: Cardiology | Admitting: Cardiology

## 2024-02-06 ENCOUNTER — Encounter: Payer: Self-pay | Admitting: Cardiology

## 2024-02-06 ENCOUNTER — Other Ambulatory Visit: Payer: Self-pay

## 2024-02-06 VITALS — BP 110/68 | HR 81 | Ht 63.0 in | Wt 177.6 lb

## 2024-02-06 DIAGNOSIS — R0609 Other forms of dyspnea: Secondary | ICD-10-CM | POA: Diagnosis not present

## 2024-02-06 DIAGNOSIS — I10 Essential (primary) hypertension: Secondary | ICD-10-CM | POA: Diagnosis not present

## 2024-02-06 DIAGNOSIS — R072 Precordial pain: Secondary | ICD-10-CM

## 2024-02-06 DIAGNOSIS — E782 Mixed hyperlipidemia: Secondary | ICD-10-CM | POA: Diagnosis not present

## 2024-02-06 DIAGNOSIS — Z79899 Other long term (current) drug therapy: Secondary | ICD-10-CM | POA: Diagnosis not present

## 2024-02-06 MED ORDER — METOPROLOL TARTRATE 100 MG PO TABS
100.0000 mg | ORAL_TABLET | Freq: Once | ORAL | 0 refills | Status: DC
  Filled 2024-02-06: qty 1, 1d supply, fill #0

## 2024-02-06 NOTE — Progress Notes (Signed)
 Cardiology Office Note:    Date:  02/06/2024   ID:  Lindsey Carson, DOB 11-Jul-1966, MRN [id number]  PCP:  Glori Luis, MD (Inactive)   [facility] Providers Cardiologist:  None     Referring MD: Glori Luis, MD   No chief complaint on file.  Lindsey Carson is a 58 y.o. female who is being seen today for the evaluation of chest pressure at the request of Glori Luis, MD.   History of Present Illness:    Lindsey Carson is a 58 y.o. female with a hx of hypertension, hypothyroidism who presents due to dyspnea on exertion and chest pressure.  Patient was at home when she noticed on and off chest pressure about 2 months ago prompting her to go to to the ED.  Workup was unrevealing.  Also endorsed shortness of breath with exertion especially going up stairs.  She attributes shortness of breath to deconditioning.  Her mother was diagnosed with coronary artery disease in her 54s.  Father also had CAD in his 74s, apparently died from a heart attack.  She denies smoking.  Echocardiogram 01/2024 EF 60 to 65%, impaired relaxation.    Past Medical History:  Diagnosis Date   Allergy    Anxiety    Arthritis    OA   Chronic allergic rhinitis    Severe, year long   Chronic back pain    HNP - history   Chronic low back pain 01/28/2020   Elevated blood pressure    History of   Facet arthropathy, lumbar 01/28/2020   Headache    Heart murmur    was told but doesn't require meds - never has been a problem   Hemorrhoids    History OF   Hypertension    OFF BP MEDS FOR 6 MONTHS   Joint pain    hands and knees, hips and shoulders   Osteoporosis    PONV (postoperative nausea and vomiting)    Previous back surgery 01/28/2020   Sciatica of right side 01/28/2020   Seasonal allergies    uses nasocort daily and zyrtec   Sinus infection    History of frequent   Spinal fracture YRS AGO   L5/S1   Thyroid disease     Past Surgical History:  Procedure Laterality Date    CESAREAN SECTION  1994 and 1998   x 2    COLONSCOPY  08/2017   DILATION AND CURETTAGE OF UTERUS  08/22/2017   LUMBAR LAMINECTOMY/DECOMPRESSION MICRODISCECTOMY  09/08/2012   Procedure: LUMBAR LAMINECTOMY/DECOMPRESSION MICRODISCECTOMY 1 LEVEL;  Surgeon: Temple Pacini, MD;  Location: [facility];  Service: Neurosurgery;  Laterality: Left;  left Lumbar Five-Sacral one laminectomy/microdiscectomy   PARATHYROIDECTOMY Left 09/04/2018   Procedure: LEFT INFERIOR PARATHYROIDECTOMY WITH  NECK EXPLORATION;  Surgeon: Darnell Level, MD;  Location: [facility];  Service: General;  Laterality: Left;   WISDOM TOOTH EXTRACTION  1984    Current Medications: Current Meds  Medication Sig   aspirin EC 81 MG tablet Take 1 tablet (81 mg total) by mouth daily. (Patient taking differently: Take 81 mg by mouth at bedtime.)   cetirizine (ZYRTEC) 10 MG tablet Take 10 mg by mouth at bedtime.    Cholecalciferol (VITAMIN D) 2000 units CAPS Take by mouth at bedtime.   denosumab (PROLIA) 60 MG/ML SOSY injection inject 60mg  Subcutaneous every 6 months (start October 2024) 180 days   levothyroxine (SYNTHROID) 50 MCG tablet Take 1 tablet (50 mcg total) by mouth  daily 6 days of the week   Magnesium 250 MG TABS Take 250 mg by mouth at bedtime.   metoprolol succinate (TOPROL-XL) 25 MG 24 hr tablet Take 1 tablet (25 mg total) by mouth daily.   metoprolol tartrate (LOPRESSOR) 100 MG tablet Take 1 tablet (100 mg total) by mouth once for 1 dose. Take 2 hours prior to cardiac CT.   nortriptyline (PAMELOR) 10 MG capsule Take 1 capsule (10 mg total) by mouth at bedtime.   terbinafine (LAMISIL) 250 MG tablet Take 1 tablet (250 mg total) by mouth daily.   triamcinolone (NASACORT) 55 MCG/ACT nasal inhaler Place 1 spray into the nose daily.    valsartan (DIOVAN) 160 MG tablet Take 1 tablet (160 mg total) by mouth daily.   Zoster Vaccine Adjuvanted [facility]) injection Inject 0.5 mLs into the muscle.     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    Employer: [employer]  Tobacco Use   Smoking status: Never   Smokeless tobacco: Never  Vaping Use   Vaping status: Never Used  Substance and Sexual Activity   Alcohol use: No    Alcohol/week: 0.0 standard drinks of alcohol   Drug use: No   Sexual activity: Yes    Birth control/protection: Post-menopausal  Other Topics Concern   Not on file  Social History Narrative   Pt is married   She has a Energy manager degree    She has 2 daughters    Social Drivers of Corporate investment banker Strain: Low Risk  (11/28/2023)   Overall Financial Resource Strain (CARDIA)    Difficulty of Paying Living Expenses: Not very hard  Food Insecurity: No Food Insecurity (11/28/2023)   Hunger Vital Sign    Worried About Running Out of Food in the Last Year: Never true    Ran Out of Food in the Last Year: Never true  Transportation Needs: No Transportation Needs (11/28/2023)   PRAPARE - Administrator, Civil Service (Medical): No    Lack of Transportation (Non-Medical): No  Physical Activity: Unknown (11/28/2023)   Exercise Vital Sign    Days of Exercise per Week: 0 days    Minutes of Exercise per Session: Not on file  Recent Concern: Physical Activity - Inactive (11/28/2023)   Exercise Vital Sign    Days of Exercise per Week: 0 days    Minutes of Exercise per Session: 20 min  Stress: No Stress Concern Present (11/28/2023)   [employer] of Occupational Health - Occupational Stress Questionnaire    Feeling of Stress : Not at all  Social Connections: Socially Integrated (11/28/2023)   Social Connection and Isolation Panel [NHANES]    Frequency of Communication  with Friends and Family: More than three times a week    Frequency of Social Gatherings with Friends and Family: Once a week    Attends Religious Services: More than 4 times per year    Active Member of [employer] or Organizations: Yes    Attends Engineer, structural: More than 4 times per year    Marital Status: Married     Family History: The patient's family history includes  Colon cancer in her maternal grandfather and maternal uncle; Coronary artery disease in her father; Diabetes in her brother, father, and mother; Heart attack in her father; Hemachromatosis in her brother and father; Hypertension in her mother; Hypothyroidism in her mother. There is no history of Colon polyps, Crohn's disease, Esophageal cancer, Rectal cancer, Stomach cancer, or Ulcerative colitis.  ROS:   Please see the history of present illness.     All other systems reviewed and are negative.  EKGs/Labs/Other Studies Reviewed:    The following studies were reviewed today:  EKG Interpretation Date/Time:  Thursday February 06 2024 10:18:32 EDT Ventricular Rate:  81 PR Interval:  146 QRS Duration:  82 QT Interval:  386 QTC Calculation: 448 R Axis:   0  Text Interpretation: Normal sinus rhythm Normal ECG Confirmed by Debbe Odea ([id number]) on 02/06/2024 10:40:17 AM    Recent Labs: 11/27/2023: ALT 25; Hemoglobin 13.5; Platelets 202 11/29/2023: TSH 2.40 12/13/2023: BUN 16; Creatinine, Ser 0.81; Potassium 4.3; Sodium 144  Recent Lipid Panel    Component Value Date/Time   CHOL 198 09/09/2023 0938   TRIG 154.0 (H) 09/09/2023 0938   HDL 32.20 (L) 09/09/2023 0938   CHOLHDL 6 09/09/2023 0938   VLDL 30.8 09/09/2023 0938   LDLCALC 135 (H) 09/09/2023 0938   LDLDIRECT 160.0 04/09/2022 1436     Risk Assessment/Calculations:             Physical Exam:    VS:  BP 110/68 (BP Location: Left Arm, Patient Position: Sitting, Cuff Size: Normal)   Pulse 81   Ht 5\' 3"  (1.6 m)   Wt 177 lb 9.6 oz (80.6 kg)    SpO2 95%   BMI 31.46 kg/m     Wt Readings from Last 3 Encounters:  02/06/24 177 lb 9.6 oz (80.6 kg)  12/13/23 175 lb 6.4 oz (79.6 kg)  11/29/23 170 lb 8 oz (77.3 kg)     GEN:  Well nourished, well developed in no acute distress HEENT: Normal NECK: No JVD; No carotid bruits CARDIAC: RRR, no murmurs, rubs, gallops RESPIRATORY:  Clear to auscultation without rales, wheezing or rhonchi  ABDOMEN: Soft, non-tender, non-distended MUSCULOSKELETAL:  No edema; No deformity  SKIN: Warm and dry NEUROLOGIC:  Alert and oriented x 3 PSYCHIATRIC:  Normal affect   ASSESSMENT:    1. Precordial pain   2. Primary hypertension   3. Exertional dyspnea   4. Mixed hyperlipidemia    PLAN:    In order of problems listed above:  Precordial pain, family history of CAD.  Obtain coronary CTA.  Echo showed normal systolic function. Hypertension, BP controlled.  Continue Toprol-XL 25 mg daily, valsartan 160 mg daily. Dyspnea on exertion, this could be an anginal equivalent.  Deconditioning/being overweight could also be contributing.  Ischemic workup with coronary CT as above. Hyperlipidemia, 10-year ASCVD risk 3.8%.  Not in statin benefit group.  Increase activity, low-cholesterol diet.  Follow-up after coronary CTA.  Okay to follow-up as needed if coronary CTA is otherwise normal.     Medication Adjustments/Labs and Tests Ordered: Current medicines are reviewed at length with the patient today.  Concerns regarding medicines are outlined above.  Orders Placed This Encounter  Procedures   CT CORONARY MORPH W/CTA COR W/SCORE W/CA W/CM &/OR WO/CM   Basic metabolic panel with GFR   EKG 60-AVWU   Meds ordered this encounter  Medications   metoprolol tartrate (LOPRESSOR) 100 MG tablet    Sig: Take 1 tablet (100 mg total) by mouth  once for 1 dose. Take 2 hours prior to cardiac CT.    Dispense:  1 tablet    Refill:  0    Patient Instructions  Medication Instructions:   Your Physician recommend  you continue on your current medication as directed.     Take one Metoprolol 100 mg 2 hours prior to Cardiac CT.   *If you need a refill on your cardiac medications before your next appointment, please call your pharmacy*  Lab Work: Your provider would like for you to have following labs drawn today BMP.    If you have labs (blood work) drawn today and your tests are completely normal, you will receive your results only by: MyChart Message (if you have MyChart) OR A paper copy in the mail If you have any lab test that is abnormal or we need to change your treatment, we will call you to review the results.  Testing/Procedures:   Your cardiac CT will be scheduled at one of the below locations:    [facility] [address] [place], [place] [address] [phone]  OR   [facility] [address] [place], [place] [address] [phone]   If scheduled at [facility] or [facility], please arrive 15 mins early for check-in and test prep.  There is spacious parking and easy access to the radiology department from the [facility] Heart and Vascular entrance. Please enter here and check-in with the desk attendant.   Please follow these instructions carefully (unless otherwise directed):  An IV will be required for this test and Nitroglycerin will be given.   On the Night Before the Test: Be sure to Drink plenty of water. Do not consume any caffeinated/decaffeinated beverages or chocolate 12 hours prior to your test. Do not take any antihistamines 12 hours prior to your test.  On the Day of the Test: Drink plenty of water until 1 hour prior to the test. Do not eat any food 1 hour prior to test. You may take your regular medications prior to the test.  Take metoprolol (Lopressor) two hours prior to test. If you take  Furosemide/Hydrochlorothiazide/Spironolactone/Chlorthalidone, please HOLD on the morning of the test. Patients who wear a continuous glucose monitor MUST remove the device prior to scanning. FEMALES- please wear underwire-free bra if available, avoid dresses & tight clothing      After the Test: Drink plenty of water. After receiving IV contrast, you may experience a mild flushed feeling. This is normal. On occasion, you may experience a mild rash up to 24 hours after the test. This is not dangerous. If this occurs, you can take Benadryl 25 mg, Zyrtec, Claritin, or Allegra and increase your fluid intake. (Patients taking Tikosyn should avoid Benadryl, and may take Zyrtec, Claritin, or Allegra) If you experience trouble breathing, this can be serious. If it is severe call 911 IMMEDIATELY. If it is mild, please call our office.  We will call to schedule your test 2-4 weeks out understanding that some insurance companies will need an authorization prior to the service being performed.   For more information and frequently asked questions, please visit our website : [email or web address]  For non-scheduling related questions, please contact the cardiac imaging nurse navigator should you have any questions/concerns: Cardiac Imaging Nurse Navigators Direct Office Dial: [phone]   For scheduling needs, including cancellations and rescheduling, please call [place], [phone].   Follow-Up: At [facility], you and your health  needs are our priority.  As part of our continuing mission to provide you with exceptional heart care, our providers are all part of one team.  This team includes your primary Cardiologist (physician) and Advanced Practice Providers or APPs (Physician Assistants and Nurse Practitioners) who all work together to provide you with the care you need, when you need it.  Your next appointment:   2 month(s)  Provider:   You may see Dr. Azucena Cecil or one  of the following Advanced Practice Providers on your designated Care Team:   Nicolasa Ducking, NP Ames Dura, PA-C Eula Listen, PA-C [facility], PA-C Charlsie Quest, NP Carlos Levering, NP    We recommend signing up for the patient portal called "MyChart".  Sign up information is provided on this After Visit Summary.  MyChart is used to connect with patients for Virtual Visits (Telemedicine).  Patients are able to view lab/test results, encounter notes, upcoming appointments, etc.  Non-urgent messages can be sent to your provider as well.   To learn more about what you can do with MyChart, go to [email or web address].         Signed, Debbe Odea, MD  02/06/2024 11:13 AM    [facility]

## 2024-02-06 NOTE — Patient Instructions (Signed)
 Medication Instructions:   Your Physician recommend you continue on your current medication as directed.     Take one Metoprolol 100 mg 2 hours prior to Cardiac CT.   *If you need a refill on your cardiac medications before your next appointment, please call your pharmacy*  Lab Work: Your provider would like for you to have following labs drawn today BMP.    If you have labs (blood work) drawn today and your tests are completely normal, you will receive your results only by: MyChart Message (if you have MyChart) OR A paper copy in the mail If you have any lab test that is abnormal or we need to change your treatment, we will call you to review the results.  Testing/Procedures:   Your cardiac CT will be scheduled at one of the below locations:    [facility] [address] [place], [place] [address] [phone]  OR   [facility] [address] [place], [place] [address] [phone]   If scheduled at [facility] or [facility], please arrive 15 mins early for check-in and test prep.  There is spacious parking and easy access to the radiology department from the [facility] Heart and Vascular entrance. Please enter here and check-in with the desk attendant.   Please follow these instructions carefully (unless otherwise directed):  An IV will be required for this test and Nitroglycerin will be given.   On the Night Before the Test: Be sure to Drink plenty of water. Do not consume any caffeinated/decaffeinated beverages or chocolate 12 hours prior to your test. Do not take any antihistamines 12 hours prior to your test.  On the Day of the Test: Drink plenty of water until 1 hour prior to the test. Do not eat any food 1 hour prior to test. You may take your regular medications prior to the test.  Take metoprolol (Lopressor) two hours prior to test. If you take  Furosemide/Hydrochlorothiazide/Spironolactone/Chlorthalidone, please HOLD on the morning of the test. Patients who wear a continuous glucose monitor MUST remove the device prior to scanning. FEMALES- please wear underwire-free bra if available, avoid dresses & tight clothing      After the Test: Drink plenty of water. After receiving IV contrast, you may experience a mild flushed feeling. This is normal. On occasion, you may experience a mild rash up to 24 hours after the test. This is not dangerous. If this occurs, you can take Benadryl 25 mg, Zyrtec, Claritin, or Allegra and increase your fluid intake. (Patients taking Tikosyn should avoid Benadryl, and may take Zyrtec, Claritin, or Allegra) If you experience trouble breathing, this can be serious. If it is severe call 911 IMMEDIATELY. If it is mild, please call our office.  We will call to schedule your test 2-4 weeks out understanding that some insurance companies will need an authorization prior to the service being performed.   For more information and frequently asked questions, please visit our website : [email or web address]  For non-scheduling related questions, please contact the cardiac imaging nurse navigator should you have any questions/concerns: Cardiac Imaging Nurse Navigators Direct Office Dial: [phone]   For scheduling needs, including cancellations and rescheduling, please call [place], [phone].   Follow-Up: At [facility], you and your health needs are our priority.  As part of our continuing mission to provide you with exceptional heart care, our providers are all part of one team.  This team includes your  primary Cardiologist (physician) and Advanced Practice Providers or APPs (Physician Assistants and Nurse Practitioners) who all work together to provide you with the care you need, when you need it.  Your next appointment:   2 month(s)  Provider:   You may see Dr. Azucena Cecil or one  of the following Advanced Practice Providers on your designated Care Team:   Nicolasa Ducking, NP Ames Dura, PA-C Eula Listen, PA-C [facility], PA-C Charlsie Quest, NP Carlos Levering, NP    We recommend signing up for the patient portal called "MyChart".  Sign up information is provided on this After Visit Summary.  MyChart is used to connect with patients for Virtual Visits (Telemedicine).  Patients are able to view lab/test results, encounter notes, upcoming appointments, etc.  Non-urgent messages can be sent to your provider as well.   To learn more about what you can do with MyChart, go to [email or web address].

## 2024-02-07 LAB — BASIC METABOLIC PANEL WITH GFR
BUN/Creatinine Ratio: 15 (ref 9–23)
BUN: 14 mg/dL (ref 6–24)
CO2: 21 mmol/L (ref 20–29)
Calcium: 9.7 mg/dL (ref 8.7–10.2)
Chloride: 102 mmol/L (ref 96–106)
Creatinine, Ser: 0.96 mg/dL (ref 0.57–1.00)
Glucose: 148 mg/dL — ABNORMAL HIGH (ref 70–99)
Potassium: 4 mmol/L (ref 3.5–5.2)
Sodium: 141 mmol/L (ref 134–144)
eGFR: 69 mL/min/{1.73_m2} (ref >59)

## 2024-02-11 ENCOUNTER — Other Ambulatory Visit: Payer: Self-pay

## 2024-02-14 ENCOUNTER — Telehealth (HOSPITAL_COMMUNITY): Payer: Self-pay | Admitting: *Deleted

## 2024-02-14 ENCOUNTER — Other Ambulatory Visit: Payer: Self-pay

## 2024-02-14 NOTE — Telephone Encounter (Signed)
 Attempted to call patient regarding upcoming cardiac CT appointment. Left message on voicemail with name and callback number Johney Frame RN Navigator Cardiac Imaging [facility] [phone] Office

## 2024-02-14 NOTE — Telephone Encounter (Signed)
 Reaching out to patient to offer assistance regarding upcoming cardiac imaging study; pt verbalizes understanding of appt date/time, parking situation and where to check in, pre-test NPO status and medications ordered, and verified current allergies; name and call back number provided for further questions should they arise Johney Frame RN Navigator Cardiac Imaging [facility] [phone] office [phone] cell

## 2024-02-17 ENCOUNTER — Ambulatory Visit
Admission: RE | Admit: 2024-02-17 | Discharge: 2024-02-17 | Disposition: A | Discharge status: Home / Self Care | Source: Ambulatory Visit | Attending: Cardiology | Admitting: Cardiology

## 2024-02-17 DIAGNOSIS — R072 Precordial pain: Secondary | ICD-10-CM | POA: Insufficient documentation

## 2024-02-17 MED ORDER — NITROGLYCERIN 0.4 MG SL SUBL
SUBLINGUAL_TABLET | SUBLINGUAL | Status: AC
  Filled 2024-02-17: qty 2

## 2024-02-17 MED ORDER — METOPROLOL TARTRATE 5 MG/5ML IV SOLN
INTRAVENOUS | Status: AC
  Filled 2024-02-17: qty 10

## 2024-02-17 MED ORDER — DILTIAZEM HCL 25 MG/5ML IV SOLN
10.0000 mg | INTRAVENOUS | Status: DC | PRN
  Filled 2024-02-17: qty 5

## 2024-02-17 MED ORDER — NITROGLYCERIN 0.4 MG SL SUBL
0.8000 mg | SUBLINGUAL_TABLET | Freq: Once | SUBLINGUAL | Status: AC
  Administered 2024-02-17: 0.8 mg via SUBLINGUAL
  Filled 2024-02-17: qty 25

## 2024-02-17 MED ORDER — IOHEXOL 350 MG/ML SOLN
80.0000 mL | Freq: Once | INTRAVENOUS | Status: AC | PRN
  Administered 2024-02-17: 80 mL via INTRAVENOUS

## 2024-02-17 MED ORDER — METOPROLOL TARTRATE 5 MG/5ML IV SOLN
10.0000 mg | Freq: Once | INTRAVENOUS | Status: AC | PRN
  Administered 2024-02-17: 10 mg via INTRAVENOUS
  Filled 2024-02-17: qty 10

## 2024-02-20 ENCOUNTER — Other Ambulatory Visit: Payer: Self-pay

## 2024-02-20 ENCOUNTER — Other Ambulatory Visit: Payer: Self-pay | Admitting: Podiatry

## 2024-02-21 ENCOUNTER — Other Ambulatory Visit: Payer: Self-pay

## 2024-02-21 MED ORDER — ATORVASTATIN CALCIUM 20 MG PO TABS
20.0000 mg | ORAL_TABLET | Freq: Every day | ORAL | 3 refills | Status: AC
  Filled 2024-02-21: qty 90, 90d supply, fill #0
  Filled 2024-05-20: qty 90, 90d supply, fill #1
  Filled 2024-08-23: qty 90, 90d supply, fill #2
  Filled 2024-11-18: qty 90, 90d supply, fill #3
  Filled 2024-11-19: qty 90, 90d supply, fill #0

## 2024-02-24 ENCOUNTER — Other Ambulatory Visit: Payer: Self-pay

## 2024-02-25 ENCOUNTER — Other Ambulatory Visit: Payer: Self-pay

## 2024-03-02 ENCOUNTER — Other Ambulatory Visit: Payer: Self-pay

## 2024-03-03 ENCOUNTER — Other Ambulatory Visit: Payer: Self-pay

## 2024-03-04 ENCOUNTER — Other Ambulatory Visit: Payer: Self-pay

## 2024-03-04 DIAGNOSIS — I10 Essential (primary) hypertension: Secondary | ICD-10-CM

## 2024-03-04 MED ORDER — VALSARTAN 160 MG PO TABS
160.0000 mg | ORAL_TABLET | Freq: Every day | ORAL | 3 refills | Status: AC
  Filled 2024-03-04: qty 90, 90d supply, fill #0
  Filled 2024-06-06: qty 90, 90d supply, fill #1
  Filled 2024-09-03: qty 90, 90d supply, fill #2
  Filled 2024-12-09 – 2024-12-11 (×2): qty 90, 90d supply, fill #3

## 2024-03-11 ENCOUNTER — Ambulatory Visit: Payer: Commercial Managed Care - PPO | Admitting: Nurse Practitioner

## 2024-03-11 ENCOUNTER — Other Ambulatory Visit: Payer: Self-pay

## 2024-03-11 ENCOUNTER — Encounter: Payer: Self-pay | Admitting: Nurse Practitioner

## 2024-03-11 VITALS — BP 108/70 | HR 73 | Temp 98.2°F | Ht 63.0 in | Wt 179.4 lb

## 2024-03-11 DIAGNOSIS — I10 Essential (primary) hypertension: Secondary | ICD-10-CM

## 2024-03-11 DIAGNOSIS — R7303 Prediabetes: Secondary | ICD-10-CM | POA: Diagnosis not present

## 2024-03-11 DIAGNOSIS — F419 Anxiety disorder, unspecified: Secondary | ICD-10-CM

## 2024-03-11 DIAGNOSIS — M810 Age-related osteoporosis without current pathological fracture: Secondary | ICD-10-CM | POA: Diagnosis not present

## 2024-03-11 DIAGNOSIS — E039 Hypothyroidism, unspecified: Secondary | ICD-10-CM | POA: Diagnosis not present

## 2024-03-11 DIAGNOSIS — E7800 Pure hypercholesterolemia, unspecified: Secondary | ICD-10-CM | POA: Diagnosis not present

## 2024-03-11 DIAGNOSIS — F32A Depression, unspecified: Secondary | ICD-10-CM

## 2024-03-11 NOTE — Assessment & Plan Note (Signed)
 Her previous A1c was 6.3, indicating prediabetes. She was advised on diet and exercise for blood glucose management. She reports a poor diet and was advised to reduce carbohydrate intake and increase physical activity. Order an A1c test to assess current blood glucose control, advise a high protein, low carbohydrate diet, and encourage regular physical activity.

## 2024-03-11 NOTE — Progress Notes (Signed)
 Bluford Burkitt, NP-C Phone: [phone]  Lindsey Carson is a 58 y.o. female who presents today for transfer of care.   Discussed the use of AI scribe software for clinical note transcription with the patient, who gave verbal consent to proceed.  History of Present Illness   Lindsey Carson is a 58 year old female who presents for transfer of care.  She has a history of hypothyroidism following the removal of a parathyroid  tumor and is currently managed with levothyroxine , which she takes six days a week, omitting Fridays. No issues with skin, hair, or nails, and no temperature regulation problems. Occasional heart palpitations occur, and she takes her medication every morning on an empty stomach.  Recently started on Lipitor after a cardiology evaluation revealed the beginning of blockages following an emergency room visit for chest pressure. No further chest pain, shortness of breath, dizziness, or swelling reported.  Her blood pressure is managed with valsartan  160 mg and metoprolol  25 mg, which was added after experiencing tachycardia related to bone density injections. She checks her blood pressure occasionally at work with no current issues.  She has a history of anxiety and depression, currently exacerbated by job-related stress due to potential job cuts. She is not on any specific medication for mood but takes nortriptyline  at bedtime for headaches.  She has a history of elevated blood sugar levels, with a past A1c of 6.3, indicating a risk for type 2 diabetes. She acknowledges a poor diet, particularly with sweets, and admits to needing improvement in diet and exercise. She is active with her children and involved in moving activities, but not engaged in regular exercise.      Social History   Tobacco Use  Smoking Status Never  Smokeless Tobacco Never    Current Outpatient Medications on File Prior to Visit  Medication Sig Dispense Refill   aspirin  EC 81 MG tablet Take 1 tablet (81  mg total) by mouth daily. (Patient taking differently: Take 81 mg by mouth at bedtime.) 90 tablet 3   atorvastatin  (LIPITOR) 20 MG tablet Take 1 tablet (20 mg total) by mouth daily. 90 tablet 3   cetirizine (ZYRTEC) 10 MG tablet Take 10 mg by mouth at bedtime.      Cholecalciferol (VITAMIN D) 2000 units CAPS Take by mouth at bedtime.     denosumab  (PROLIA ) 60 MG/ML SOSY injection inject 60mg  Subcutaneous every 6 months (start October 2024) 180 days 1 mL 1   levothyroxine  (SYNTHROID ) 50 MCG tablet Take 1 tablet (50 mcg total) by mouth daily 6 days of the week 90 tablet 3   Magnesium 250 MG TABS Take 250 mg by mouth at bedtime.     metoprolol  succinate (TOPROL -XL) 25 MG 24 hr tablet Take 1 tablet (25 mg total) by mouth daily. 30 tablet 12   nortriptyline  (PAMELOR ) 10 MG capsule Take 1 capsule (10 mg total) by mouth at bedtime. 90 capsule 0   triamcinolone  (NASACORT ) 55 MCG/ACT nasal inhaler Place 1 spray into the nose daily.      valsartan  (DIOVAN ) 160 MG tablet Take 1 tablet (160 mg total) by mouth daily. 90 tablet 3   Zoster Vaccine Adjuvanted (SHINGRIX ) injection Inject 0.5 mLs into the muscle. 0.5 mL 1   No current facility-administered medications on file prior to visit.    ROS see history of present illness  Objective  Physical Exam Vitals:   03/11/24 1343  BP: 108/70  Pulse: 73  Temp: 98.2 F (36.8 C)  SpO2: 97%  BP Readings from Last 3 Encounters:  03/11/24 108/70  02/17/24 109/66  02/06/24 110/68   Wt Readings from Last 3 Encounters:  03/11/24 179 lb 6.4 oz (81.4 kg)  02/06/24 177 lb 9.6 oz (80.6 kg)  12/13/23 175 lb 6.4 oz (79.6 kg)    Physical Exam Constitutional:      General: She is not in acute distress.    Appearance: Normal appearance.  HENT:     Head: Normocephalic.  Cardiovascular:     Rate and Rhythm: Normal rate and regular rhythm.     Heart sounds: Normal heart sounds.  Pulmonary:     Effort: Pulmonary effort is normal.     Breath sounds:  Normal breath sounds.  Skin:    General: Skin is warm and dry.  Neurological:     General: No focal deficit present.     Mental Status: She is alert.  Psychiatric:        Mood and Affect: Mood normal.        Behavior: Behavior normal.     Assessment/Plan: Please see individual problem list.  Primary hypertension Assessment & Plan: Her hypertension is well-controlled with the current medication regimen. Blood pressure today is 108/70 mmHg. She has no symptoms of chest pain, shortness of breath, or dizziness. Continue the current antihypertensive regimen with valsartan  160 mg daily and metoprolol  XL 25 mg daily.  Orders: -     CBC with Differential/Platelet -     Comprehensive metabolic panel with GFR  Hypothyroidism, unspecified type Assessment & Plan: Her hypothyroidism, secondary to previous parathyroid  tumor removal, is managed with levothyroxine  50 mcg daily. She experiences occasional heart palpitations but no other symptoms. Order a TSH test to monitor thyroid  function and continue levothyroxine  as prescribed.  Orders: -     TSH  Anxiety and depression Assessment & Plan: Her anxiety is related to work stress and job cuts. She is not currently on medication for mood management. Consider non-pharmacological interventions for stress management.    Pure hypercholesterolemia Assessment & Plan: Her hyperlipidemia is managed with atorvastatin  20 mg daily. No follow-up with cardiology is required as per the previous consultation. Continue atorvastatin  as prescribed. Check lipid panel today.   Orders: -     Lipid panel  Prediabetes Assessment & Plan: Her previous A1c was 6.3, indicating prediabetes. She was advised on diet and exercise for blood glucose management. She reports a poor diet and was advised to reduce carbohydrate intake and increase physical activity. Order an A1c test to assess current blood glucose control, advise a high protein, low carbohydrate diet, and  encourage regular physical activity.  Orders: -     Hemoglobin A1c  Age-related osteoporosis without current pathological fracture Assessment & Plan: Her osteoporosis is managed with Prolia  injections. No new concerns are reported. Continue Prolia  injections as scheduled.  Orders: -     VITAMIN D 25 Hydroxy (Vit-D Deficiency, Fractures)    Return in about 6 months (around 09/11/2024) for Follow up.   Bluford Burkitt, NP-C [facility]

## 2024-03-11 NOTE — Assessment & Plan Note (Signed)
 Her anxiety is related to work stress and job cuts. She is not currently on medication for mood management. Consider non-pharmacological interventions for stress management.

## 2024-03-11 NOTE — Assessment & Plan Note (Signed)
 Her hypothyroidism, secondary to previous parathyroid  tumor removal, is managed with levothyroxine  50 mcg daily. She experiences occasional heart palpitations but no other symptoms. Order a TSH test to monitor thyroid  function and continue levothyroxine  as prescribed.

## 2024-03-11 NOTE — Assessment & Plan Note (Signed)
 Her hypertension is well-controlled with the current medication regimen. Blood pressure today is 108/70 mmHg. She has no symptoms of chest pain, shortness of breath, or dizziness. Continue the current antihypertensive regimen with valsartan  160 mg daily and metoprolol  XL 25 mg daily.

## 2024-03-11 NOTE — Assessment & Plan Note (Signed)
 Her hyperlipidemia is managed with atorvastatin  20 mg daily. No follow-up with cardiology is required as per the previous consultation. Continue atorvastatin  as prescribed. Check lipid panel today.

## 2024-03-11 NOTE — Assessment & Plan Note (Signed)
 Her osteoporosis is managed with Prolia  injections. No new concerns are reported. Continue Prolia  injections as scheduled.

## 2024-03-12 ENCOUNTER — Other Ambulatory Visit: Payer: Self-pay | Admitting: Nurse Practitioner

## 2024-03-12 ENCOUNTER — Other Ambulatory Visit: Payer: Self-pay

## 2024-03-12 LAB — LIPID PANEL
Cholesterol: 162 mg/dL (ref 0–200)
HDL: 35 mg/dL — ABNORMAL LOW (ref >39.00)
LDL Cholesterol: 67 mg/dL (ref 0–99)
NonHDL: 127.46
Total CHOL/HDL Ratio: 5
Triglycerides: 301 mg/dL — ABNORMAL HIGH (ref 0.0–149.0)
VLDL: 60.2 mg/dL — ABNORMAL HIGH (ref 0.0–40.0)

## 2024-03-12 LAB — CBC WITH DIFFERENTIAL/PLATELET
Basophils Absolute: 0.1 10*3/uL (ref 0.0–0.1)
Basophils Relative: 1.5 % (ref 0.0–3.0)
Eosinophils Absolute: 0.1 10*3/uL (ref 0.0–0.7)
Eosinophils Relative: 1.6 % (ref 0.0–5.0)
HCT: 40.6 % (ref 36.0–46.0)
Hemoglobin: 13.7 g/dL (ref 12.0–15.0)
Lymphocytes Relative: 24.5 % (ref 12.0–46.0)
Lymphs Abs: 1.6 10*3/uL (ref 0.7–4.0)
MCHC: 33.8 g/dL (ref 30.0–36.0)
MCV: 87.2 fl (ref 78.0–100.0)
Monocytes Absolute: 0.6 10*3/uL (ref 0.1–1.0)
Monocytes Relative: 8.3 % (ref 3.0–12.0)
Neutro Abs: 4.2 10*3/uL (ref 1.4–7.7)
Neutrophils Relative %: 64.1 % (ref 43.0–77.0)
Platelets: 186 10*3/uL (ref 150.0–400.0)
RBC: 4.65 Mil/uL (ref 3.87–5.11)
RDW: 14.2 % (ref 11.5–15.5)
WBC: 6.6 10*3/uL (ref 4.0–10.5)

## 2024-03-12 LAB — COMPREHENSIVE METABOLIC PANEL WITH GFR
ALT: 19 U/L (ref 0–35)
AST: 18 U/L (ref 0–37)
Albumin: 4.2 g/dL (ref 3.5–5.2)
Alkaline Phosphatase: 90 U/L (ref 39–117)
BUN: 19 mg/dL (ref 6–23)
CO2: 26 meq/L (ref 19–32)
Calcium: 9.4 mg/dL (ref 8.4–10.5)
Chloride: 106 meq/L (ref 96–112)
Creatinine, Ser: 0.89 mg/dL (ref 0.40–1.20)
GFR: 71.64 mL/min (ref >60.00)
Glucose, Bld: 113 mg/dL — ABNORMAL HIGH (ref 70–99)
Potassium: 3.8 meq/L (ref 3.5–5.1)
Sodium: 141 meq/L (ref 135–145)
Total Bilirubin: 0.4 mg/dL (ref 0.2–1.2)
Total Protein: 6.7 g/dL (ref 6.0–8.3)

## 2024-03-12 LAB — HEMOGLOBIN A1C: Hgb A1c MFr Bld: 6.4 % (ref 4.6–6.5)

## 2024-03-12 LAB — VITAMIN D 25 HYDROXY (VIT D DEFICIENCY, FRACTURES): VITD: 22.48 ng/mL — ABNORMAL LOW (ref 30.00–100.00)

## 2024-03-12 LAB — TSH: TSH: 1.76 u[IU]/mL (ref 0.35–5.50)

## 2024-03-12 MED FILL — Nortriptyline HCl Cap 10 MG: ORAL | 90 days supply | Qty: 90 | Fill #0 | Status: AC

## 2024-03-16 ENCOUNTER — Encounter: Payer: Self-pay | Admitting: Nurse Practitioner

## 2024-03-19 ENCOUNTER — Ambulatory Visit: Payer: Self-pay | Admitting: Cardiology

## 2024-03-23 ENCOUNTER — Other Ambulatory Visit: Payer: Self-pay

## 2024-03-25 DIAGNOSIS — D220 Melanocytic nevi of lip: Secondary | ICD-10-CM | POA: Diagnosis not present

## 2024-03-25 DIAGNOSIS — L821 Other seborrheic keratosis: Secondary | ICD-10-CM | POA: Diagnosis not present

## 2024-03-25 DIAGNOSIS — L814 Other melanin hyperpigmentation: Secondary | ICD-10-CM | POA: Diagnosis not present

## 2024-03-25 DIAGNOSIS — D225 Melanocytic nevi of trunk: Secondary | ICD-10-CM | POA: Diagnosis not present

## 2024-03-25 DIAGNOSIS — L918 Other hypertrophic disorders of the skin: Secondary | ICD-10-CM | POA: Diagnosis not present

## 2024-03-25 DIAGNOSIS — D1801 Hemangioma of skin and subcutaneous tissue: Secondary | ICD-10-CM | POA: Diagnosis not present

## 2024-03-25 DIAGNOSIS — D2262 Melanocytic nevi of left upper limb, including shoulder: Secondary | ICD-10-CM | POA: Diagnosis not present

## 2024-03-26 ENCOUNTER — Ambulatory Visit: Payer: Commercial Managed Care - PPO | Admitting: Podiatry

## 2024-03-26 DIAGNOSIS — B351 Tinea unguium: Secondary | ICD-10-CM

## 2024-03-26 DIAGNOSIS — Z79899 Other long term (current) drug therapy: Secondary | ICD-10-CM | POA: Diagnosis not present

## 2024-03-26 NOTE — Progress Notes (Signed)
 Subjective:  Patient ID: Lindsey Carson, female    DOB: [birth year],  MRN: [id number]  Chief Complaint  Patient presents with   Nail Problem    Nail fungus follow up    58 y.o. female presents with the above complaint.  Patient presents for follow-up of left hallux onychomycosis.  She states is doing better.  She was able to tolerate the Lamisil .  She would like to do another round denies any other acute complaints   Review of Systems: Negative except as noted in the HPI. Denies N/V/F/Ch.  Past Medical History:  Diagnosis Date   Allergy    Anxiety    Arthritis    OA   Chronic allergic rhinitis    Severe, year long   Chronic back pain    HNP - history   Chronic low back pain 01/28/2020   Elevated blood pressure    History of   Facet arthropathy, lumbar 01/28/2020   Headache    Heart murmur    was told but doesn't require meds - never has been a problem   Hemorrhoids    History OF   Hypertension    OFF BP MEDS FOR 6 MONTHS   Joint pain    hands and knees, hips and shoulders   Osteoporosis    PONV (postoperative nausea and vomiting)    Previous back surgery 01/28/2020   Sciatica of right side 01/28/2020   Seasonal allergies    uses nasocort daily and zyrtec   Sinus infection    History of frequent   Spinal fracture YRS AGO   L5/S1   Thyroid  disease     Current Outpatient Medications:    aspirin  EC 81 MG tablet, Take 1 tablet (81 mg total) by mouth daily. (Patient taking differently: Take 81 mg by mouth at bedtime.), Disp: 90 tablet, Rfl: 3   atorvastatin  (LIPITOR) 20 MG tablet, Take 1 tablet (20 mg total) by mouth daily., Disp: 90 tablet, Rfl: 3   cetirizine (ZYRTEC) 10 MG tablet, Take 10 mg by mouth at bedtime. , Disp: , Rfl:    Cholecalciferol (VITAMIN D ) 2000 units CAPS, Take by mouth at bedtime., Disp: , Rfl:    denosumab  (PROLIA ) 60 MG/ML SOSY injection, inject 60mg  Subcutaneous every 6 months (start October 2024) 180 days, Disp: 1 mL, Rfl: 1   levothyroxine   (SYNTHROID ) 50 MCG tablet, Take 1 tablet (50 mcg total) by mouth daily 6 days of the week, Disp: 90 tablet, Rfl: 3   Magnesium 250 MG TABS, Take 250 mg by mouth at bedtime., Disp: , Rfl:    metoprolol  succinate (TOPROL -XL) 25 MG 24 hr tablet, Take 1 tablet (25 mg total) by mouth daily., Disp: 30 tablet, Rfl: 12   nortriptyline  (PAMELOR ) 10 MG capsule, Take 1 capsule (10 mg total) by mouth at bedtime., Disp: 90 capsule, Rfl: 3   triamcinolone  (NASACORT ) 55 MCG/ACT nasal inhaler, Place 1 spray into the nose daily. , Disp: , Rfl:    valsartan  (DIOVAN ) 160 MG tablet, Take 1 tablet (160 mg total) by mouth daily., Disp: 90 tablet, Rfl: 3   Zoster Vaccine Adjuvanted (SHINGRIX ) injection, Inject 0.5 mLs into the muscle., Disp: 0.5 mL, Rfl: 1  Social History   Tobacco Use  Smoking Status Never  Smokeless Tobacco Never    Allergies  Allergen Reactions   Prednisone      Other Reaction(s): confusion   Lisinopril Cough   Amoxicillin-Pot Clavulanate Diarrhea    Has patient had a PCN reaction causing immediate rash,  facial/tongue/throat swelling, SOB or lightheadedness with hypotension: No  Has patient had a PCN reaction causing severe rash involving mucus membranes or skin necrosis: No  Has patient had a PCN reaction that required hospitalization: No  Has patient had a PCN reaction occurring within the last 10 years: Yes  If all of the above answers are "NO", then may proceed with Cephalosporin use.  Other Reaction(s): GI intolerance   Latex Rash   Wound Dressing Adhesive Rash   Objective:  There were no vitals filed for this visit. There is no height or weight on file to calculate BMI. Constitutional Well developed. Well nourished.  Vascular Dorsalis pedis pulses palpable bilaterally. Posterior tibial pulses palpable bilaterally. Capillary refill normal to all digits.  No cyanosis or clubbing noted. Pedal hair growth normal.  Neurologic Normal speech. Oriented to person, place, and  time. Epicritic sensation to light touch grossly present bilaterally.  Dermatologic Nails left thickened onychodystrophy mycotic toenails x 1 mild pain on palpation improving Skin within normal limits  Orthopedic: Normal joint ROM without pain or crepitus bilaterally. No visible deformities. No bony tenderness.   Radiographs: None Assessment:   1. Encounter for long-term (current) use of high-risk medication    Plan:  Patient was evaluated and treated and all questions answered.  Left hallux onychomycosis~second round Educated the patient on the etiology of onychomycosis and various treatment options associated with improving the fungal load.  I explained to the patient that there is 3 treatment options available to treat the onychomycosis including topical, p.o., laser treatment.  Patient elected to undergo p.o. options with Lamisil /terbinafine  therapy.  In order for me to start the medication therapy, I explained to the patient the importance of evaluating the liver and obtaining the liver function test.  Once the liver function test comes back normal I will start him on 48-month course of Lamisil  therapy.  Patient understood all risk and would like to proceed with Lamisil  therapy.  I have asked the patient to immediately stop the Lamisil  therapy if she has any reactions to it and call the office or go to the emergency room right away.  Patient states understanding -  No follow-ups on file.

## 2024-03-27 LAB — HEPATIC FUNCTION PANEL
ALT: 20 IU/L (ref 0–32)
AST: 18 IU/L (ref 0–40)
Albumin: 4.3 g/dL (ref 3.8–4.9)
Alkaline Phosphatase: 125 IU/L — ABNORMAL HIGH (ref 44–121)
Bilirubin Total: 0.2 mg/dL (ref 0.0–1.2)
Bilirubin, Direct: 0.11 mg/dL (ref 0.00–0.40)
Total Protein: 6.3 g/dL (ref 6.0–8.5)

## 2024-04-01 ENCOUNTER — Other Ambulatory Visit: Payer: Self-pay

## 2024-04-01 ENCOUNTER — Encounter: Payer: Self-pay | Admitting: Podiatry

## 2024-04-01 MED ORDER — TERBINAFINE HCL 250 MG PO TABS
250.0000 mg | ORAL_TABLET | Freq: Every day | ORAL | 2 refills | Status: DC
  Filled 2024-04-01: qty 30, 30d supply, fill #0
  Filled 2024-05-04: qty 30, 30d supply, fill #1
  Filled 2024-06-06: qty 30, 30d supply, fill #2

## 2024-04-03 DIAGNOSIS — Z01419 Encounter for gynecological examination (general) (routine) without abnormal findings: Secondary | ICD-10-CM | POA: Diagnosis not present

## 2024-04-03 DIAGNOSIS — N952 Postmenopausal atrophic vaginitis: Secondary | ICD-10-CM | POA: Diagnosis not present

## 2024-04-03 DIAGNOSIS — Z1231 Encounter for screening mammogram for malignant neoplasm of breast: Secondary | ICD-10-CM | POA: Diagnosis not present

## 2024-04-03 DIAGNOSIS — Z6831 Body mass index (BMI) 31.0-31.9, adult: Secondary | ICD-10-CM | POA: Diagnosis not present

## 2024-04-07 ENCOUNTER — Ambulatory Visit: Admitting: Cardiology

## 2024-04-16 ENCOUNTER — Other Ambulatory Visit: Payer: Self-pay

## 2024-04-17 ENCOUNTER — Other Ambulatory Visit: Payer: Self-pay

## 2024-04-17 MED ORDER — LEVOTHYROXINE SODIUM 50 MCG PO TABS
50.0000 ug | ORAL_TABLET | ORAL | 3 refills | Status: AC
  Filled 2024-04-17: qty 72, 84d supply, fill #0
  Filled 2024-07-09: qty 72, 84d supply, fill #1
  Filled 2024-09-25: qty 72, 84d supply, fill #2

## 2024-05-05 ENCOUNTER — Other Ambulatory Visit: Payer: Self-pay

## 2024-05-20 ENCOUNTER — Other Ambulatory Visit: Payer: Self-pay

## 2024-05-21 ENCOUNTER — Other Ambulatory Visit: Payer: Self-pay

## 2024-05-21 MED ORDER — METOPROLOL SUCCINATE ER 25 MG PO TB24
25.0000 mg | ORAL_TABLET | Freq: Every day | ORAL | 12 refills | Status: AC
  Filled 2024-05-21: qty 90, 90d supply, fill #0
  Filled 2024-08-23: qty 90, 90d supply, fill #1
  Filled 2024-11-18: qty 90, 90d supply, fill #2
  Filled 2024-11-19: qty 90, 90d supply, fill #0

## 2024-06-07 ENCOUNTER — Other Ambulatory Visit: Payer: Self-pay

## 2024-06-18 MED FILL — Nortriptyline HCl Cap 10 MG: ORAL | 90 days supply | Qty: 90 | Fill #1 | Status: CN

## 2024-06-19 ENCOUNTER — Other Ambulatory Visit: Payer: Self-pay

## 2024-06-19 ENCOUNTER — Other Ambulatory Visit (HOSPITAL_COMMUNITY): Payer: Self-pay

## 2024-06-19 ENCOUNTER — Encounter: Payer: Self-pay | Admitting: Pharmacist

## 2024-06-19 NOTE — Progress Notes (Signed)
 Specialty Pharmacy Refill Coordination Note  Lindsey Carson is a 58 y.o. female contacted today regarding refills of specialty medication(s) Denosumab (Prolia)   Patient requested Courier to Provider Office   Delivery date: 06/22/24   Verified address: [facility] [address] [place] [address]   Medication will be filled on 06/19/24.

## 2024-06-22 ENCOUNTER — Other Ambulatory Visit (HOSPITAL_COMMUNITY): Payer: Self-pay

## 2024-06-24 ENCOUNTER — Other Ambulatory Visit: Payer: Self-pay

## 2024-06-26 ENCOUNTER — Other Ambulatory Visit: Payer: Self-pay

## 2024-06-26 ENCOUNTER — Other Ambulatory Visit: Payer: Self-pay | Admitting: Pharmacist

## 2024-06-26 ENCOUNTER — Other Ambulatory Visit (HOSPITAL_COMMUNITY): Payer: Self-pay

## 2024-06-26 DIAGNOSIS — I10 Essential (primary) hypertension: Secondary | ICD-10-CM | POA: Diagnosis not present

## 2024-06-26 DIAGNOSIS — R000 Tachycardia, unspecified: Secondary | ICD-10-CM | POA: Diagnosis not present

## 2024-06-26 DIAGNOSIS — M810 Age-related osteoporosis without current pathological fracture: Secondary | ICD-10-CM | POA: Diagnosis not present

## 2024-06-26 DIAGNOSIS — K760 Fatty (change of) liver, not elsewhere classified: Secondary | ICD-10-CM | POA: Diagnosis not present

## 2024-06-26 DIAGNOSIS — E039 Hypothyroidism, unspecified: Secondary | ICD-10-CM | POA: Diagnosis not present

## 2024-06-26 DIAGNOSIS — E213 Hyperparathyroidism, unspecified: Secondary | ICD-10-CM | POA: Diagnosis not present

## 2024-06-26 DIAGNOSIS — R931 Abnormal findings on diagnostic imaging of heart and coronary circulation: Secondary | ICD-10-CM | POA: Diagnosis not present

## 2024-06-26 DIAGNOSIS — E782 Mixed hyperlipidemia: Secondary | ICD-10-CM | POA: Diagnosis not present

## 2024-06-26 DIAGNOSIS — R7302 Impaired glucose tolerance (oral): Secondary | ICD-10-CM | POA: Diagnosis not present

## 2024-06-26 DIAGNOSIS — E559 Vitamin D deficiency, unspecified: Secondary | ICD-10-CM | POA: Diagnosis not present

## 2024-06-26 MED ORDER — PROLIA 60 MG/ML ~~LOC~~ SOSY
PREFILLED_SYRINGE | SUBCUTANEOUS | 1 refills | Status: AC
  Filled 2024-06-26: qty 1, fill #0
  Filled 2024-12-07 – 2024-12-10 (×2): qty 1, 30d supply, fill #0

## 2024-06-26 MED ORDER — PROLIA 60 MG/ML ~~LOC~~ SOSY: PREFILLED_SYRINGE | SUBCUTANEOUS | 1 refills | Status: DC

## 2024-06-26 MED FILL — Nortriptyline HCl Cap 10 MG: ORAL | 90 days supply | Qty: 90 | Fill #1 | Status: CN

## 2024-06-26 MED FILL — Nortriptyline HCl Cap 10 MG: ORAL | 90 days supply | Qty: 90 | Fill #0 | Status: AC

## 2024-07-09 ENCOUNTER — Other Ambulatory Visit: Payer: Self-pay

## 2024-07-09 DIAGNOSIS — E559 Vitamin D deficiency, unspecified: Secondary | ICD-10-CM | POA: Diagnosis not present

## 2024-07-09 DIAGNOSIS — M810 Age-related osteoporosis without current pathological fracture: Secondary | ICD-10-CM | POA: Diagnosis not present

## 2024-07-10 ENCOUNTER — Other Ambulatory Visit: Payer: Self-pay

## 2024-07-28 ENCOUNTER — Ambulatory Visit: Admitting: Podiatry

## 2024-07-28 DIAGNOSIS — B351 Tinea unguium: Secondary | ICD-10-CM | POA: Diagnosis not present

## 2024-07-28 DIAGNOSIS — Z79899 Other long term (current) drug therapy: Secondary | ICD-10-CM | POA: Diagnosis not present

## 2024-07-28 NOTE — Progress Notes (Signed)
 Subjective:  Patient ID: Lindsey Carson, female    DOB: March 02, 1966,  MRN: [id number]  Chief Complaint  Patient presents with   Nail Problem    Nail fungus follow up     58 y.o. female presents with the above complaint.  Patient presents for follow-up to left hallux onychomycosis she states that is doing better and better.  She has noticed much more improvement.  She has not had any issues with the medication.   Review of Systems: Negative except as noted in the HPI. Denies N/V/F/Ch.  Past Medical History:  Diagnosis Date   Allergy    Anxiety    Arthritis    OA   Chronic allergic rhinitis    Severe, year long   Chronic back pain    HNP - history   Chronic low back pain 01/28/2020   Elevated blood pressure    History of   Facet arthropathy, lumbar 01/28/2020   Headache    Heart murmur    was told but doesn't require meds - never has been a problem   Hemorrhoids    History OF   Hypertension    OFF BP MEDS FOR 6 MONTHS   Joint pain    hands and knees, hips and shoulders   Osteoporosis    PONV (postoperative nausea and vomiting)    Previous back surgery 01/28/2020   Sciatica of right side 01/28/2020   Seasonal allergies    uses nasocort daily and zyrtec   Sinus infection    History of frequent   Spinal fracture YRS AGO   L5/S1   Thyroid  disease     Current Outpatient Medications:    aspirin  EC 81 MG tablet, Take 1 tablet (81 mg total) by mouth daily. (Patient taking differently: Take 81 mg by mouth at bedtime.), Disp: 90 tablet, Rfl: 3   atorvastatin  (LIPITOR) 20 MG tablet, Take 1 tablet (20 mg total) by mouth daily., Disp: 90 tablet, Rfl: 3   cetirizine (ZYRTEC) 10 MG tablet, Take 10 mg by mouth at bedtime. , Disp: , Rfl:    Cholecalciferol (VITAMIN D ) 2000 units CAPS, Take by mouth at bedtime., Disp: , Rfl:    denosumab  (PROLIA ) 60 MG/ML SOSY injection, inject 60mg  Subcutaneous every 6 months (start October 2024), Disp: 1 mL, Rfl: 1   levothyroxine  (SYNTHROID ) 50  MCG tablet, Take 1 tablet (50 mcg total) by mouth 6 days a week., Disp: 90 tablet, Rfl: 3   Magnesium 250 MG TABS, Take 250 mg by mouth at bedtime., Disp: , Rfl:    metoprolol  succinate (TOPROL -XL) 25 MG 24 hr tablet, Take 1 tablet (25 mg total) by mouth daily., Disp: 30 tablet, Rfl: 12   nortriptyline  (PAMELOR ) 10 MG capsule, Take 1 capsule (10 mg total) by mouth at bedtime., Disp: 90 capsule, Rfl: 3   terbinafine  (LAMISIL ) 250 MG tablet, Take 1 tablet (250 mg total) by mouth daily., Disp: 30 tablet, Rfl: 2   triamcinolone  (NASACORT ) 55 MCG/ACT nasal inhaler, Place 1 spray into the nose daily. , Disp: , Rfl:    valsartan  (DIOVAN ) 160 MG tablet, Take 1 tablet (160 mg total) by mouth daily., Disp: 90 tablet, Rfl: 3   Zoster Vaccine Adjuvanted (SHINGRIX ) injection, Inject 0.5 mLs into the muscle., Disp: 0.5 mL, Rfl: 1  Social History   Tobacco Use  Smoking Status Never  Smokeless Tobacco Never    Allergies  Allergen Reactions   Prednisone      Other Reaction(s): confusion   Lisinopril Cough  Amoxicillin-Pot Clavulanate Diarrhea    Has patient had a PCN reaction causing immediate rash, facial/tongue/throat swelling, SOB or lightheadedness with hypotension: No  Has patient had a PCN reaction causing severe rash involving mucus membranes or skin necrosis: No  Has patient had a PCN reaction that required hospitalization: No  Has patient had a PCN reaction occurring within the last 10 years: Yes  If all of the above answers are NO, then may proceed with Cephalosporin use.  Other Reaction(s): GI intolerance   Latex Rash   Wound Dressing Adhesive Rash   Objective:  There were no vitals filed for this visit. There is no height or weight on file to calculate BMI. Constitutional Well developed. Well nourished.  Vascular Dorsalis pedis pulses palpable bilaterally. Posterior tibial pulses palpable bilaterally. Capillary refill normal to all digits.  No cyanosis or clubbing  noted. Pedal hair growth normal.  Neurologic Normal speech. Oriented to person, place, and time. Epicritic sensation to light touch grossly present bilaterally.  Dermatologic Nails left thickened onychodystrophy mycotic toenails x 1 mild pain on palpation improving Skin within normal limits  Orthopedic: Normal joint ROM without pain or crepitus bilaterally. No visible deformities. No bony tenderness.   Radiographs: None Assessment:   1. Encounter for long-term (current) use of high-risk medication   2. Nail fungus   3. Onychomycosis due to dermatophyte     Plan:  Patient was evaluated and treated and all questions answered.  Left hallux onychomycosis~third round Educated the patient on the etiology of onychomycosis and various treatment options associated with improving the fungal load.  I explained to the patient that there is 3 treatment options available to treat the onychomycosis including topical, p.o., laser treatment.  Patient elected to undergo p.o. options with Lamisil /terbinafine  therapy.  In order for me to start the medication therapy, I explained to the patient the importance of evaluating the liver and obtaining the liver function test.  Once the liver function test comes back normal I will start him on 70-month course of Lamisil  therapy.  Patient understood all risk and would like to proceed with Lamisil  therapy.  I have asked the patient to immediately stop the Lamisil  therapy if she has any reactions to it and call the office or go to the emergency room right away.  Patient states understanding -  No follow-ups on file.

## 2024-07-28 NOTE — Addendum Note (Signed)
 Addended by: LORREN MOATS on: 07/28/2024 09:50 AM   Modules accepted: Orders

## 2024-07-29 ENCOUNTER — Other Ambulatory Visit: Payer: Self-pay

## 2024-07-29 LAB — HEPATIC FUNCTION PANEL
ALT: 18 IU/L (ref 0–32)
AST: 18 IU/L (ref 0–40)
Albumin: 4.1 g/dL (ref 3.8–4.9)
Alkaline Phosphatase: 140 IU/L — ABNORMAL HIGH (ref 49–135)
Bilirubin Total: 0.3 mg/dL (ref 0.0–1.2)
Bilirubin, Direct: 0.09 mg/dL (ref 0.00–0.40)
Total Protein: 5.9 g/dL — ABNORMAL LOW (ref 6.0–8.5)

## 2024-07-29 MED ORDER — TERBINAFINE HCL 250 MG PO TABS
250.0000 mg | ORAL_TABLET | Freq: Every day | ORAL | 0 refills | Status: AC
  Filled 2024-07-29: qty 90, 90d supply, fill #0

## 2024-07-29 NOTE — Addendum Note (Signed)
 Addended by: Alexys Gassett on: 07/29/2024 09:01 AM   Modules accepted: Orders

## 2024-08-04 ENCOUNTER — Encounter: Payer: 59 | Admitting: Family Medicine

## 2024-08-12 ENCOUNTER — Telehealth: Payer: Self-pay | Admitting: Pharmacist

## 2024-08-12 NOTE — Telephone Encounter (Signed)
 Called patient to schedule an appointment for the [facility]. I was unable to reach the patient so I left a HIPAA-compliant message requesting that the patient return my call.   Herlene Fleeta Morris, PharmD, JAQUELINE, CPP Clinical Pharmacist [facility] [phone]

## 2024-08-23 ENCOUNTER — Other Ambulatory Visit: Payer: Self-pay

## 2024-09-04 ENCOUNTER — Other Ambulatory Visit: Payer: Self-pay

## 2024-09-11 ENCOUNTER — Ambulatory Visit: Payer: Self-pay | Admitting: Nurse Practitioner

## 2024-09-11 ENCOUNTER — Ambulatory Visit: Admitting: Nurse Practitioner

## 2024-09-11 ENCOUNTER — Encounter: Payer: Self-pay | Admitting: Nurse Practitioner

## 2024-09-11 VITALS — BP 100/68 | HR 72 | Temp 98.4°F | Ht 63.0 in | Wt 176.4 lb

## 2024-09-11 DIAGNOSIS — M810 Age-related osteoporosis without current pathological fracture: Secondary | ICD-10-CM | POA: Diagnosis not present

## 2024-09-11 DIAGNOSIS — R7303 Prediabetes: Secondary | ICD-10-CM

## 2024-09-11 DIAGNOSIS — I10 Essential (primary) hypertension: Secondary | ICD-10-CM | POA: Diagnosis not present

## 2024-09-11 DIAGNOSIS — R0609 Other forms of dyspnea: Secondary | ICD-10-CM | POA: Diagnosis not present

## 2024-09-11 DIAGNOSIS — E039 Hypothyroidism, unspecified: Secondary | ICD-10-CM | POA: Diagnosis not present

## 2024-09-11 LAB — VITAMIN D 25 HYDROXY (VIT D DEFICIENCY, FRACTURES): VITD: 41.81 ng/mL (ref 30.00–100.00)

## 2024-09-11 LAB — HEMOGLOBIN A1C: Hgb A1c MFr Bld: 6.5 % (ref 4.6–6.5)

## 2024-09-11 LAB — TSH: TSH: 2.06 u[IU]/mL (ref 0.35–5.50)

## 2024-09-11 NOTE — Progress Notes (Signed)
 Leron Glance, NP-C Phone: [phone]  Lindsey Carson is a 58 y.o. female who presents today for follow up.   Discussed the use of AI scribe software for clinical note transcription with the patient, who gave verbal consent to proceed.  History of Present Illness   Lindsey Carson is a 58 year old female who presents for follow-up and lab rechecks.  She is here to recheck her vitamin D  levels and A1c. She has a history of low vitamin D  and is currently receiving Prolia  injections. She has increased her vitamin D  intake. Her last A1c was 6.4. She acknowledges poor diet and exercise habits.  She experiences hip pain during walking, describing it as a 'grabbing' sensation in both hips, occurring halfway around her block.  She has a history of chest pain and underwent a cardiac workup earlier this year, including a stress test and scans, all of which were normal. She experiences shortness of breath and a 'wheezy chest' with exertion, such as climbing stairs or fast walking, which improve with rest and deep breathing. No dizziness or swelling.  She continues to take metoprolol  and valsartan  for blood pressure management. She also takes levothyroxine  for thyroid  management. She has noticed ridges in her nails but denies any changes in hair, skin, or experiencing heart palpitations.  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 She is active during work but less so on her days off, engaging in household activities during weekends without a formal exercise routine.      Social History   Tobacco Use  Smoking Status Never  Smokeless Tobacco Never    Current Outpatient Medications on File Prior to Visit  Medication Sig Dispense Refill   aspirin  EC 81 MG tablet Take 1 tablet (81 mg total) by mouth daily. (Patient taking differently: Take 81 mg by mouth at bedtime.) 90 tablet 3   atorvastatin  (LIPITOR) 20 MG tablet Take 1 tablet (20 mg total) by mouth daily. 90 tablet 3    cetirizine (ZYRTEC) 10 MG tablet Take 10 mg by mouth at bedtime.      Cholecalciferol (VITAMIN D ) 2000 units CAPS Take by mouth at bedtime.     denosumab  (PROLIA ) 60 MG/ML SOSY injection inject 60mg  Subcutaneous every 6 months (start October 2024) 1 mL 1   levothyroxine  (SYNTHROID ) 50 MCG tablet Take 1 tablet (50 mcg total) by mouth 6 days a week. 90 tablet 3   Magnesium 250 MG TABS Take 250 mg by mouth at bedtime.     metoprolol  succinate (TOPROL -XL) 25 MG 24 hr tablet Take 1 tablet (25 mg total) by mouth daily. 30 tablet 12   nortriptyline  (PAMELOR ) 10 MG capsule Take 1 capsule (10 mg total) by mouth at bedtime. 90 capsule 3   terbinafine  (LAMISIL ) 250 MG tablet Take 1 tablet (250 mg total) by mouth daily. 90 tablet 0   triamcinolone  (NASACORT ) 55 MCG/ACT nasal inhaler Place 1 spray into the nose daily.      valsartan  (DIOVAN ) 160 MG tablet Take 1 tablet (160 mg total) by mouth daily. 90 tablet 3   Zoster Vaccine Adjuvanted (SHINGRIX ) injection Inject 0.5 mLs into the muscle. 0.5 mL 1   No current facility-administered medications on file prior to visit.     ROS see history of present illness  Objective  Physical Exam Vitals:   09/11/24 0955  BP: 100/68  Pulse: 72  Temp: 98.4 F (36.9 C)  SpO2: 96%  BP Readings from Last 3 Encounters:  09/11/24 100/68  03/11/24 108/70  02/17/24 109/66   Wt Readings from Last 3 Encounters:  09/11/24 176 lb 6.4 oz (80 kg)  03/11/24 179 lb 6.4 oz (81.4 kg)  02/06/24 177 lb 9.6 oz (80.6 kg)    Physical Exam Constitutional:      General: She is not in acute distress.    Appearance: Normal appearance.  HENT:     Head: Normocephalic.  Cardiovascular:     Rate and Rhythm: Normal rate and regular rhythm.     Heart sounds: Normal heart sounds.  Pulmonary:     Effort: Pulmonary effort is normal.     Breath sounds: Normal breath sounds.  Skin:    General: Skin is warm and dry.  Neurological:     General: No focal deficit present.      Mental Status: She is alert.  Psychiatric:        Mood and Affect: Mood normal.        Behavior: Behavior normal.      Assessment/Plan: Please see individual problem list.  Exertional dyspnea Assessment & Plan: Chronic issue. Symptoms likely due to deconditioning with a normal cardiac workup. Encouraged regular exercise to improve conditioning. Instructed to monitor symptoms and will consider pulmonary function testing if symptoms worsen.   Prediabetes Assessment & Plan: A1c increased to 6.4, remaining in the prediabetic range. Recheck A1c today. Emphasized lifestyle modifications to prevent diabetes progression, including dietary changes to reduce carbohydrate intake and regular exercise.  Orders: -     Hemoglobin A1c  Primary hypertension Assessment & Plan: Her hypertension is well-controlled with the current medication regimen. Blood pressure today is 100/68 mmHg. She has no symptoms of chest pain, shortness of breath, or dizziness. Continue the current antihypertensive regimen with valsartan  160 mg daily and metoprolol  XL 25 mg daily.   Hypothyroidism, unspecified type Assessment & Plan: Her hypothyroidism, secondary to previous parathyroid  tumor removal, is managed with levothyroxine  50 mcg daily. No significant symptom changes are noted. Order a TSH test to monitor thyroid  function and continue levothyroxine  as prescribed.  Orders: -     TSH  Age-related osteoporosis without current pathological fracture Assessment & Plan: Managed with Prolia  injections. Previously noted low vitamin D  levels, recheck vitamin D  level today. Continue Prolia  injections.  Orders: -     VITAMIN D  25 Hydroxy (Vit-D Deficiency, Fractures)     Return in about 6 months (around 03/11/2025) for Follow up.   Leron Glance, NP-C [facility]

## 2024-09-16 ENCOUNTER — Encounter: Payer: Self-pay | Admitting: Nurse Practitioner

## 2024-09-16 NOTE — Assessment & Plan Note (Signed)
 Her hypertension is well-controlled with the current medication regimen. Blood pressure today is 100/68 mmHg. She has no symptoms of chest pain, shortness of breath, or dizziness. Continue the current antihypertensive regimen with valsartan  160 mg daily and metoprolol  XL 25 mg daily.

## 2024-09-16 NOTE — Assessment & Plan Note (Signed)
 Her hypothyroidism, secondary to previous parathyroid  tumor removal, is managed with levothyroxine  50 mcg daily. No significant symptom changes are noted. Order a TSH test to monitor thyroid  function and continue levothyroxine  as prescribed.

## 2024-09-16 NOTE — Assessment & Plan Note (Signed)
 Managed with Prolia  injections. Previously noted low vitamin D  levels, recheck vitamin D  level today. Continue Prolia  injections.

## 2024-09-16 NOTE — Assessment & Plan Note (Signed)
 Chronic issue. Symptoms likely due to deconditioning with a normal cardiac workup. Encouraged regular exercise to improve conditioning. Instructed to monitor symptoms and will consider pulmonary function testing if symptoms worsen.

## 2024-09-16 NOTE — Assessment & Plan Note (Signed)
 A1c increased to 6.4, remaining in the prediabetic range. Recheck A1c today. Emphasized lifestyle modifications to prevent diabetes progression, including dietary changes to reduce carbohydrate intake and regular exercise.

## 2024-09-25 ENCOUNTER — Other Ambulatory Visit (HOSPITAL_COMMUNITY): Payer: Self-pay

## 2024-09-25 ENCOUNTER — Other Ambulatory Visit: Payer: Self-pay

## 2024-09-25 MED FILL — Nortriptyline HCl Cap 10 MG: ORAL | 90 days supply | Qty: 90 | Fill #1 | Status: AC

## 2024-09-29 ENCOUNTER — Other Ambulatory Visit: Payer: Self-pay

## 2024-11-19 ENCOUNTER — Other Ambulatory Visit: Payer: Self-pay

## 2024-11-19 ENCOUNTER — Other Ambulatory Visit (HOSPITAL_COMMUNITY): Payer: Self-pay

## 2024-11-26 ENCOUNTER — Ambulatory Visit: Admitting: Podiatry

## 2024-11-26 DIAGNOSIS — B351 Tinea unguium: Secondary | ICD-10-CM

## 2024-11-26 DIAGNOSIS — Z79899 Other long term (current) drug therapy: Secondary | ICD-10-CM | POA: Diagnosis not present

## 2024-11-26 NOTE — Progress Notes (Signed)
 "  Subjective:  Patient ID: Lindsey Carson, female    DOB: 11/05/1966,  MRN: [id number]  Chief Complaint  Patient presents with   Nail Problem    Fungus follow up     59 y.o. female presents with the above complaint.  Patient presents for follow-up to left hallux onychomycosis she states that is doing better and better.  She has noticed much more improvement.  She has not had any issues with the medication.   Review of Systems: Negative except as noted in the HPI. Denies N/V/F/Ch.  Past Medical History:  Diagnosis Date   Allergy    Anxiety    Arthritis    OA   Chronic allergic rhinitis    Severe, year long   Chronic back pain    HNP - history   Chronic low back pain 01/28/2020   Elevated blood pressure    History of   Facet arthropathy, lumbar 01/28/2020   Headache    Heart murmur    was told but doesn't require meds - never has been a problem   Hemorrhoids    History OF   Hypertension    OFF BP MEDS FOR 6 MONTHS   Joint pain    hands and knees, hips and shoulders   Osteoporosis    PONV (postoperative nausea and vomiting)    Previous back surgery 01/28/2020   Sciatica of right side 01/28/2020   Seasonal allergies    uses nasocort daily and zyrtec   Sinus infection    History of frequent   Spinal fracture YRS AGO   L5/S1   Thyroid  disease     Current Outpatient Medications:    aspirin  EC 81 MG tablet, Take 1 tablet (81 mg total) by mouth daily. (Patient taking differently: Take 81 mg by mouth at bedtime.), Disp: 90 tablet, Rfl: 3   atorvastatin  (LIPITOR) 20 MG tablet, Take 1 tablet (20 mg total) by mouth daily., Disp: 90 tablet, Rfl: 3   cetirizine (ZYRTEC) 10 MG tablet, Take 10 mg by mouth at bedtime. , Disp: , Rfl:    Cholecalciferol (VITAMIN D ) 2000 units CAPS, Take by mouth at bedtime., Disp: , Rfl:    denosumab  (PROLIA ) 60 MG/ML SOSY injection, inject 60mg  Subcutaneous every 6 months (start October 2024), Disp: 1 mL, Rfl: 1   levothyroxine  (SYNTHROID ) 50 MCG  tablet, Take 1 tablet (50 mcg total) by mouth 6 days a week., Disp: 90 tablet, Rfl: 3   Magnesium 250 MG TABS, Take 250 mg by mouth at bedtime., Disp: , Rfl:    metoprolol  succinate (TOPROL -XL) 25 MG 24 hr tablet, Take 1 tablet (25 mg total) by mouth daily., Disp: 30 tablet, Rfl: 12   nortriptyline  (PAMELOR ) 10 MG capsule, Take 1 capsule (10 mg total) by mouth at bedtime., Disp: 90 capsule, Rfl: 3   terbinafine  (LAMISIL ) 250 MG tablet, Take 1 tablet (250 mg total) by mouth daily., Disp: 90 tablet, Rfl: 0   triamcinolone  (NASACORT ) 55 MCG/ACT nasal inhaler, Place 1 spray into the nose daily. , Disp: , Rfl:    valsartan  (DIOVAN ) 160 MG tablet, Take 1 tablet (160 mg total) by mouth daily., Disp: 90 tablet, Rfl: 3   Zoster Vaccine Adjuvanted (SHINGRIX ) injection, Inject 0.5 mLs into the muscle., Disp: 0.5 mL, Rfl: 1  Social History   Tobacco Use  Smoking Status Never  Smokeless Tobacco Never    Allergies  Allergen Reactions   Prednisone      Other Reaction(s): confusion   Lisinopril Cough  Amoxicillin-Pot Clavulanate Diarrhea    Has patient had a PCN reaction causing immediate rash, facial/tongue/throat swelling, SOB or lightheadedness with hypotension: No  Has patient had a PCN reaction causing severe rash involving mucus membranes or skin necrosis: No  Has patient had a PCN reaction that required hospitalization: No  Has patient had a PCN reaction occurring within the last 10 years: Yes  If all of the above answers are NO, then may proceed with Cephalosporin use.  Other Reaction(s): GI intolerance   Latex Rash   Wound Dressing Adhesive Rash   Objective:  There were no vitals filed for this visit. There is no height or weight on file to calculate BMI. Constitutional Well developed. Well nourished.  Vascular Dorsalis pedis pulses palpable bilaterally. Posterior tibial pulses palpable bilaterally. Capillary refill normal to all digits.  No cyanosis or clubbing noted. Pedal  hair growth normal.  Neurologic Normal speech. Oriented to person, place, and time. Epicritic sensation to light touch grossly present bilaterally.  Dermatologic No further nail dystrophy or nail fungus noted to left hallux.  No further pain noted. Skin within normal limits  Orthopedic: Normal joint ROM without pain or crepitus bilaterally. No visible deformities. No bony tenderness.   Radiographs: None Assessment:   No diagnosis found.   Plan:  Patient was evaluated and treated and all questions answered.  Left hallux onychomycosis~third round Clinically healed and nail fungus that improved considerably.  At this time we will continue clinically monitoring it if any foot and ankle issues are future she will come back and see us . -  No follow-ups on file. "

## 2024-12-07 ENCOUNTER — Other Ambulatory Visit (HOSPITAL_COMMUNITY): Payer: Self-pay

## 2024-12-07 ENCOUNTER — Telehealth: Payer: Self-pay

## 2024-12-07 ENCOUNTER — Other Ambulatory Visit: Payer: Self-pay

## 2024-12-07 NOTE — Progress Notes (Signed)
 PA started in new encounter. Status is pending

## 2024-12-07 NOTE — Telephone Encounter (Signed)
 Pharmacy Patient Advocate Encounter   Received notification from Pt Calls Messages that prior authorization for Prolia  is required/requested.   Insurance verification completed.   The patient is insured through [facility].   Per test claim: PA required; PA submitted to above mentioned insurance via Latent Key/confirmation #/EOC [id number] Status is pending

## 2024-12-08 ENCOUNTER — Other Ambulatory Visit: Payer: Self-pay

## 2024-12-10 ENCOUNTER — Other Ambulatory Visit (HOSPITAL_COMMUNITY): Payer: Self-pay

## 2024-12-10 ENCOUNTER — Other Ambulatory Visit: Payer: Self-pay

## 2024-12-11 ENCOUNTER — Other Ambulatory Visit (HOSPITAL_COMMUNITY): Payer: Self-pay

## 2025-03-12 ENCOUNTER — Ambulatory Visit: Admitting: Nurse Practitioner
